# Patient Record
Sex: Female | Born: 1945 | Race: White | Hispanic: No | Marital: Married | Smoking: Never smoker
Health system: Southern US, Community
[De-identification: ages and names within clinical notes are randomized; demographics above are authoritative.]

## PROBLEM LIST (undated history)

## (undated) DIAGNOSIS — K829 Disease of gallbladder, unspecified: Secondary | ICD-10-CM

## (undated) DIAGNOSIS — T4145XA Adverse effect of unspecified anesthetic, initial encounter: Secondary | ICD-10-CM

## (undated) DIAGNOSIS — T8859XA Other complications of anesthesia, initial encounter: Secondary | ICD-10-CM

## (undated) DIAGNOSIS — R351 Nocturia: Secondary | ICD-10-CM

## (undated) DIAGNOSIS — K5790 Diverticulosis of intestine, part unspecified, without perforation or abscess without bleeding: Secondary | ICD-10-CM

## (undated) DIAGNOSIS — M2550 Pain in unspecified joint: Secondary | ICD-10-CM

## (undated) DIAGNOSIS — I480 Paroxysmal atrial fibrillation: Secondary | ICD-10-CM

## (undated) DIAGNOSIS — K5792 Diverticulitis of intestine, part unspecified, without perforation or abscess without bleeding: Secondary | ICD-10-CM

## (undated) DIAGNOSIS — O24419 Gestational diabetes mellitus in pregnancy, unspecified control: Secondary | ICD-10-CM

## (undated) DIAGNOSIS — IMO0002 Reserved for concepts with insufficient information to code with codable children: Secondary | ICD-10-CM

## (undated) DIAGNOSIS — M1990 Unspecified osteoarthritis, unspecified site: Secondary | ICD-10-CM

## (undated) DIAGNOSIS — H409 Unspecified glaucoma: Secondary | ICD-10-CM

## (undated) DIAGNOSIS — I82409 Acute embolism and thrombosis of unspecified deep veins of unspecified lower extremity: Secondary | ICD-10-CM

## (undated) DIAGNOSIS — E7800 Pure hypercholesterolemia, unspecified: Secondary | ICD-10-CM

## (undated) DIAGNOSIS — R7303 Prediabetes: Secondary | ICD-10-CM

## (undated) DIAGNOSIS — Z9889 Other specified postprocedural states: Secondary | ICD-10-CM

## (undated) DIAGNOSIS — K649 Unspecified hemorrhoids: Secondary | ICD-10-CM

## (undated) DIAGNOSIS — Z8632 Personal history of gestational diabetes: Secondary | ICD-10-CM

## (undated) DIAGNOSIS — R112 Nausea with vomiting, unspecified: Secondary | ICD-10-CM

## (undated) DIAGNOSIS — I2510 Atherosclerotic heart disease of native coronary artery without angina pectoris: Secondary | ICD-10-CM

## (undated) DIAGNOSIS — H9190 Unspecified hearing loss, unspecified ear: Secondary | ICD-10-CM

## (undated) DIAGNOSIS — E739 Lactose intolerance, unspecified: Secondary | ICD-10-CM

## (undated) HISTORY — DX: Disease of gallbladder, unspecified: K82.9

## (undated) HISTORY — PX: EYE SURGERY: SHX253

## (undated) HISTORY — PX: GLAUCOMA SURGERY: SHX656

## (undated) HISTORY — PX: OTHER SURGICAL HISTORY: SHX169

## (undated) HISTORY — DX: Lactose intolerance, unspecified: E73.9

## (undated) HISTORY — DX: Unspecified hearing loss, unspecified ear: H91.90

## (undated) HISTORY — DX: Paroxysmal atrial fibrillation: I48.0

## (undated) HISTORY — DX: Prediabetes: R73.03

## (undated) HISTORY — DX: Diverticulitis of intestine, part unspecified, without perforation or abscess without bleeding: K57.92

## (undated) HISTORY — PX: BLADDER SURGERY: SHX569

## (undated) HISTORY — DX: Atherosclerotic heart disease of native coronary artery without angina pectoris: I25.10

## (undated) HISTORY — DX: Pain in unspecified joint: M25.50

## (undated) HISTORY — DX: Gestational diabetes mellitus in pregnancy, unspecified control: O24.419

## (undated) HISTORY — DX: Pure hypercholesterolemia, unspecified: E78.00

## (undated) HISTORY — DX: Unspecified osteoarthritis, unspecified site: M19.90

---

## 1999-09-26 HISTORY — PX: BREAST BIOPSY: SHX20

## 2002-09-25 HISTORY — PX: OTHER SURGICAL HISTORY: SHX169

## 2003-03-16 ENCOUNTER — Encounter: Payer: Self-pay | Admitting: Family Medicine

## 2003-03-16 ENCOUNTER — Encounter: Admission: RE | Admit: 2003-03-16 | Discharge: 2003-03-16 | Payer: Self-pay | Admitting: Family Medicine

## 2003-09-16 ENCOUNTER — Encounter: Admission: RE | Admit: 2003-09-16 | Discharge: 2003-12-15 | Payer: Self-pay | Admitting: Family Medicine

## 2004-07-19 ENCOUNTER — Encounter: Admission: RE | Admit: 2004-07-19 | Discharge: 2004-07-19 | Payer: Self-pay | Admitting: Family Medicine

## 2004-10-18 ENCOUNTER — Encounter: Admission: RE | Admit: 2004-10-18 | Discharge: 2005-01-16 | Payer: Self-pay | Admitting: Family Medicine

## 2005-02-14 ENCOUNTER — Encounter: Admission: RE | Admit: 2005-02-14 | Discharge: 2005-05-15 | Payer: Self-pay | Admitting: Family Medicine

## 2005-06-23 ENCOUNTER — Encounter: Admission: RE | Admit: 2005-06-23 | Discharge: 2005-09-21 | Payer: Self-pay | Admitting: Family Medicine

## 2005-08-09 ENCOUNTER — Other Ambulatory Visit: Admission: RE | Admit: 2005-08-09 | Discharge: 2005-08-09 | Payer: Self-pay | Admitting: Family Medicine

## 2005-11-15 ENCOUNTER — Other Ambulatory Visit: Admission: RE | Admit: 2005-11-15 | Discharge: 2005-11-15 | Payer: Self-pay | Admitting: Diagnostic Radiology

## 2005-11-30 ENCOUNTER — Inpatient Hospital Stay (HOSPITAL_COMMUNITY): Admission: RE | Admit: 2005-11-30 | Discharge: 2005-12-01 | Payer: Self-pay | Admitting: Neurological Surgery

## 2007-08-12 ENCOUNTER — Other Ambulatory Visit: Admission: RE | Admit: 2007-08-12 | Discharge: 2007-08-12 | Payer: Self-pay | Admitting: Family Medicine

## 2008-05-20 ENCOUNTER — Encounter: Admission: RE | Admit: 2008-05-20 | Discharge: 2008-06-03 | Payer: Self-pay | Admitting: Family Medicine

## 2008-06-02 ENCOUNTER — Emergency Department (HOSPITAL_BASED_OUTPATIENT_CLINIC_OR_DEPARTMENT_OTHER): Admission: EM | Admit: 2008-06-02 | Discharge: 2008-06-02 | Payer: Self-pay | Admitting: Emergency Medicine

## 2010-02-28 ENCOUNTER — Encounter: Admission: RE | Admit: 2010-02-28 | Discharge: 2010-02-28 | Payer: Self-pay | Admitting: Family Medicine

## 2010-03-23 ENCOUNTER — Other Ambulatory Visit: Admission: RE | Admit: 2010-03-23 | Discharge: 2010-03-23 | Payer: Self-pay | Admitting: Diagnostic Radiology

## 2010-03-23 ENCOUNTER — Encounter: Admission: RE | Admit: 2010-03-23 | Discharge: 2010-03-23 | Payer: Self-pay | Admitting: Internal Medicine

## 2010-09-22 ENCOUNTER — Encounter
Admission: RE | Admit: 2010-09-22 | Discharge: 2010-09-22 | Payer: Self-pay | Source: Home / Self Care | Attending: Internal Medicine | Admitting: Internal Medicine

## 2010-10-16 ENCOUNTER — Encounter: Payer: Self-pay | Admitting: Family Medicine

## 2011-02-10 NOTE — Op Note (Signed)
NAMEBONNEE, Maureen Elliott             ACCOUNT NO.:  [id number]   MEDICAL RECORD NO.:  [id number]          PATIENT TYPE:  INP   LOCATION:  3016                         FACILITY:  [facility]   PHYSICIAN:  Maureen Elliott, M.D.  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   OPERATION:  Anterior cervical decompression C5-6 and C6-7, strut arthrodesis  with structural cage of PEEK with allograft, Alphatec plate fixation.   SURGEON:  Maureen Elliott, M.D.   FIRST ASSISTANT:  Coletta Memos, M.D.   ANESTHESIA:  General endotracheal.   INDICATIONS:  Maureen Elliott is a 65 year old individual who has had  significant neck, shoulder, and left arm pain.  She has failed efforts at  conservative management and over the passage of time has had persistent  worsening symptoms of the neck, shoulder, left arm. She has been advised  regarding surgical decompression and arthrodesis at the C5-C6 level where  she has substantial spondylitic changes  and laterally herniated disc at C6-  C7 level.   PROCEDURE:  The patient was brought to the operating room, placed on table  supine position. After the smooth induction of general endotracheal  anesthesia, she was placed in 5 pounds of halter traction. The neck was  placed in a slightly extended position with appropriate padding to all bony  prominences and the arms were at the side. The neck was prepped with  DuraPrep and draped in sterile fashion. A transverse incision was made in  the left sided neck. This was carried down through the platysma.  The plane  between the sternocleidomastoid and strap muscles was dissected bluntly  until the prevertebral space was reached.  The first  identifiable disc space  was noted to be that of C6-C7 with a localizing radiograph.  Dissection was  then carried slightly cephalad to expose C5-C6 and the longus coli muscle  was then dissected from either side of the midline structures so as to allow  placement of a self-retaining retractor of the Caspar type.  With the  retractor in place, then, attention was turned first to C5-C6 which was the  worst of the two levels and the disc space was opened with 15 blade.  Calcified anterior longitudinal ligament was encountered and this was  removed in a piecemeal fashion. The disc was markedly degenerated and  desiccated and this was removed using a combination of curets and rongeurs  until the region of posterior longitudinal ligament was reached. A  substantial osteophyte from the inferior margin body of C5 was encountered  and this was drilled down with a 2.3 mm dissecting tool and high speed  drill. The portions of the osteophyte were removed and the dissection was  then carried out to the lateral gutters, particularly on the left side where  there was noted be calcification in the posterior longitudinal ligament  itself. This was taken up carefully with first a 1 mm Kerrison punch and  then a 2 mm Kerrison punch which could be slipped under the posterior  longitudinal ligament to clear the prevertebral space. Dissection was  carried out all the way into the lateral recess following the path of the C6  nerve root. Care was taken to decompress this and epidural venous bleeding  was controlled with bipolar cautery and some very small pledgets of Gelfoam  soaked in thrombin, which were later irrigated away. Minimal decompression  was performed on the right side as this was completely asymptomatic. Once  this was accomplished, care was taken to make sure that both endplates were  decorticated adequately and no remnants of cartilaginous material was  adherent to then.  Then, attention was  turned to C6-C7 where a similar  procedure was carried out. Here, there was noted be a chronically herniated  disc in the left lateral recess with small bony cover, this was taken out on  the left side, the C7 nerve root was decompressed in a similar fashion.  Again, on the right side a minimal decompression was performed. With the  disc space being completely evacuated and the prevertebral space being  decompressed well, hemostasis was achieved from all the epidural spaces and  then the interspaces were sized for a PEEK spacer. A 6 mm PEEK spacer was  used at each of these sites. This was of medium sized.  It was packed with  demineralized bone matrix and this was placed into the interspace at C6-C7  first and then at C5-C6 next.  A singular localizing radiograph identified  the position of the grafts, they were repositioned more adequately to be  central and filling the center part of the interspace and then a 37 mm  standard size Alphatec plate was fitted to the ventral aspect of the  vertebral bodies with six locking 4 x 14 mm screws.  The screws in C6 were  fixed angle and variable angle screws were used in C5 and C7. In the end,  hemostasis in the wound was obtained meticulously in all the soft tissues.  Once this was verified, the platysma was closed with 3-0 Vicryl in  interrupted fashion, 3-0 Vicryl was used in the subcuticular tissues.  Dermabond was placed on the skin. The patient tolerated procedure well and  was returned to recovery in stable condition.      Maureen Elliott, M.D.  Electronically Signed     HJE/MEDQ  D:  11/30/2005  T:  11/30/2005  Job:  [id number]

## 2011-09-26 DIAGNOSIS — Z9889 Other specified postprocedural states: Secondary | ICD-10-CM

## 2011-09-26 DIAGNOSIS — R112 Nausea with vomiting, unspecified: Secondary | ICD-10-CM

## 2011-09-26 HISTORY — PX: ABDOMINAL HYSTERECTOMY: SHX81

## 2011-09-26 HISTORY — DX: Nausea with vomiting, unspecified: R11.2

## 2011-09-26 HISTORY — DX: Other specified postprocedural states: Z98.890

## 2011-09-29 ENCOUNTER — Other Ambulatory Visit: Payer: Self-pay | Admitting: Internal Medicine

## 2011-09-29 DIAGNOSIS — E042 Nontoxic multinodular goiter: Secondary | ICD-10-CM

## 2011-10-09 ENCOUNTER — Other Ambulatory Visit: Payer: Self-pay

## 2011-10-18 ENCOUNTER — Ambulatory Visit
Admission: RE | Admit: 2011-10-18 | Discharge: 2011-10-18 | Disposition: A | Discharge status: Home / Self Care | Payer: BC Managed Care – PPO | Source: Ambulatory Visit | Attending: Internal Medicine | Admitting: Internal Medicine

## 2011-10-18 DIAGNOSIS — E042 Nontoxic multinodular goiter: Secondary | ICD-10-CM

## 2013-10-26 HISTORY — PX: OTHER SURGICAL HISTORY: SHX169

## 2013-12-08 ENCOUNTER — Other Ambulatory Visit: Payer: Self-pay | Admitting: Urology

## 2013-12-25 ENCOUNTER — Encounter (HOSPITAL_COMMUNITY): Payer: Self-pay | Admitting: Pharmacy Technician

## 2014-01-02 NOTE — Patient Instructions (Addendum)
20 Marliss CootsLinda L Crall  01/02/2014   Your procedure is scheduled on: Tuesday April 21st, 2015  Report to [facility] at  530AM.  Call this number if you have problems the morning of surgery [phone]   Remember: short stay will draw your blood  type morning of surgery.  Do not eat food or drink liquids :After Midnight.     Take these medicines the morning of surgery with A SIP OF WATER:  No meds to take                               You may not have any metal on your body including hair pins and piercings  Do not wear jewelry, make-up, lotions, powders, or deodorant.   Men may shave face and neck.  Do not bring valuables to the hospital. [facility] IS NOT RESPONSIBLE FOR VALUABLES.  Contacts, dentures or bridgework may not be worn into surgery.  Leave suitcase in the car. After surgery it may be brought to your room.  For patients admitted to the hospital, checkout time is 11:00 AM the day of discharge.   Patients discharged the day of surgery will not be allowed to drive home.  Name and phone number of your driver:  Special Instructions: N/A  [facility] - Preparing for Surgery Before surgery, you can play an important role.  Because skin is not sterile, your skin needs to be as free of germs as possible.  You can reduce the number of germs on your skin by washing with CHG (chlorahexidine gluconate) soap before surgery.  CHG is an antiseptic cleaner which kills germs and bonds with the skin to continue killing germs even after washing. Please DO NOT use if you have an allergy to CHG or antibacterial soaps.  If your skin becomes reddened/irritated stop using the CHG and inform your nurse when you arrive at Short Stay. Do not shave (including legs and underarms) for at least 48 hours prior to the first CHG shower.  You may shave your face. Please follow these instructions carefully:  1.  Shower with CHG Soap the night before surgery and the  morning of Surgery.  2.   If you choose to wash your hair, wash your hair first as usual with your  normal  shampoo.  3.  After you shampoo, rinse your hair and body thoroughly to remove the  shampoo.                           4.  Use CHG as you would any other liquid soap.  You can apply chg directly  to the skin and wash                       Gently with a scrungie or clean washcloth.  5.  Apply the CHG Soap to your body ONLY FROM THE NECK DOWN.   Do not use on open                           Wound or open sores. Avoid contact with eyes, ears mouth and genitals (private parts).                        Genitals (private parts) with your normal soap.  6.  Wash thoroughly, paying special attention to the area where your surgery  will be performed.  7.  Thoroughly rinse your body with warm water from the neck down.  8.  DO NOT shower/wash with your normal soap after using and rinsing off  the CHG Soap.                9.  Pat yourself dry with a clean towel.            10.  Wear clean pajamas.            11.  Place clean sheets on your bed the night of your first shower and do not  sleep with pets. Day of Surgery : Do not apply any lotions/deodorants the morning of surgery.  Please wear clean clothes to the hospital/surgery center.  FAILURE TO FOLLOW THESE INSTRUCTIONS MAY RESULT IN THE CANCELLATION OF YOUR SURGERY PATIENT SIGNATURE_________________________________  NURSE SIGNATURE__________________________________  WHAT IS A BLOOD TRANSFUSION? Blood Transfusion Information  A transfusion is the replacement of blood or some of its parts. Blood is made up of multiple cells which provide different functions.  Red blood cells carry oxygen and are used for blood loss replacement.  White blood cells fight against infection.  Platelets control bleeding.  Plasma helps clot blood.  Other blood products are available for specialized needs, such as hemophilia or other clotting disorders. BEFORE THE TRANSFUSION   Who gives blood for transfusions?   Healthy volunteers who are fully evaluated to make sure their blood is safe. This is blood bank blood. Transfusion therapy is the safest it has ever been in the practice of medicine. Before blood is taken from a donor, a complete history is taken to make sure that person has no history of diseases nor engages in risky social behavior (examples are intravenous drug use or sexual activity with multiple partners). The donor's travel history is screened to minimize risk of transmitting infections, such as malaria. The donated blood is tested for signs of infectious diseases, such as HIV and hepatitis. The blood is then tested to be sure it is compatible with you in order to minimize the chance of a transfusion reaction. If you or a relative donates blood, this is often done in anticipation of surgery and is not appropriate for emergency situations. It takes many days to process the donated blood. RISKS AND COMPLICATIONS Although transfusion therapy is very safe and saves many lives, the main dangers of transfusion include:   Getting an infectious disease.  Developing a transfusion reaction. This is an allergic reaction to something in the blood you were given. Every precaution is taken to prevent this. The decision to have a blood transfusion has been considered carefully by your caregiver before blood is given. Blood is not given unless the benefits outweigh the risks. AFTER THE TRANSFUSION  Right after receiving a blood transfusion, you will usually feel much better and more energetic. This is especially true if your red blood cells have gotten low (anemic). The transfusion raises the level of the red blood cells which carry oxygen, and this usually causes an energy increase.  The nurse administering the transfusion will monitor you carefully for complications. HOME CARE INSTRUCTIONS  No special instructions are needed after a transfusion. You may find your energy  is better. Speak with your caregiver about any limitations on activity for underlying diseases you may have. SEEK MEDICAL CARE IF:   Your condition is not improving after your transfusion.  You develop redness or irritation at the intravenous (IV) site. SEEK IMMEDIATE MEDICAL CARE IF:  Any of the following symptoms occur over the next 12 hours:  Shaking chills.  You have a temperature by mouth above 102 F (38.9 C), not controlled by medicine.  Chest, back, or muscle pain.  People around you feel you are not acting correctly or are confused.  Shortness of breath or difficulty breathing.  Dizziness and fainting.  You get a rash or develop hives.  You have a decrease in urine output.  Your urine turns a dark color or changes to pink, red, or brown. Any of the following symptoms occur over the next 10 days:  You have a temperature by mouth above 102 F (38.9 C), not controlled by medicine.  Shortness of breath.  Weakness after normal activity.  The white part of the eye turns yellow (jaundice).  You have a decrease in the amount of urine or are urinating less often.  Your urine turns a dark color or changes to pink, red, or brown. Document Released: 09/08/2000 Document Revised: 12/04/2011 Document Reviewed: 04/27/2008 [facility] Patient Information 2014 [place], [place].

## 2014-01-05 ENCOUNTER — Encounter (HOSPITAL_COMMUNITY)
Admission: RE | Admit: 2014-01-05 | Discharge: 2014-01-05 | Disposition: A | Discharge status: Home / Self Care | Payer: Medicare Other | Source: Ambulatory Visit | Attending: Urology | Admitting: Urology

## 2014-01-05 ENCOUNTER — Encounter (INDEPENDENT_AMBULATORY_CARE_PROVIDER_SITE_OTHER): Payer: Self-pay

## 2014-01-05 ENCOUNTER — Encounter (HOSPITAL_COMMUNITY): Payer: Self-pay

## 2014-01-05 DIAGNOSIS — Z01812 Encounter for preprocedural laboratory examination: Secondary | ICD-10-CM | POA: Insufficient documentation

## 2014-01-05 DIAGNOSIS — N8111 Cystocele, midline: Secondary | ICD-10-CM | POA: Insufficient documentation

## 2014-01-05 HISTORY — DX: Nausea with vomiting, unspecified: R11.2

## 2014-01-05 HISTORY — DX: Other complications of anesthesia, initial encounter: T88.59XA

## 2014-01-05 HISTORY — DX: Other specified postprocedural states: Z98.890

## 2014-01-05 HISTORY — DX: Unspecified hemorrhoids: K64.9

## 2014-01-05 HISTORY — DX: Nocturia: R35.1

## 2014-01-05 HISTORY — DX: Adverse effect of unspecified anesthetic, initial encounter: T41.45XA

## 2014-01-05 HISTORY — DX: Reserved for concepts with insufficient information to code with codable children: IMO0002

## 2014-01-05 HISTORY — DX: Diverticulosis of intestine, part unspecified, without perforation or abscess without bleeding: K57.90

## 2014-01-05 HISTORY — DX: Personal history of gestational diabetes: Z86.32

## 2014-01-05 LAB — CBC
HCT: 39 % (ref 36.0–46.0)
Hemoglobin: 12.9 g/dL (ref 12.0–15.0)
MCH: 30 pg (ref 26.0–34.0)
MCHC: 33.1 g/dL (ref 30.0–36.0)
MCV: 90.7 fL (ref 78.0–100.0)
Platelets: 234 10*3/uL (ref 150–400)
RBC: 4.3 MIL/uL (ref 3.87–5.11)
RDW: 14.1 % (ref 11.5–15.5)
WBC: 4.3 10*3/uL (ref 4.0–10.5)

## 2014-01-05 LAB — APTT: aPTT: 26 seconds (ref 24–37)

## 2014-01-05 LAB — PROTIME-INR
INR: 0.94 (ref 0.00–1.49)
Prothrombin Time: 12.4 seconds (ref 11.6–15.2)

## 2014-01-12 MED ORDER — GENTAMICIN SULFATE 40 MG/ML IJ SOLN
360.0000 mg | INTRAVENOUS | Status: AC
  Administered 2014-01-13: 360 mg via INTRAVENOUS
  Filled 2014-01-12 (×2): qty 9

## 2014-01-12 NOTE — H&P (Signed)
History of Present Illness   [name] has a symptomatic cystocele. She has milder stress incontinence and urge incontinence, but also has fecal incontinence. She has moderate frequency and mild nocturia. She had a neck injury. She can feel vaginal bulging. She had a hysterectomy 3 years ago. She had a rectocele repair and a cystocele occurred following her hysterectomy. On pelvic examination she had a grade 3 cystocele with central defect with significant loss of apical support. The apex reached the introitus and _____ she lost approximately 4 cm posteriorly. She had no stress incontinence associated with grade 2 hypermobility of the bladder neck. With the vault and cystocele reduced, she had a mild rectocele that was reduced and mild deficiency of the posterior fourchette. She had some mild tenderness and atrophy. Her residual was 122 mL. She has not had a cystoscopy. I thought if she had surgery, she likely best benefit from a transvaginal vault suspension with cystocele repair and graft and I did not think she would require a rectocele repair. Local estrogen replacement therapy would be recommended prior to surgery. She noted that mesh was used for a rectocele repair. She is following up with Dr. Randa EvensEdwards for fecal incontinence.   She did not void and was catheterized for 100 mL. Maximum capacity was 362 mL. Bladder was unstable reaching a pressure of 10 cmH2O, but she did not leak. At 200 mL, she had very mild leakage at 89 cmH2O. It was 79 cmH2O and very mild at 300 mL. This is with the prolapse reduced. The leakage was not impressive. During voluntary voiding, she voided 270 mL with a maximum flow of 21 mL/sec. Maximum voiding pressure was 21 cmH2O. Residual was 90 mL. She said her flow is better with the pack in place. EMG activity increased during the voiding phase. Bladder neck descended 2-3 cm and she had a moderate cystocele as noted, but was reduced. Flow pattern was interrupted and bladder  contraction was reasonably well sustained but overall of lower pressure. Steward DroneBrenda thought the contraction was not that well sustained. The details of the urodynamics are signed and dictated on the urodynamic sheet.   Review of Systems: No other change in bowel or neurologic systems.    Past Medical History Problems  1. History of Rectal bleeding (569.3)  Surgical History Problems  1. History of Abdominoplasty 2. History of Cataract Surgery 3. History of Cholecystectomy 4. History of Hysterectomy 5. History of Knee Arthroscopy 6. History of Oral Surgery 7. History of Rectocele Repair 8. History of Spinal Arthrodesis 9. History of Tonsillectomy  Current Meds 1. Calcium + D TABS;  Therapy: (Recorded:13Jan2015) to Recorded 2. CholestOff TABS;  Therapy: (Recorded:13Jan2015) to Recorded 3. Multi-Vitamin TABS;  Therapy: (Recorded:13Jan2015) to Recorded 4. Vitamin D3 1000 UNIT Oral Capsule;  Therapy: (Recorded:13Jan2015) to Recorded  Allergies Medication  1. Amoxicillin TABS 2. Keflex TABS 3. Levaquin TABS  Family History Problems  1. Family history of alcoholism (V17.0) : Father 2. Family history of colon cancer (V16.0) : Uncle 3. Family history of lung cancer (V16.1) : Father 4. Family history of malignant neoplasm of breast (V16.3) : Aunt 5. Family history of malignant neoplasm of stomach (V16.0) : Maternal Grandmother 6. Family history of ovarian cancer (V16.41) : Mother 7. Family history of stroke (V17.1) : Daughter, Paternal Grandmother 8. Family history of S/P triple vessel bypass : Brother  Social History Problems  1. Alcohol use   occasional - one per week 2. Daily caffeine consumption, 4-5 servings a day 3.  Death in the family, father   age 67 due to lung cancer and alcoholism 4. Death in the family, mother   age 67 due to old age and dementia 5. Married 6. Non-smoker (V49.89) 7. One child   daughter 338. Retired   Solicitormedical assistant  instructor  Assessment Assessed  1. Incomplete bladder emptying (788.21) 2. Cystocele, midline (618.01)  Plan Cystocele, midline  1. Follow-up Schedule Surgery Office  Follow-up  Status: Complete  Done: 11Feb2015 Nocturia  2. Start: Premarin 0.625 MG/GM Vaginal Cream; one gram 3x/week x 4 weeks; then once  weekly;  Discussion/Summary   I drew [name] a picture. I reviewed watchful waiting versus pessary versus a transvaginal vault suspension with cystocele repair and graft.  I drew her a picture and we talked about prolapse surgery in detail. Pros, cons, general surgical and anesthetic risks, and other options including behavioral therapy, pessaries, and watchful waiting were discussed. She understands that prolapse repairs are successful in 80-85% of cases for prolapse symptoms and can recur anteriorly, posteriorly, and/or apically. She understands that in most cases I use a graft and general risks were discussed. Surgical risks were described but not limited to the discussion of injury to neighboring structures including the bowel (with possible life-threatening sepsis and colostomy), bladder, urethra, vagina (all resulting in further surgery), and ureter (resulting in re-implantation). We talked about injury to nerves/soft tissue leading to debilitating and intractable pelvic, abdominal, and lower extremity pain syndromes and neuropathies. The risks of buttock pain, intractable dyspareunia, and vaginal narrowing and shortening with sequelae were discussed. Bleeding risks, transfusion rates, and infection were discussed. The risk of persistent, de novo, or worsening bladder and/or bowel incontinence/dysfunction was discussed. The need for CIC was described as well the usual post-operative course. The patient understands that she might not reach her treatment goal and that she might be worse following surgery.  Mesh issues discussed.  I talked to her about watchful waiting versus  physical therapy versus sling for her mild mixed stress urge incontinence.   We talked about a sling in detail. Pros, cons, general surgical and anesthetic risks, and other options including behavioral therapy and watchful waiting were discussed. She understands that slings are generally successful in 90% of cases for stress incontinence, 50% for urge incontinence, and that in a small percentage of cases the incontinence can worsen. The risk of persistent, de novo, or worsening incontinence/dysfunction was discussed. Risks were described but not limited to the discussion of injury to neighboring structures including the bowel (with possible life-threatening sepsis and colostomy), bladder, urethra, vagina (all resulting in further surgery), and ureter (resulting in re-implantation). We also talked about the risk of retention requiring urethrolysis, extrusion requiring revision, and erosion resulting in further surgery. Bleeding risks and transfusion rates and the risk of infection were discussed. The risk of pelvic and abdominal pain syndromes, dyspareunia, and neuropathies were discussed. The need for CIC was described as well as the usual postoperative course. The patient understands that she might not reach her treatment goal and that she might be worse following surgery. Mesh TV issues were discussed.  Mesh issues discussed.  Treatment goals were ____ that I recommended not to have surgery if her primary complaint was incontinence, since I would try to help her with medical and behavioral therapy and/or physical therapy first.   [name] was cleared with a colonoscopy by Dr. Randa EvensEdwards. He thinks she has low sphincter tone and wants to see her post prolapse surgery.  Theoretical slight  increased risk of retention was also discussed.   She would like to proceed with prolapse surgery and a sling.   Estrace prescription given.   [name] has had breast cancer in the past but has been cleared  for Premarin cream before. She will use it twice a week until surgery.  After a thorough review of the management options for the patient's condition the patient  elected to proceed with surgical therapy as noted above. We have discussed the potential benefits and risks of the procedure, side effects of the proposed treatment, the likelihood of the patient achieving the goals of the procedure, and any potential problems that might occur during the procedure or recuperation. Informed consent has been obtained.

## 2014-01-13 ENCOUNTER — Encounter (HOSPITAL_COMMUNITY): Payer: Self-pay | Admitting: *Deleted

## 2014-01-13 ENCOUNTER — Encounter (HOSPITAL_COMMUNITY): Payer: Medicare Other | Admitting: Registered Nurse

## 2014-01-13 ENCOUNTER — Observation Stay (HOSPITAL_COMMUNITY)
Admission: RE | Admit: 2014-01-13 | Discharge: 2014-01-14 | Disposition: A | Discharge status: Home / Self Care | Payer: Medicare Other | Source: Ambulatory Visit | Attending: Urology | Admitting: Urology

## 2014-01-13 ENCOUNTER — Ambulatory Visit (HOSPITAL_COMMUNITY): Payer: Medicare Other | Admitting: Registered Nurse

## 2014-01-13 ENCOUNTER — Encounter (HOSPITAL_COMMUNITY)
Admission: RE | Disposition: A | Discharge status: Home / Self Care | Payer: Self-pay | Source: Ambulatory Visit | Attending: Urology

## 2014-01-13 DIAGNOSIS — R159 Full incontinence of feces: Secondary | ICD-10-CM | POA: Insufficient documentation

## 2014-01-13 DIAGNOSIS — N3946 Mixed incontinence: Secondary | ICD-10-CM | POA: Insufficient documentation

## 2014-01-13 DIAGNOSIS — IMO0002 Reserved for concepts with insufficient information to code with codable children: Secondary | ICD-10-CM | POA: Diagnosis present

## 2014-01-13 DIAGNOSIS — Z9089 Acquired absence of other organs: Secondary | ICD-10-CM | POA: Insufficient documentation

## 2014-01-13 DIAGNOSIS — N993 Prolapse of vaginal vault after hysterectomy: Principal | ICD-10-CM | POA: Insufficient documentation

## 2014-01-13 DIAGNOSIS — R339 Retention of urine, unspecified: Secondary | ICD-10-CM | POA: Insufficient documentation

## 2014-01-13 HISTORY — PX: CYSTOCELE REPAIR: SHX163

## 2014-01-13 LAB — TYPE AND SCREEN
ABO/RH(D): AB POS
Antibody Screen: NEGATIVE

## 2014-01-13 LAB — HEMOGLOBIN AND HEMATOCRIT, BLOOD
HCT: 34.8 % — ABNORMAL LOW (ref 36.0–46.0)
Hemoglobin: 11.7 g/dL — ABNORMAL LOW (ref 12.0–15.0)

## 2014-01-13 LAB — ABO/RH: ABO/RH(D): AB POS

## 2014-01-13 SURGERY — COLPORRHAPHY, ANTERIOR, FOR CYSTOCELE REPAIR
Anesthesia: General

## 2014-01-13 MED ORDER — PROMETHAZINE HCL 25 MG/ML IJ SOLN
INTRAMUSCULAR | Status: AC
  Filled 2014-01-13: qty 1

## 2014-01-13 MED ORDER — METRONIDAZOLE IN NACL 5-0.79 MG/ML-% IV SOLN
INTRAVENOUS | Status: AC
  Filled 2014-01-13: qty 100

## 2014-01-13 MED ORDER — HYDROCODONE-ACETAMINOPHEN 5-325 MG PO TABS: 1.0000 | ORAL_TABLET | ORAL | Status: DC | PRN

## 2014-01-13 MED ORDER — LACTATED RINGERS IV SOLN
INTRAVENOUS | Status: DC | PRN
  Administered 2014-01-13: 10:00:00 via INTRAVENOUS
  Administered 2014-01-13: 1000 mL
  Administered 2014-01-13 (×2): via INTRAVENOUS

## 2014-01-13 MED ORDER — METRONIDAZOLE IN NACL 5-0.79 MG/ML-% IV SOLN
500.0000 mg | Freq: Three times a day (TID) | INTRAVENOUS | Status: DC
  Administered 2014-01-13: .5 g via INTRAVENOUS

## 2014-01-13 MED ORDER — DEXAMETHASONE SODIUM PHOSPHATE 10 MG/ML IJ SOLN
INTRAMUSCULAR | Status: DC | PRN
  Administered 2014-01-13: 10 mg via INTRAVENOUS

## 2014-01-13 MED ORDER — ESTRADIOL 0.1 MG/GM VA CREA
TOPICAL_CREAM | VAGINAL | Status: DC | PRN
  Administered 2014-01-13: 1 via VAGINAL

## 2014-01-13 MED ORDER — FENTANYL CITRATE 0.05 MG/ML IJ SOLN: 25.0000 ug | INTRAMUSCULAR | Status: DC | PRN

## 2014-01-13 MED ORDER — LIDOCAINE-EPINEPHRINE (PF) 1 %-1:200000 IJ SOLN
INTRAMUSCULAR | Status: DC | PRN
  Administered 2014-01-13: 20 mL

## 2014-01-13 MED ORDER — ACETAMINOPHEN 10 MG/ML IV SOLN
INTRAVENOUS | Status: DC | PRN
  Administered 2014-01-13: 1000 mg via INTRAVENOUS

## 2014-01-13 MED ORDER — HYDROCODONE-ACETAMINOPHEN 5-325 MG PO TABS: 1.0000 | ORAL_TABLET | Freq: Four times a day (QID) | ORAL | Status: DC | PRN

## 2014-01-13 MED ORDER — DEXTROSE 10 % IV SOLN
Freq: Once | INTRAVENOUS | Status: DC
Start: 2014-01-13 — End: 2014-01-13
  Filled 2014-01-13: qty 1000

## 2014-01-13 MED ORDER — ROCURONIUM BROMIDE 100 MG/10ML IV SOLN
INTRAVENOUS | Status: DC | PRN
  Administered 2014-01-13: 5 mg via INTRAVENOUS

## 2014-01-13 MED ORDER — MIDAZOLAM HCL 5 MG/5ML IJ SOLN
INTRAMUSCULAR | Status: DC | PRN
  Administered 2014-01-13: 2 mg via INTRAVENOUS

## 2014-01-13 MED ORDER — PROPOFOL 10 MG/ML IV BOLUS
INTRAVENOUS | Status: DC | PRN
  Administered 2014-01-13: 40 mg via INTRAVENOUS
  Administered 2014-01-13: 160 mg via INTRAVENOUS

## 2014-01-13 MED ORDER — FENTANYL CITRATE 0.05 MG/ML IJ SOLN
INTRAMUSCULAR | Status: DC | PRN
Start: 2014-01-13 — End: 2014-01-13
  Administered 2014-01-13: 25 ug via INTRAVENOUS
  Administered 2014-01-13 (×4): 100 ug via INTRAVENOUS
  Administered 2014-01-13: 25 ug via INTRAVENOUS

## 2014-01-13 MED ORDER — FENTANYL CITRATE 0.05 MG/ML IJ SOLN
INTRAMUSCULAR | Status: AC
  Filled 2014-01-13: qty 5

## 2014-01-13 MED ORDER — MORPHINE SULFATE 2 MG/ML IJ SOLN: 2.0000 mg | INTRAMUSCULAR | Status: DC | PRN

## 2014-01-13 MED ORDER — DEXTROSE-NACL 5-0.45 % IV SOLN
INTRAVENOUS | Status: DC
  Administered 2014-01-13 – 2014-01-14 (×3): via INTRAVENOUS

## 2014-01-13 MED ORDER — ACETAMINOPHEN 325 MG PO TABS: 650.0000 mg | ORAL_TABLET | ORAL | Status: DC | PRN

## 2014-01-13 MED ORDER — HYDROMORPHONE HCL PF 1 MG/ML IJ SOLN
INTRAMUSCULAR | Status: AC
  Filled 2014-01-13: qty 1

## 2014-01-13 MED ORDER — PROMETHAZINE HCL 25 MG/ML IJ SOLN
INTRAMUSCULAR | Status: DC | PRN
  Administered 2014-01-13: 6.25 mg via INTRAVENOUS

## 2014-01-13 MED ORDER — ESTRADIOL 0.1 MG/GM VA CREA
TOPICAL_CREAM | VAGINAL | Status: AC
  Filled 2014-01-13: qty 85

## 2014-01-13 MED ORDER — EPHEDRINE SULFATE 50 MG/ML IJ SOLN
INTRAMUSCULAR | Status: DC | PRN
  Administered 2014-01-13: 5 mg via INTRAVENOUS

## 2014-01-13 MED ORDER — ONDANSETRON HCL 4 MG/2ML IJ SOLN
INTRAMUSCULAR | Status: DC | PRN
  Administered 2014-01-13: 4 mg via INTRAVENOUS

## 2014-01-13 MED ORDER — ONDANSETRON HCL 4 MG/2ML IJ SOLN: 4.0000 mg | INTRAMUSCULAR | Status: DC | PRN

## 2014-01-13 MED ORDER — LACTATED RINGERS IV SOLN
INTRAVENOUS | Status: DC
  Administered 2014-01-13: 11:00:00 via INTRAVENOUS

## 2014-01-13 MED ORDER — SODIUM CHLORIDE 0.9 % IR SOLN
Status: DC | PRN
  Administered 2014-01-13: 09:00:00

## 2014-01-13 MED ORDER — ONDANSETRON HCL 4 MG/2ML IJ SOLN
INTRAMUSCULAR | Status: AC
  Filled 2014-01-13: qty 2

## 2014-01-13 MED ORDER — PROPOFOL 10 MG/ML IV BOLUS
INTRAVENOUS | Status: AC
  Filled 2014-01-13: qty 20

## 2014-01-13 MED ORDER — METHYLENE BLUE 1 % INJ SOLN
INTRAMUSCULAR | Status: DC | PRN
Start: 2014-01-13 — End: 2014-01-13
  Administered 2014-01-13: 10 mL via INTRAVENOUS

## 2014-01-13 MED ORDER — DEXAMETHASONE SODIUM PHOSPHATE 10 MG/ML IJ SOLN
INTRAMUSCULAR | Status: AC
  Filled 2014-01-13: qty 1

## 2014-01-13 MED ORDER — MIDAZOLAM HCL 2 MG/2ML IJ SOLN
INTRAMUSCULAR | Status: AC
  Filled 2014-01-13: qty 2

## 2014-01-13 MED ORDER — METHYLENE BLUE 1 % INJ SOLN
INTRAMUSCULAR | Status: AC
  Filled 2014-01-13: qty 10

## 2014-01-13 MED ORDER — SUCCINYLCHOLINE CHLORIDE 20 MG/ML IJ SOLN
INTRAMUSCULAR | Status: DC | PRN
  Administered 2014-01-13: 100 mg via INTRAVENOUS

## 2014-01-13 MED ORDER — ROCURONIUM BROMIDE 100 MG/10ML IV SOLN
INTRAVENOUS | Status: AC
  Filled 2014-01-13: qty 1

## 2014-01-13 MED ORDER — ACETAMINOPHEN 10 MG/ML IV SOLN
1000.0000 mg | Freq: Once | INTRAVENOUS | Status: DC
  Filled 2014-01-13: qty 100

## 2014-01-13 MED ORDER — LIDOCAINE HCL (CARDIAC) 20 MG/ML IV SOLN
INTRAVENOUS | Status: DC | PRN
Start: 2014-01-13 — End: 2014-01-13
  Administered 2014-01-13: 75 mg via INTRAVENOUS
  Administered 2014-01-13: 25 mg via INTRATRACHEAL

## 2014-01-13 MED ORDER — MEPERIDINE HCL 50 MG/ML IJ SOLN: 6.2500 mg | INTRAMUSCULAR | Status: DC | PRN

## 2014-01-13 MED ORDER — PROMETHAZINE HCL 25 MG/ML IJ SOLN: 6.2500 mg | INTRAMUSCULAR | Status: DC | PRN

## 2014-01-13 MED ORDER — HYDROMORPHONE HCL PF 1 MG/ML IJ SOLN
0.2500 mg | INTRAMUSCULAR | Status: DC | PRN
  Administered 2014-01-13: 0.5 mg via INTRAVENOUS

## 2014-01-13 SURGICAL SUPPLY — 52 items
BAG URINE DRAINAGE (UROLOGICAL SUPPLIES) ×3 IMPLANT
BLADE HEX COATED 2.75 (ELECTRODE) ×3 IMPLANT
BLADE SURG 15 STRL LF DISP TIS (BLADE) ×2 IMPLANT
BLADE SURG 15 STRL SS (BLADE) ×4
CANISTER SUCTION 2500CC (MISCELLANEOUS) ×3 IMPLANT
CATH FOLEY 2WAY SLVR  5CC 14FR (CATHETERS) ×2
CATH FOLEY 2WAY SLVR  5CC 16FR (CATHETERS) ×2
CATH FOLEY 2WAY SLVR 5CC 14FR (CATHETERS) ×1 IMPLANT
CATH FOLEY 2WAY SLVR 5CC 16FR (CATHETERS) ×1 IMPLANT
COVER MAYO STAND STRL (DRAPES) IMPLANT
COVER SURGICAL LIGHT HANDLE (MISCELLANEOUS) ×3 IMPLANT
DERMABOND ADVANCED (GAUZE/BANDAGES/DRESSINGS) ×2
DERMABOND ADVANCED .7 DNX12 (GAUZE/BANDAGES/DRESSINGS) ×1 IMPLANT
DEVICE CAPIO SLIM SINGLE (INSTRUMENTS) ×6 IMPLANT
DRAIN PENROSE 18X1/4 LTX STRL (WOUND CARE) ×3 IMPLANT
GAUZE PACKING 2X5 YD STRL (GAUZE/BANDAGES/DRESSINGS) ×6 IMPLANT
GAUZE SPONGE 4X4 16PLY XRAY LF (GAUZE/BANDAGES/DRESSINGS) ×9 IMPLANT
GLOVE BIOGEL M STRL SZ7.5 (GLOVE) ×6 IMPLANT
GOWN STRL REUS W/TWL XL LVL3 (GOWN DISPOSABLE) ×3 IMPLANT
HOLDER FOLEY CATH W/STRAP (MISCELLANEOUS) ×3 IMPLANT
IV NS 1000ML (IV SOLUTION) ×2
IV NS 1000ML BAXH (IV SOLUTION) ×1 IMPLANT
KIT BASIN OR (CUSTOM PROCEDURE TRAY) ×3 IMPLANT
NEEDLE HYPO 22GX1.5 SAFETY (NEEDLE) ×3 IMPLANT
NEEDLE MAYO .5 CIRCLE (NEEDLE) ×3 IMPLANT
NS IRRIG 1000ML POUR BTL (IV SOLUTION) ×3 IMPLANT
PACK CYSTO (CUSTOM PROCEDURE TRAY) ×3 IMPLANT
PACKING VAGINAL (PACKING) ×3 IMPLANT
PENCIL BUTTON HOLSTER BLD 10FT (ELECTRODE) ×3 IMPLANT
PLUG CATH AND CAP STER (CATHETERS) ×3 IMPLANT
RETRACTOR STAY HOOK 5MM (MISCELLANEOUS) ×3 IMPLANT
SHEET LAVH (DRAPES) ×3 IMPLANT
SUT CAPIO ETHIBPND (SUTURE) ×9 IMPLANT
SUT MNCRL AB 4-0 PS2 18 (SUTURE) ×6 IMPLANT
SUT SILK 2 0 30  PSL (SUTURE)
SUT SILK 2 0 30 PSL (SUTURE) IMPLANT
SUT VIC AB 0 CT1 27 (SUTURE) ×4
SUT VIC AB 0 CT1 27XBRD ANTBC (SUTURE) ×2 IMPLANT
SUT VIC AB 2-0 CT1 27 (SUTURE) ×4
SUT VIC AB 2-0 CT1 27XBRD (SUTURE) ×2 IMPLANT
SUT VIC AB 2-0 SH 27 (SUTURE) ×8
SUT VIC AB 2-0 SH 27X BRD (SUTURE) ×4 IMPLANT
SUT VIC AB 3-0 PS2 18 (SUTURE) ×2
SUT VIC AB 3-0 PS2 18XBRD (SUTURE) ×1 IMPLANT
SUT VIC AB 3-0 SH 27 (SUTURE) ×4
SUT VIC AB 3-0 SH 27XBRD (SUTURE) ×2 IMPLANT
SUT VICRYL 0 UR6 27IN ABS (SUTURE) ×6 IMPLANT
SYRINGE 10CC LL (SYRINGE) ×3 IMPLANT
TISSUE REPAIR XENFORM 6X10CM (Tissue) ×3 IMPLANT
TUBING CONNECTING 10 (TUBING) ×2 IMPLANT
TUBING CONNECTING 10' (TUBING) ×1
YANKAUER SUCT BULB TIP 10FT TU (MISCELLANEOUS) ×3 IMPLANT

## 2014-01-13 NOTE — Interval H&P Note (Signed)
History and Physical Interval Note:  01/13/2014 7:19 AM  Maureen Elliott  has presented today for surgery, with the diagnosis of CYSTOCELE VAULT PROLAPSE/STRESS INCONTINENCE  The various methods of treatment have been discussed with the patient and family. After consideration of risks, benefits and other options for treatment, the patient has consented to  Procedure(s): CYSTOSCOPY, REPAIR CYSTOCELE VAULT PROLAPSE/GRAFT (N/A) as a surgical intervention .  The patient's history has been reviewed, patient examined, no change in status, stable for surgery.  I have reviewed the patient's chart and labs.  Questions were answered to the patient's satisfaction.     [name] A Kaymen Adrian

## 2014-01-13 NOTE — Transfer of Care (Signed)
Immediate Anesthesia Transfer of Care Note  Patient: Marliss CootsLinda L Gullo  Procedure(s) Performed: Procedure(s): CYSTOSCOPY, REPAIR CYSTOCELE VAULT PROLAPSE/GRAFT (N/A)  Patient Location: PACU  Anesthesia Type:General  Level of Consciousness: awake, alert , oriented and patient cooperative  Airway & Oxygen Therapy: Patient Spontanous Breathing and Patient connected to face mask oxygen  Post-op Assessment: Report given to PACU RN, Post -op Vital signs reviewed and stable and Patient moving all extremities X 4  Post vital signs: stable  Complications: No apparent anesthesia complications

## 2014-01-13 NOTE — Anesthesia Postprocedure Evaluation (Signed)
  Anesthesia Post-op Note  Patient: Maureen CootsLinda L Elliott  Procedure(s) Performed: Procedure(s) (LRB): CYSTOSCOPY, REPAIR CYSTOCELE VAULT PROLAPSE/GRAFT (N/A)  Patient Location: PACU  Anesthesia Type: General  Level of Consciousness: awake and alert   Airway and Oxygen Therapy: Patient Spontanous Breathing  Post-op Pain: mild  Post-op Assessment: Post-op Vital signs reviewed, Patient's Cardiovascular Status Stable, Respiratory Function Stable, Patent Airway and No signs of Nausea or vomiting  Last Vitals:  Filed Vitals:   01/13/14 1155  BP: 116/60  Pulse: 61  Temp: 36.3 C  Resp: 10    Post-op Vital Signs: stable   Complications: No apparent anesthesia complications

## 2014-01-13 NOTE — Anesthesia Preprocedure Evaluation (Addendum)
Anesthesia Evaluation  Patient identified by MRN, date of birth, ID band Patient awake    Reviewed: Allergy & Precautions, H&P , NPO status , Patient's Chart, lab work & pertinent test results  History of Anesthesia Complications (+) PONV  Airway Mallampati: II TM Distance: >3 FB Neck ROM: Full    Dental no notable dental hx.    Pulmonary neg pulmonary ROS,  breath sounds clear to auscultation  Pulmonary exam normal       Cardiovascular negative cardio ROS  Rhythm:Regular Rate:Normal     Neuro/Psych negative neurological ROS  negative psych ROS   GI/Hepatic negative GI ROS, Neg liver ROS,   Endo/Other  negative endocrine ROS  Renal/GU negative Renal ROS  negative genitourinary   Musculoskeletal negative musculoskeletal ROS (+)   Abdominal   Peds negative pediatric ROS (+)  Hematology negative hematology ROS (+)   Anesthesia Other Findings H/o cervical surgery  Reproductive/Obstetrics negative OB ROS                          Anesthesia Physical Anesthesia Plan  ASA: II  Anesthesia Plan: General   Post-op Pain Management:    Induction: Intravenous  Airway Management Planned: Oral ETT and Video Laryngoscope Planned  Additional Equipment:   Intra-op Plan:   Post-operative Plan: Extubation in OR  Informed Consent: I have reviewed the patients History and Physical, chart, labs and discussed the procedure including the risks, benefits and alternatives for the proposed anesthesia with the patient or authorized representative who has indicated his/her understanding and acceptance.   Dental advisory given  Plan Discussed with: CRNA  Anesthesia Plan Comments:         Anesthesia Quick Evaluation

## 2014-01-13 NOTE — Op Note (Signed)
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 Lorin PicketScott Mykia Holton Assistant: Pecola LeisureAmanda Yarbrough  The patient has the above diagnoses and consented to the above procedure. Extra care was taken with leg positioning to minimize the risk of compartment syndrome and neuropathy and deep vein thrombosis. Preoperative laboratory tests were normal. Preoperative antibiotics were given.  She had a moderately long anterior vaginal wall. I placed a 3-0 Vicryl at the vaginal cuff utilizing her dimples. I could feel a ridge in the upper third of the vagina posteriorly from her previous posterior repair with mesh.  I instilled 20 cc of a lidocaine epinephrine mixture. Between 3 Allis clamps I made a T-shaped anterior vaginal wall incision and mobilized the underlying pubocervical fascia from the vaginal wall mucosa to the white line bilaterally. Appropriate mobilization was performed at the apex. She had apical weakness almost like she had an enterocele but she did not have an enterocele. She had thinning of the rectovaginal mucosa in that area. The thinning was not from the mucosal dissection  Without distorting the anatomy I performed a 2 layer anterior repair with a 2-0 Vicryl on an SH needle not imbricating the bladder neck and not distorting the anatomy. I did not shorten the bladder wall.  I cystoscoped the patient. The patient did not receive peridium preoperatively. A full amp of indigocarmine was given at the beginning of the case. I used a 30 and the 70 lens and I was confident there was eflux of light blue urine from both ureteral orifices. I viewed the ureters with and without flow. The ureteral orifices would open up nicely with peristalsis and again I was confident I saw urine. There was no distortion of the ureters and cystoscopically she a good anterior  repair. There was no injury to the bladder  I finger dissected to the ischiial spine bilaterally. The spine was easy to feel but she really had no significant soft tissues or ligament medially. I attempted to place a 0 Ethibond 1 full finger breath medial to the ischial spine bilaterally in a straight line between the spines but after multiple tries there was really no tissue to sit the suture. The Capio device was used. The initial mobilization and sweeping of the soft tissue medially went very well. I wondered if the sacrospinous ligament and soft tissue had been distorted or pulled caudally because of the previous posterior mesh procedure.  At this stage I decided to place a 0 Ethibond more caudally but still medial to the spine as above. I seated the suture bilaterally a little bit more cephalid on the left than the right. I did not think I could improve upon the position on the right. Functionally it was more of an iliococcygeus repair and a sacrospinous repair. I did a digital rectal examination and she had no injury to the rectum or suture in the rectum.  Appropriate blunt and sharp dissection expose the pelvic sidewall at the level of the urethrovesical angle. I placed a 0 Vicryl in the pelvic sidewall bilaterally. I cut a 10 x 6 dermal graft in the shape of a trapezoid that was prepared appropriately. The graft was sewn in place between the 4 sutures tension-free. It laid in very nicely  I trimmed an appropriate amount of redundant anterior vaginal wall mucosa. I irrigated appropriately. I closed the anterior vaginal wall with running 2-0 Vicryl on a CT1 needle. I  sewed the middle of the graft cephalad to the vaginal wall apex soft tissue.  At the end of the case the patient had excellent vaginal length and very good support anteriorly. I was very pleased with the repair. Blood loss was approximately 500 mL was general oozing. Urine output again throughout the case before and after dissection was  excellent. One and one half vaginal packs were used with Estrace cream. Patient taken to recovery room and hopefully the procedure will reach the patient's treatment goal  I believe is Maureen Elliott had a recurrence apically a sacrospinous fixation may be appropriate based upon the pelvic findings today.

## 2014-01-13 NOTE — Progress Notes (Signed)
Looks good Mobile No pain or nausea Labs pending Urine clear and good Intra op findings at apex discussed  See in am

## 2014-01-14 ENCOUNTER — Other Ambulatory Visit: Payer: Self-pay | Admitting: Urology

## 2014-01-14 ENCOUNTER — Encounter (HOSPITAL_COMMUNITY): Payer: Self-pay | Admitting: Urology

## 2014-01-14 LAB — BASIC METABOLIC PANEL
BUN: 11 mg/dL (ref 6–23)
CHLORIDE: 105 meq/L (ref 96–112)
CO2: 28 meq/L (ref 19–32)
Calcium: 8.7 mg/dL (ref 8.4–10.5)
Creatinine, Ser: 0.72 mg/dL (ref 0.50–1.10)
GFR calc Af Amer: >90 mL/min (ref >90)
GFR calc non Af Amer: 87 mL/min — ABNORMAL LOW (ref >90)
Glucose, Bld: 148 mg/dL — ABNORMAL HIGH (ref 70–99)
Potassium: 4.1 mEq/L (ref 3.7–5.3)
Sodium: 140 mEq/L (ref 137–147)

## 2014-01-14 LAB — HEMOGLOBIN AND HEMATOCRIT, BLOOD
HCT: 33.8 % — ABNORMAL LOW (ref 36.0–46.0)
Hemoglobin: 11 g/dL — ABNORMAL LOW (ref 12.0–15.0)

## 2014-01-14 NOTE — Progress Notes (Signed)
Foley cath and vaginal packing d/c per MD orders. Pt tol both well. Vag packing w/ minimal old bloody drainage noted upon removal. Pt up ambulating in halls immediately after removal.

## 2014-01-14 NOTE — Progress Notes (Signed)
Pt PVR checked by bladder scan x2 post foley removal.  First void 125ml w/ 160ml PVR.  Second void 250ml w/ 153ml PVR.  Pt comfortable, denies feeling any retention or urgency.  Pt ambulating indep around unit, had BM this AM, tolerating diet.  Dr Sherron MondayMacdiarmid aware of all of above, states pt okay for d/c home.

## 2014-01-14 NOTE — Progress Notes (Signed)
Looks great No pain Mobile Vitals and labs normal Urine clear Post op care discussed

## 2014-01-14 NOTE — Discharge Instructions (Signed)
As discussed with Dr. Sherron MondayMacdiarmid.  Obtain colace and miralax to use as described by Dr. Sherron MondayMacdiarmid.  I have reviewed discharge instructions in detail with the patient. They will follow-up with me or their physician as scheduled. My nurse will also be calling the patients as per protocol.

## 2014-01-14 NOTE — Discharge Summary (Signed)
Date of admission: 01/13/2014  Date of discharge: 01/14/2014  Admission diagnosis:Cystocle  Discharge diagnosis: Cystocele  Secondary diagnoses: Vault prolapse  History and Physical: For full details, please see admission history and physical. Briefly, Maureen Elliott is a 68 y.o. year old patient with the above diagnosis.   Hospital Course: Surgery went very well with excellent post op course.   Laboratory values:  Recent Labs  01/13/14 1602 01/14/14 0350  HGB 11.7* 11.0*  HCT 34.8* 33.8*    Recent Labs  01/14/14 0350  CREATININE 0.72    Disposition: Home  Discharge instruction: The patient was instructed to be ambulatory but told to refrain from heavy lifting, strenuous activity, or driving. All discussed  Discharge medications:    Medication List    STOP taking these medications       calcium-vitamin D 500-200 MG-UNIT per tablet  Commonly known as:  OSCAL WITH D     cholecalciferol 1000 UNITS tablet  Commonly known as:  VITAMIN D     conjugated estrogens vaginal cream  Commonly known as:  PREMARIN     multivitamin with minerals Tabs tablet     OVER THE COUNTER MEDICATION      TAKE these medications       HYDROcodone-acetaminophen 5-325 MG per tablet  Commonly known as:  NORCO  Take 1-2 tablets by mouth every 6 (six) hours as needed.        Followup:      Follow-up Information   Follow up with Maxine Fredman A, MD. (office will call with appt date and time)    Specialty:  Urology   Contact information:   [address] [facility]  [place] [address] [phone]       Follow up with Florence Yeung A, MD. (as scheduled)    Specialty:  Urology   Contact information:   [address] [facility]  [place] [address] [phone]

## 2014-01-14 NOTE — Progress Notes (Signed)
D/C instructions reviewed w/ pt and husband. Both verbalize understanding and all questions answered. Pt d/c in stable condition in w/c by NT to husband's car. Pt in possession of d/c instructions, script, and all personal belongings.

## 2014-09-15 ENCOUNTER — Emergency Department (HOSPITAL_BASED_OUTPATIENT_CLINIC_OR_DEPARTMENT_OTHER): Payer: Medicare Other

## 2014-09-15 ENCOUNTER — Encounter (HOSPITAL_BASED_OUTPATIENT_CLINIC_OR_DEPARTMENT_OTHER): Payer: Self-pay

## 2014-09-15 ENCOUNTER — Emergency Department (HOSPITAL_BASED_OUTPATIENT_CLINIC_OR_DEPARTMENT_OTHER)
Admission: EM | Admit: 2014-09-15 | Discharge: 2014-09-15 | Disposition: A | Discharge status: Home / Self Care | Payer: Medicare Other | Attending: Emergency Medicine | Admitting: Emergency Medicine

## 2014-09-15 DIAGNOSIS — Z8632 Personal history of gestational diabetes: Secondary | ICD-10-CM | POA: Diagnosis not present

## 2014-09-15 DIAGNOSIS — Z79899 Other long term (current) drug therapy: Secondary | ICD-10-CM | POA: Insufficient documentation

## 2014-09-15 DIAGNOSIS — R112 Nausea with vomiting, unspecified: Secondary | ICD-10-CM | POA: Insufficient documentation

## 2014-09-15 DIAGNOSIS — Z880 Allergy status to penicillin: Secondary | ICD-10-CM | POA: Diagnosis not present

## 2014-09-15 DIAGNOSIS — Z8719 Personal history of other diseases of the digestive system: Secondary | ICD-10-CM | POA: Diagnosis not present

## 2014-09-15 DIAGNOSIS — R1110 Vomiting, unspecified: Secondary | ICD-10-CM

## 2014-09-15 LAB — CBC WITH DIFFERENTIAL/PLATELET
Basophils Absolute: 0 10*3/uL (ref 0.0–0.1)
Basophils Relative: 0 % (ref 0–1)
Eosinophils Absolute: 0 10*3/uL (ref 0.0–0.7)
Eosinophils Relative: 0 % (ref 0–5)
HCT: 37.8 % (ref 36.0–46.0)
Hemoglobin: 12.7 g/dL (ref 12.0–15.0)
LYMPHS ABS: 0.8 10*3/uL (ref 0.7–4.0)
Lymphocytes Relative: 11 % — ABNORMAL LOW (ref 12–46)
MCH: 30 pg (ref 26.0–34.0)
MCHC: 33.6 g/dL (ref 30.0–36.0)
MCV: 89.4 fL (ref 78.0–100.0)
Monocytes Absolute: 0.4 10*3/uL (ref 0.1–1.0)
Monocytes Relative: 6 % (ref 3–12)
NEUTROS PCT: 83 % — AB (ref 43–77)
Neutro Abs: 5.9 10*3/uL (ref 1.7–7.7)
PLATELETS: 239 10*3/uL (ref 150–400)
RBC: 4.23 MIL/uL (ref 3.87–5.11)
RDW: 13.6 % (ref 11.5–15.5)
WBC: 7.1 10*3/uL (ref 4.0–10.5)

## 2014-09-15 LAB — COMPREHENSIVE METABOLIC PANEL
ALK PHOS: 52 U/L (ref 39–117)
ALT: 16 U/L (ref 0–35)
AST: 25 U/L (ref 0–37)
Albumin: 4.2 g/dL (ref 3.5–5.2)
Anion gap: 10 (ref 5–15)
BUN: 14 mg/dL (ref 6–23)
CO2: 24 mmol/L (ref 19–32)
Calcium: 9.5 mg/dL (ref 8.4–10.5)
Chloride: 105 mEq/L (ref 96–112)
Creatinine, Ser: 0.77 mg/dL (ref 0.50–1.10)
GFR calc non Af Amer: 85 mL/min — ABNORMAL LOW (ref >90)
GLUCOSE: 159 mg/dL — AB (ref 70–99)
POTASSIUM: 3.3 mmol/L — AB (ref 3.5–5.1)
SODIUM: 139 mmol/L (ref 135–145)
TOTAL PROTEIN: 6.9 g/dL (ref 6.0–8.3)
Total Bilirubin: 0.5 mg/dL (ref 0.3–1.2)

## 2014-09-15 LAB — URINALYSIS, ROUTINE W REFLEX MICROSCOPIC
Bilirubin Urine: NEGATIVE
Glucose, UA: NEGATIVE mg/dL
Hgb urine dipstick: NEGATIVE
Ketones, ur: NEGATIVE mg/dL
LEUKOCYTES UA: NEGATIVE
NITRITE: NEGATIVE
PH: 8.5 — AB (ref 5.0–8.0)
Protein, ur: NEGATIVE mg/dL
Specific Gravity, Urine: 1.015 (ref 1.005–1.030)
Urobilinogen, UA: 0.2 mg/dL (ref 0.0–1.0)

## 2014-09-15 LAB — LIPASE, BLOOD: Lipase: 23 U/L (ref 11–59)

## 2014-09-15 LAB — TROPONIN I: Troponin I: <0.03 ng/mL (ref <0.031)

## 2014-09-15 MED ORDER — SODIUM CHLORIDE 0.9 % IV BOLUS (SEPSIS)
1000.0000 mL | Freq: Once | INTRAVENOUS | Status: AC
  Administered 2014-09-15: 1000 mL via INTRAVENOUS

## 2014-09-15 MED ORDER — ONDANSETRON HCL 4 MG/2ML IJ SOLN
4.0000 mg | Freq: Once | INTRAMUSCULAR | Status: AC
  Administered 2014-09-15: 4 mg via INTRAVENOUS
  Filled 2014-09-15: qty 2

## 2014-09-15 MED ORDER — ONDANSETRON HCL 4 MG PO TABS: 4.0000 mg | ORAL_TABLET | Freq: Four times a day (QID) | ORAL | Status: DC

## 2014-09-15 NOTE — ED Notes (Signed)
Vomiting that started after eating eggs this am.

## 2014-09-15 NOTE — ED Notes (Signed)
Pt tolerating PO fluids well at this time 

## 2014-09-15 NOTE — ED Provider Notes (Signed)
CSN: [id number]     Arrival date & time 09/15/14  [id number] History  This chart was scribed for Glynn OctaveStephen Darsh Vandevoort, MD by Leone PayorSonum Patel, ED Scribe. This patient was seen in room MH08/MH08 and the patient's care was started 9:54 AM.     Chief Complaint  Patient presents with  . Emesis   The history is provided by the patient and the spouse. No language interpreter was used.     HPI Comments: Maureen Elliott is a 68 y.o. female who presents to the Emergency Department complaining of several episodes of vomiting that began after eating eggs for breakfast this morning. She reports having 8 episodes in the past 2 hours which began 20 min after eating. She states the eggs were not expired but is concerned this may be food related. She has associated dizziness currently. She denies diarrhea, fever, chest pain, SOB, abdominal pain, hematemesis . She has a history of cholecystectomy, abdominal hysterectomy.   Past Medical History  Diagnosis Date  . Nocturia   . Cystocele   . Hemorrhoids internal and external  . Diverticulosis   . Complication of anesthesia     be careful with intubation due to previous cervical neck surgery  . PONV (postoperative nausea and vomiting) 2013    n/v after phenergan, injectable phenergan helps with nausea  . History of gestational diabetes yrs ago   Past Surgical History  Procedure Laterality Date  . Colonscopy  feb 2015    blood in stool, just was hemorrhoids  . Eye surgery Bilateral 8 yrs ago    lens replacement for cataract  . Abdominal hysterectomy  2013    ovaries removed   . Cervical fusion c 5, c 6 and c 7  2004  . Tummy tuck with mesh  6 yrs ago    large abdominal is present  . Cystocele repair N/A 01/13/2014    Procedure: CYSTOSCOPY, REPAIR CYSTOCELE VAULT PROLAPSE/GRAFT;  Surgeon: Martina SinnerScott A MacDiarmid, MD;  Location: [facility];  Service: Urology;  Laterality: N/A;   No family history on file. History  Substance Use Topics  . Smoking status: Never Smoker   .  Smokeless tobacco: Never Used  . Alcohol Use: Yes     Comment: occasional   OB History    No data available     Review of Systems  A complete 10 system review of systems was obtained and all systems are negative except as noted in the HPI and PMH.    Allergies  Amoxicillin; Keflex; and Levaquin  Home Medications   Prior to Admission medications   Medication Sig Start Date End Date Taking? Authorizing Provider  Calcium Carbonate-Vitamin D (CALCIUM-D PO) Take by mouth.   Yes Historical Provider, MD  Fish Oil OIL by Does not apply route.   Yes Historical Provider, MD  Multiple Vitamin (MULTIVITAMIN) tablet Take 1 tablet by mouth daily.   Yes Historical Provider, MD  Plant Sterols and Stanols (CHOLEST OFF PO) Take by mouth.   Yes Historical Provider, MD  VITAMIN D, CHOLECALCIFEROL, PO Take by mouth.   Yes Historical Provider, MD  HYDROcodone-acetaminophen (NORCO) 5-325 MG per tablet Take 1-2 tablets by mouth every 6 (six) hours as needed. 01/13/14   Harrie ForemanAmanda Dancy, PA-C  ondansetron (ZOFRAN) 4 MG tablet Take 1 tablet (4 mg total) by mouth every 6 (six) hours. 09/15/14   Glynn OctaveStephen Siyah Mault, MD   BP 121/54 mmHg  Pulse 65  Temp(Src) 98.7 F (37.1 C) (Oral)  Resp 16  Ht 5'  3" (1.6 m)  Wt 153 lb (69.4 kg)  BMI 27.11 kg/m2  SpO2 98% Physical Exam  Constitutional: She is oriented to person, place, and time. She appears well-developed and well-nourished. No distress.  HENT:  Head: Normocephalic and atraumatic.  Mouth/Throat: Oropharynx is clear and moist. Mucous membranes are dry. No oropharyngeal exudate.  Eyes: Conjunctivae and EOM are normal. Pupils are equal, round, and reactive to light.  Neck: Normal range of motion. Neck supple.  No meningismus.  Cardiovascular: Normal rate, regular rhythm, normal heart sounds and intact distal pulses.   No murmur heard. Pulmonary/Chest: Effort normal and breath sounds normal. No respiratory distress.  Abdominal: Soft. There is no tenderness.  There is no rebound and no guarding.  Multiple previous surgery scars.   Musculoskeletal: Normal range of motion. She exhibits no edema or tenderness.  Neurological: She is alert and oriented to person, place, and time. No cranial nerve deficit. She exhibits normal muscle tone. Coordination normal.  No ataxia on finger to nose bilaterally. No pronator drift. 5/5 strength throughout. CN 2-12 intact. Negative Romberg. Equal grip strength. Sensation intact. Gait is normal.   Skin: Skin is warm.  Psychiatric: She has a normal mood and affect. Her behavior is normal.  Nursing note and vitals reviewed.   ED Course  Procedures (including critical care time)  DIAGNOSTIC STUDIES: Oxygen Saturation is 100% on RA, normal by my interpretation.    COORDINATION OF CARE: 10:01 AM Discussed treatment plan with pt at bedside and pt agreed to plan.   Labs Review Labs Reviewed  CBC WITH DIFFERENTIAL - Abnormal; Notable for the following:    Neutrophils Relative % 83 (*)    Lymphocytes Relative 11 (*)    All other components within normal limits  COMPREHENSIVE METABOLIC PANEL - Abnormal; Notable for the following:    Potassium 3.3 (*)    Glucose, Bld 159 (*)    GFR calc non Af Amer 85 (*)    All other components within normal limits  URINALYSIS, ROUTINE W REFLEX MICROSCOPIC - Abnormal; Notable for the following:    pH 8.5 (*)    All other components within normal limits  LIPASE, BLOOD  TROPONIN I    Imaging Review Dg Abd Acute W/chest  09/15/2014   CLINICAL DATA:  Onset of vomiting this morning following ingestion of thanks  EXAM: ACUTE ABDOMEN SERIES (ABDOMEN 2 VIEW & CHEST 1 VIEW)  COMPARISON:  None.  FINDINGS: The lungs are adequately inflated and clear. The heart and mediastinal structures are normal. There is no pleural effusion. The patient has undergone previous lower anterior cervical fusion.  Within the abdomen the bowel gas pattern is nonspecific. There is no evidence of ileus,  obstruction, or gastroenteritis. There numerous vascular clips in the right and left mid abdomen, gallbladder fossa, and bony pelvis. There are degenerative changes of the lower lumbar spine and of the hips.  IMPRESSION: 1. There is no acute intra-abdominal abnormality demonstrated. There is no acute cardiopulmonary abnormality. 2. 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   Electronically Signed   By: David  [place]azil[place]   On: 09/15/2014 11:10     EKG Interpretation   Date/Time:  Tuesday September 15 2014 10:20:44 EST Ventricular Rate:  64 PR Interval:  178 QRS Duration: 100 QT Interval:  442 QTC Calculation: 455 R Axis:   47 Text Interpretation:  Normal sinus rhythm Normal ECG No significant change  was found Confirmed by Manus GunningANCOUR  MD, Anuhea Gassner [phone]([id number]) on  09/15/2014 10:30:19  AM      MDM   Final diagnoses:  Vomiting   Nausea, vomiting after eating breakfast this morning. She thinks she had a bad egg.  Her husband is feeling somewhat ill as well. Denies chest pain or abdominal pain.  EKG normal sinus rhythm. Labs unremarkable. AAS negative. Abdomen soft and nontender Patient tolerating by mouth. No vomiting in the ED.  Tolerating fluids well in the ED. Patient feels better. Will be given antiemetics for home use. Denies chest pain, abdominal pain or any other concerns. Suspect food related vomiting. Return precautions discussed.   I personally performed the services described in this documentation, which was scribed in my presence. The recorded information has been reviewed and is accurate.   Glynn Octave, MD 09/15/14 [phone]

## 2014-09-15 NOTE — Discharge Instructions (Signed)

## 2016-11-30 ENCOUNTER — Other Ambulatory Visit: Payer: Self-pay | Admitting: Family Medicine

## 2016-11-30 DIAGNOSIS — Z1231 Encounter for screening mammogram for malignant neoplasm of breast: Secondary | ICD-10-CM

## 2016-12-09 ENCOUNTER — Emergency Department (HOSPITAL_BASED_OUTPATIENT_CLINIC_OR_DEPARTMENT_OTHER)
Admission: EM | Admit: 2016-12-09 | Discharge: 2016-12-09 | Disposition: A | Discharge status: Home / Self Care | Payer: Medicare Other | Attending: Emergency Medicine | Admitting: Emergency Medicine

## 2016-12-09 ENCOUNTER — Emergency Department (HOSPITAL_BASED_OUTPATIENT_CLINIC_OR_DEPARTMENT_OTHER): Payer: Medicare Other

## 2016-12-09 ENCOUNTER — Encounter (HOSPITAL_BASED_OUTPATIENT_CLINIC_OR_DEPARTMENT_OTHER): Payer: Self-pay | Admitting: Emergency Medicine

## 2016-12-09 DIAGNOSIS — Z79899 Other long term (current) drug therapy: Secondary | ICD-10-CM | POA: Insufficient documentation

## 2016-12-09 DIAGNOSIS — R42 Dizziness and giddiness: Secondary | ICD-10-CM | POA: Diagnosis present

## 2016-12-09 DIAGNOSIS — G4489 Other headache syndrome: Secondary | ICD-10-CM | POA: Diagnosis not present

## 2016-12-09 HISTORY — DX: Unspecified glaucoma: H40.9

## 2016-12-09 LAB — COMPREHENSIVE METABOLIC PANEL
ALBUMIN: 3.7 g/dL (ref 3.5–5.0)
ALK PHOS: 63 U/L (ref 38–126)
ALT: 19 U/L (ref 14–54)
ANION GAP: 7 (ref 5–15)
AST: 23 U/L (ref 15–41)
BILIRUBIN TOTAL: 0.6 mg/dL (ref 0.3–1.2)
BUN: 18 mg/dL (ref 6–20)
CO2: 27 mmol/L (ref 22–32)
Calcium: 9.1 mg/dL (ref 8.9–10.3)
Chloride: 104 mmol/L (ref 101–111)
Creatinine, Ser: 0.9 mg/dL (ref 0.44–1.00)
GFR calc Af Amer: >60 mL/min (ref >60)
GFR calc non Af Amer: >60 mL/min (ref >60)
GLUCOSE: 111 mg/dL — AB (ref 65–99)
Potassium: 4 mmol/L (ref 3.5–5.1)
SODIUM: 138 mmol/L (ref 135–145)
Total Protein: 7.1 g/dL (ref 6.5–8.1)

## 2016-12-09 LAB — CBC WITH DIFFERENTIAL/PLATELET
Basophils Absolute: 0 10*3/uL (ref 0.0–0.1)
Basophils Relative: 0 %
EOS PCT: 3 %
Eosinophils Absolute: 0.2 10*3/uL (ref 0.0–0.7)
HCT: 39.6 % (ref 36.0–46.0)
HEMOGLOBIN: 13.2 g/dL (ref 12.0–15.0)
LYMPHS ABS: 1 10*3/uL (ref 0.7–4.0)
Lymphocytes Relative: 15 %
MCH: 30.1 pg (ref 26.0–34.0)
MCHC: 33.3 g/dL (ref 30.0–36.0)
MCV: 90.4 fL (ref 78.0–100.0)
MONOS PCT: 11 %
Monocytes Absolute: 0.7 10*3/uL (ref 0.1–1.0)
NEUTROS PCT: 71 %
Neutro Abs: 4.7 10*3/uL (ref 1.7–7.7)
Platelets: 260 10*3/uL (ref 150–400)
RBC: 4.38 MIL/uL (ref 3.87–5.11)
RDW: 15 % (ref 11.5–15.5)
WBC: 6.6 10*3/uL (ref 4.0–10.5)

## 2016-12-09 MED ORDER — SODIUM CHLORIDE 0.9 % IV BOLUS (SEPSIS)
500.0000 mL | Freq: Once | INTRAVENOUS | Status: AC
  Administered 2016-12-09: 500 mL via INTRAVENOUS

## 2016-12-09 MED ORDER — SODIUM CHLORIDE 0.9 % IV SOLN
INTRAVENOUS | Status: DC
  Administered 2016-12-09: 10:00:00 via INTRAVENOUS

## 2016-12-09 MED ORDER — ONDANSETRON HCL 4 MG/2ML IJ SOLN
4.0000 mg | Freq: Once | INTRAMUSCULAR | Status: AC
  Administered 2016-12-09: 4 mg via INTRAVENOUS
  Filled 2016-12-09: qty 2

## 2016-12-09 MED ORDER — MECLIZINE HCL 25 MG PO TABS: 25.0000 mg | ORAL_TABLET | Freq: Three times a day (TID) | ORAL | 0 refills | Status: DC | PRN

## 2016-12-09 NOTE — ED Notes (Signed)
Pt transported to MRI 

## 2016-12-09 NOTE — ED Notes (Signed)
Pt on cardiac monitor and auto VS 

## 2016-12-09 NOTE — Discharge Instructions (Signed)
Today's workup without any acute findings. MRI and head CT negative. Labs without significant abnormalities. If you're having just nausea with take the Zofran you have at home. If you're having room spinning with take the meclizine as needed. If symptoms persist follow-up with your regular doctor. Return for new or worse symptoms.

## 2016-12-09 NOTE — ED Provider Notes (Signed)
[facility] Provider Note   CSN: [id number] Arrival date & time: 12/09/16  0845     History   Chief Complaint Chief Complaint  Patient presents with  . Weakness    HPI Maureen Elliott is a 71 y.o. female.  Patient with onset of some room spinning last evening much worse this morning upon awakening. Associated with nausea. Also with some headache upper [place] left-sided temporal area. No fall or injuries. Patient without symptoms like this in the past. No motor weakness. No speech difficulties no visual changes. Patient recently out of the country 3 weeks ago to [place]. Patient also a concern for diabetes may be being worse she's been told that she was borderline. Patient denies any chest pain or shortness of breath.      Past Medical History:  Diagnosis Date  . Complication of anesthesia    be careful with intubation due to previous cervical neck surgery  . Cystocele   . Diverticulosis   . Glaucoma   . Hemorrhoids internal and external  . History of gestational diabetes yrs ago  . Nocturia   . PONV (postoperative nausea and vomiting) 2013   n/v after phenergan, injectable phenergan helps with nausea    Patient Active Problem List   Diagnosis Date Noted  . Cystocele 01/13/2014    Past Surgical History:  Procedure Laterality Date  . ABDOMINAL HYSTERECTOMY  2013   ovaries removed   . BLADDER SURGERY    . cervical fusion C 5, C 6 and C 7  2004  . colonscopy  feb 2015   blood in stool, just was hemorrhoids  . CYSTOCELE REPAIR N/A 01/13/2014   Procedure: CYSTOSCOPY, REPAIR CYSTOCELE VAULT PROLAPSE/GRAFT;  Surgeon: Martina Sinner, MD;  Location: [facility];  Service: Urology;  Laterality: N/A;  . EYE SURGERY Bilateral 8 yrs ago   lens replacement for cataract  . tummy tuck with mesh  6 yrs ago   large abdominal is present    OB History    No data available       Home Medications    Prior to Admission medications   Medication Sig  Start Date End Date Taking? Authorizing Provider  bimatoprost (LUMIGAN) 0.01 % SOLN 1 drop at bedtime.   Yes Historical Provider, MD  brimonidine (ALPHAGAN) 0.2 % ophthalmic solution 1 drop 3 (three) times daily.   Yes Historical Provider, MD  glucosamine-chondroitin 500-400 MG tablet Take 1 tablet by mouth 3 (three) times daily.   Yes Historical Provider, MD  Calcium Carbonate-Vitamin D (CALCIUM-D PO) Take by mouth.    Historical Provider, MD  Fish Oil OIL by Does not apply route.    Historical Provider, MD  HYDROcodone-acetaminophen (NORCO) 5-325 MG per tablet Take 1-2 tablets by mouth every 6 (six) hours as needed. 01/13/14   Harrie Foreman, PA-C  meclizine (ANTIVERT) 25 MG tablet Take 1 tablet (25 mg total) by mouth 3 (three) times daily as needed for dizziness. 12/09/16   Vanetta Mulders, MD  Multiple Vitamin (MULTIVITAMIN) tablet Take 1 tablet by mouth daily.    Historical Provider, MD  ondansetron (ZOFRAN) 4 MG tablet Take 1 tablet (4 mg total) by mouth every 6 (six) hours. 09/15/14   Glynn Octave, MD  Plant Sterols and Stanols (CHOLEST OFF PO) Take by mouth.    Historical Provider, MD  VITAMIN D, CHOLECALCIFEROL, PO Take by mouth.    Historical Provider, MD    Family History No family history on file.  Social History  Social History  Substance Use Topics  . Smoking status: Never Smoker  . Smokeless tobacco: Never Used  . Alcohol use Yes     Comment: occasional     Allergies   Amoxicillin; Keflex [cephalexin]; and Levaquin [levofloxacin]   Review of Systems Review of Systems  Constitutional: Positive for fatigue. Negative for fever.  HENT: Negative for congestion and hearing loss.   Eyes: Negative for visual disturbance.  Respiratory: Negative for shortness of breath.   Cardiovascular: Negative for chest pain.  Gastrointestinal: Positive for nausea. Negative for abdominal pain and vomiting.  Genitourinary: Negative for dysuria.  Musculoskeletal: Negative for back pain.    Skin: Negative for rash.  Neurological: Positive for dizziness and headaches. Negative for syncope, speech difficulty and weakness.  Hematological: Does not bruise/bleed easily.  Psychiatric/Behavioral: Negative for confusion.     Physical Exam Updated Vital Signs BP 117/86 (BP Location: Left Arm)   Pulse 66   Temp 97.7 F (36.5 C) (Oral)   Resp 16   Ht 5\' 3"  (1.6 m)   Wt 78 kg   SpO2 100%   BMI 30.47 kg/m   Physical Exam  Constitutional: She is oriented to person, place, and time. She appears well-developed and well-nourished. No distress.  HENT:  Head: Normocephalic and atraumatic.  Mouth/Throat: Oropharynx is clear and moist.  Eyes: Conjunctivae and EOM are normal. Pupils are equal, round, and reactive to light.  Neck: Normal range of motion. Neck supple.  Cardiovascular: Normal rate and regular rhythm.   Pulmonary/Chest: Effort normal and breath sounds normal.  Abdominal: Soft. Bowel sounds are normal. There is no tenderness.  Musculoskeletal: Normal range of motion. She exhibits no edema.  Neurological: She is alert and oriented to person, place, and time. No cranial nerve deficit or sensory deficit. She exhibits normal muscle tone. Coordination normal.  Skin: Skin is warm.  Nursing note and vitals reviewed.    ED Treatments / Results  Labs (all labs ordered are listed, but only abnormal results are displayed) Labs Reviewed  COMPREHENSIVE METABOLIC PANEL - Abnormal; Notable for the following:       Result Value   Glucose, Bld 111 (*)    All other components within normal limits  CBC WITH DIFFERENTIAL/PLATELET    EKG  EKG Interpretation  Date/Time:  Saturday December 09 2016 09:00:04 EDT Ventricular Rate:  61 PR Interval:    QRS Duration: 96 QT Interval:  417 QTC Calculation: 420 R Axis:   4 Text Interpretation:  Sinus rhythm Abnormal R-wave progression, early transition Baseline wander in lead(s) V2 No significant change since last tracing Confirmed by  Evette Diclemente  MD, Keng Jewel ([id number]) on 12/09/2016 9:23:03 AM       Radiology Ct Head Wo Contrast  Result Date: 12/09/2016 CLINICAL DATA:  Generalized weakness, dizziness, and nausea since this morning. EXAM: CT HEAD WITHOUT CONTRAST TECHNIQUE: Contiguous axial images were obtained from the base of the skull through the vertex without intravenous contrast. COMPARISON:  None. FINDINGS: Brain: No subdural, epidural, or subarachnoid hemorrhage. Ventricles and sulci are normal for age. Cerebellum, brainstem, and basal cisterns are within normal limits. Scattered mild white matter changes. No acute cortical ischemia or infarct. Vascular: No hyperdense vessel or unexpected calcification. Skull: There is a lytic mildly expansile lesion in the left-sided calvarium on series 3, image 11 with thinning of the overlying cortex. The lesion measures 1.6 x 1.6 x 0.8 cm. No other abnormalities seen in the calvarium. Sinuses/Orbits: The paranasal sinuses, middle ears, and left mastoid air  cells are normal. Opacification of posterior right mastoid air cells without bony erosion is unlikely to be an acute process. No overlying soft tissue swelling identified. Other: None. IMPRESSION: 1. No acute intracranial process. 2. Lytic mildly expansile lesion in the left calvarium as above favored to be chronic and benign but is nonspecific on this single study. Recommend comparison to previous outside studies if available to assess stability. Electronically Signed   By: Gerome Sam III M.D   On: 12/09/2016 10:36   Mr Brain Wo Contrast (neuro Protocol)  Result Date: 12/09/2016 CLINICAL DATA:  Vertigo over the last 4 hours. EXAM: MRI HEAD WITHOUT CONTRAST TECHNIQUE: Multiplanar, multiecho pulse sequences of the brain and surrounding structures were obtained without intravenous contrast. COMPARISON:  Head CT same day. FINDINGS: Brain: Diffusion imaging is negative for acute or subacute infarction. The brainstem and cerebellum are normal.  Cerebral hemispheres are normal except for a few scattered punctate foci of T2 and FLAIR signal within the deep and subcortical white matter, frontal predominant. These are commonly seen at this age in healthy individuals in a usually not clinically significant. No evidence of mass lesion, hemorrhage, hydrocephalus or extra-axial collection. Vascular: Major vessels at the base of the brain show flow. Skull and upper cervical spine: No significant finding. Benign cyst or epidermoid in the left frontal calvarium of no importance. Sinuses/Orbits: Sinuses are clear. Orbits appear normal. Small mastoid effusion on the right, probably not significant. Other: None significant IMPRESSION: No acute or significant finding. Scattered punctate foci of T2 and FLAIR signal in the white matter, frontal predominant. These are often seen in healthy individuals of this age and are unlikely to relate to the clinical presentation. No specific cause of vertigo identified. Left frontal calvarial all cyst or epidermoid, not significant Electronically Signed   By: Paulina Fusi M.D.   On: 12/09/2016 12:34    Procedures Procedures (including critical care time)  Medications Ordered in ED Medications  0.9 %  sodium chloride infusion ( Intravenous New Bag/Given 12/09/16 1016)  sodium chloride 0.9 % bolus 500 mL (0 mLs Intravenous Stopped 12/09/16 1050)  ondansetron (ZOFRAN) injection 4 mg (4 mg Intravenous Given 12/09/16 1003)     Initial Impression / Assessment and Plan / ED Course  I have reviewed the triage vital signs and the nursing notes.  Pertinent labs & imaging results that were available during my care of the patient were reviewed by me and considered in my medical decision making (see chart for details).     Patient with workup for vertigo which was negative negative head CT negative MRI. Patient's vertigo symptoms somewhat resolved here. Patient had dizziness room spinning and nausea that has been present since  waking this morning. Had a little bit last evening. Patient has little bit of left-sided headache pressure sensation. Patient with recent travel to [place]. Workup here MRI head CT negative will treat symptomatically with meclizine for any recurrent vertigo and Zofran which she has at home for any recurrent nausea. Patient overall feels much better now. Patient will follow-up with primary care Dr. if symptoms persist.  Final Clinical Impressions(s) / ED Diagnoses   Final diagnoses:  Vertigo  Other headache syndrome    New Prescriptions New Prescriptions   MECLIZINE (ANTIVERT) 25 MG TABLET    Take 1 tablet (25 mg total) by mouth 3 (three) times daily as needed for dizziness.     Vanetta Mulders, MD 12/09/16 1336

## 2016-12-09 NOTE — ED Triage Notes (Addendum)
Pt reports generalized weakness, dizziness and nausea since waking this morning.  Pt states mild headache "pressure" this morning, bilateral, slightly worse on left side.  [place], returned home to [place] on March 5.  Pt reports 6 day course of prednisone upon return to states for arm pain that she had since receiving yellow fever vaccine in January, along with generalized muscle cramping.  Cramping symptoms and arm pain resolved with prednisone use.

## 2016-12-18 ENCOUNTER — Ambulatory Visit
Admission: RE | Admit: 2016-12-18 | Discharge: 2016-12-18 | Disposition: A | Discharge status: Home / Self Care | Payer: Medicare Other | Source: Ambulatory Visit | Attending: Family Medicine | Admitting: Family Medicine

## 2016-12-18 DIAGNOSIS — Z1231 Encounter for screening mammogram for malignant neoplasm of breast: Secondary | ICD-10-CM

## 2017-02-07 ENCOUNTER — Other Ambulatory Visit: Payer: Self-pay | Admitting: Family Medicine

## 2017-02-07 DIAGNOSIS — M7989 Other specified soft tissue disorders: Secondary | ICD-10-CM

## 2017-02-08 ENCOUNTER — Ambulatory Visit
Admission: RE | Admit: 2017-02-08 | Discharge: 2017-02-08 | Disposition: A | Discharge status: Home / Self Care | Payer: Medicare Other | Source: Ambulatory Visit | Attending: Family Medicine | Admitting: Family Medicine

## 2017-02-08 DIAGNOSIS — M7989 Other specified soft tissue disorders: Secondary | ICD-10-CM

## 2017-07-30 ENCOUNTER — Encounter (INDEPENDENT_AMBULATORY_CARE_PROVIDER_SITE_OTHER): Payer: Self-pay | Admitting: Orthopaedic Surgery

## 2017-07-30 ENCOUNTER — Ambulatory Visit (INDEPENDENT_AMBULATORY_CARE_PROVIDER_SITE_OTHER): Payer: Medicare Other | Admitting: Orthopaedic Surgery

## 2017-07-30 VITALS — BP 120/86 | HR 104 | Resp 14 | Ht 63.0 in | Wt 175.0 lb

## 2017-07-30 DIAGNOSIS — M25562 Pain in left knee: Secondary | ICD-10-CM | POA: Diagnosis not present

## 2017-07-30 NOTE — Progress Notes (Signed)
Office Visit Note   Patient: Maureen CootsLinda L Elliott           Date of Birth: 01-12-1946           MRN: [id number] Visit Date: 07/30/2017              Requested by: Sigmund HazelMiller, Lisa, MD [address] [place], [place] [address] PCP: Sigmund HazelMiller, Lisa, MD   Assessment & Plan: Visit Diagnoses:  1. Acute pain of left knee     Plan: direct injury to her left knee several weeks ago office 1 month if no improvement and consider further films and possible cortisone injectionwith evidence of chondromalacia patella. Instructed on activity modification.NSAIDs. Follow-Up Instructions: Return in about 1 month (around 08/29/2017).   Orders:  No orders of the defined types were placed in this encounter.  No orders of the defined types were placed in this encounter.     Procedures: No procedures performed   Clinical Data: No additional findings.   Subjective: Chief Complaint  Patient presents with  . Left Knee - Pain, Edema  . Left Leg - Pain, Edema    Maureen Elliott is a 71 y o that presents with Left knee pain. Left lower leg/shin is swollen from a fall 2 [place]. She fell down 6 stairs and then [facility] , xrays obtained, was told all was negative.   Still having some pain along the anterior aspect of her left knee particularly with maneuvering stairs or deep knee bends or squats.experiencing some pain along the anterior proximal tibia. No swelling. No numbness or tingling. Films at the after hours orthopedic clinic were negative for fracture. HPI  Review of Systems  Constitutional: Negative for chills, fatigue and fever.  Eyes: Negative for itching.  Respiratory: Negative for chest tightness and shortness of breath.   Cardiovascular: Negative for chest pain, palpitations and leg swelling.  Gastrointestinal: Negative for blood in stool, constipation and diarrhea.  Endocrine: Negative for polyuria.  Genitourinary: Negative for dysuria.    Musculoskeletal: Positive for joint swelling. Negative for back pain, neck pain and neck stiffness.  Allergic/Immunologic: Negative for immunocompromised state.  Neurological: Negative for dizziness and numbness.  Hematological: Does not bruise/bleed easily.  Psychiatric/Behavioral: The patient is not nervous/anxious.      Objective: Vital Signs: BP 120/86   Pulse (!) 104   Resp 14   Ht 5\' 3"  (1.6 m)   Wt 175 lb (79.4 kg)   BMI 31.00 kg/m   Physical Exam  Ortho Exam awake alert and oriented 3. Comfortable sitting. Positive patellar crepitation. No effusion. Resolving bruising anterior aspect of patella. No instability. No popliteal pain. No calf pain. Neurovascular exam intact.Mild anteromedial joint line pain. No ecchymosis or erythema. Specialty Comments:  No specialty comments available.  Imaging: No results found.   PMFS History: Patient Active Problem List   Diagnosis Date Noted  . Cystocele 01/13/2014   Past Medical History:  Diagnosis Date  . Complication of anesthesia    be careful with intubation due to previous cervical neck surgery  . Cystocele   . Diverticulosis   . Glaucoma   . Hemorrhoids internal and external  . History of gestational diabetes yrs ago  . Nocturia   . PONV (postoperative nausea and vomiting) 2013   n/v after phenergan, injectable phenergan helps with nausea    Family History  Problem Relation Age of Onset  . Breast cancer Maternal Aunt   .  Breast cancer Maternal Aunt   . Breast cancer Maternal Aunt     Past Surgical History:  Procedure Laterality Date  . ABDOMINAL HYSTERECTOMY  2013   ovaries removed   . BLADDER SURGERY    . BREAST BIOPSY Bilateral 2001  . cervical fusion C 5, C 6 and C 7  2004  . colonscopy  feb 2015   blood in stool, just was hemorrhoids  . EYE SURGERY Bilateral 8 yrs ago   lens replacement for cataract  . tummy tuck with mesh  6 yrs ago   large abdominal is present   Social History   Occupational  History  . Not on file  Tobacco Use  . Smoking status: Never Smoker  . Smokeless tobacco: Never Used  Substance and Sexual Activity  . Alcohol use: Yes    Comment: occasional  . Drug use: No  . Sexual activity: Not on file

## 2017-08-31 ENCOUNTER — Encounter (INDEPENDENT_AMBULATORY_CARE_PROVIDER_SITE_OTHER): Payer: Self-pay | Admitting: Orthopaedic Surgery

## 2017-08-31 ENCOUNTER — Ambulatory Visit (INDEPENDENT_AMBULATORY_CARE_PROVIDER_SITE_OTHER): Payer: Medicare Other | Admitting: Orthopaedic Surgery

## 2017-08-31 VITALS — Resp 14 | Ht 67.0 in | Wt 175.0 lb

## 2017-08-31 DIAGNOSIS — M25562 Pain in left knee: Secondary | ICD-10-CM | POA: Diagnosis not present

## 2017-08-31 DIAGNOSIS — M79672 Pain in left foot: Secondary | ICD-10-CM | POA: Diagnosis not present

## 2017-08-31 MED ORDER — LIDOCAINE HCL 1 % IJ SOLN
2.0000 mL | INTRAMUSCULAR | Status: AC | PRN
  Administered 2017-08-31: 2 mL

## 2017-08-31 MED ORDER — BUPIVACAINE HCL 0.5 % IJ SOLN
2.0000 mL | INTRAMUSCULAR | Status: AC | PRN
  Administered 2017-08-31: 2 mL via INTRA_ARTICULAR

## 2017-08-31 MED ORDER — METHYLPREDNISOLONE ACETATE 40 MG/ML IJ SUSP
80.0000 mg | INTRAMUSCULAR | Status: AC | PRN
  Administered 2017-08-31: 80 mg

## 2017-08-31 NOTE — Progress Notes (Signed)
Office Visit Note   Patient: Maureen CootsLinda L Elliott           Date of Birth: 07-18-1946           MRN: [id number] Visit Date: 08/31/2017              Requested by: Sigmund HazelMiller, Lisa, MD [address] [place], [place] [address] PCP: Sigmund HazelMiller, Lisa, MD   Assessment & Plan: Visit Diagnoses:  1. Acute pain of left knee   2. Left foot pain     Plan: Acute onset of left knee pain after a recent fall. 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 We'll try a cortisone injection and monitor her response. Also some chronic pain of her left ankle which appears to be lateral ankle sprain. I'll apply an ankle support  Follow-Up Instructions: Return if symptoms worsen or fail to improve.   Orders:  No orders of the defined types were placed in this encounter.  No orders of the defined types were placed in this encounter.     Procedures: Large Joint Inj: L knee on 08/31/2017 12:38 PM Indications: pain and diagnostic evaluation Details: 25 G 1.5 in needle, anteromedial approach  Arthrogram: No  Medications: 2 mL lidocaine 1 %; 2 mL bupivacaine 0.5 %; 80 mg methylPREDNISolone acetate 40 MG/ML Procedure, treatment alternatives, risks and benefits explained, specific risks discussed. Consent was given by the patient. Patient was prepped and draped in the usual sterile fashion.       Clinical Data: No additional findings.   Subjective: Chief Complaint  Patient presents with  . Left Knee - Pain, Edema    Maureen Elliott is a 71 y o here for chronic left knee pain mainly Still having some pain along the anterior aspect of her left knee. Ankle pain recently and swelling.  Fell directly on the anterior aspect of the left knee while visiting [place]. Her last office note asked her to return if no improvement over period of several weeks. Pain seems to be localized along the anterior aspect of her left knee and beneath the patella. Some crepitation. Some difficulty going up and down  stairs deep knee bends and squats. Also has some chronic problems with her left ankle which seems to be exacerbated from this recent fall.  HPI  Review of Systems  Constitutional: Negative for chills, fatigue and fever.  Eyes: Negative for itching.  Respiratory: Negative for chest tightness and shortness of breath.   Cardiovascular: Positive for leg swelling. Negative for chest pain and palpitations.  Gastrointestinal: Negative for blood in stool, constipation and diarrhea.  Endocrine: Negative for polyuria.  Genitourinary: Negative for dysuria.  Musculoskeletal: Negative for back pain, joint swelling, neck pain and neck stiffness.  Allergic/Immunologic: Negative for immunocompromised state.  Neurological: Negative for dizziness and numbness.  Hematological: Does not bruise/bleed easily.  Psychiatric/Behavioral: The patient is not nervous/anxious.      Objective: Vital Signs: Resp 14   Ht 5\' 7"  (1.702 m)   Wt 175 lb (79.4 kg)   BMI 27.41 kg/m   Physical Exam  Ortho Exam awake alert and oriented 3. Comfortable sitting. No effusion left knee. Some pain directly over the patella but without an obvious bursa. Small hematoma probably a centimeter. no redness. Some patellar crepitation. No medial lateral joint pain. No instability.  Left ankle with tenderness over the anterior talofibular ligament. No increased talar tilt. No redness or ecchymosis. Neurovascular exam intact  Specialty Comments:  No specialty comments available.  Imaging: No  results found.   PMFS History: Patient Active Problem List   Diagnosis Date Noted  . Cystocele 01/13/2014   Past Medical History:  Diagnosis Date  . Complication of anesthesia    be careful with intubation due to previous cervical neck surgery  . Cystocele   . Diverticulosis   . Glaucoma   . Hemorrhoids internal and external  . History of gestational diabetes yrs ago  . Nocturia   . PONV (postoperative nausea and vomiting) 2013    n/v after phenergan, injectable phenergan helps with nausea    Family History  Problem Relation Age of Onset  . Breast cancer Maternal Aunt   . Breast cancer Maternal Aunt   . Breast cancer Maternal Aunt     Past Surgical History:  Procedure Laterality Date  . ABDOMINAL HYSTERECTOMY  2013   ovaries removed   . BLADDER SURGERY    . BREAST BIOPSY Bilateral 2001  . cervical fusion C 5, C 6 and C 7  2004  . colonscopy  feb 2015   blood in stool, just was hemorrhoids  . CYSTOCELE REPAIR N/A 01/13/2014   Procedure: CYSTOSCOPY, REPAIR CYSTOCELE VAULT PROLAPSE/GRAFT;  Surgeon: Martina SinnerScott A MacDiarmid, MD;  Location: [facility];  Service: Urology;  Laterality: N/A;  . EYE SURGERY Bilateral 8 yrs ago   lens replacement for cataract  . tummy tuck with mesh  6 yrs ago   large abdominal is present   Social History   Occupational History  . Not on file  Tobacco Use  . Smoking status: Never Smoker  . Smokeless tobacco: Never Used  Substance and Sexual Activity  . Alcohol use: Yes    Comment: occasional  . Drug use: No  . Sexual activity: Not on file

## 2017-11-05 ENCOUNTER — Other Ambulatory Visit: Payer: Self-pay | Admitting: Family Medicine

## 2017-11-05 DIAGNOSIS — Z1231 Encounter for screening mammogram for malignant neoplasm of breast: Secondary | ICD-10-CM

## 2018-01-21 ENCOUNTER — Ambulatory Visit
Admission: RE | Admit: 2018-01-21 | Discharge: 2018-01-21 | Disposition: A | Discharge status: Home / Self Care | Payer: Medicare Other | Source: Ambulatory Visit | Attending: Family Medicine | Admitting: Family Medicine

## 2018-01-21 DIAGNOSIS — Z1231 Encounter for screening mammogram for malignant neoplasm of breast: Secondary | ICD-10-CM

## 2018-02-11 ENCOUNTER — Encounter (HOSPITAL_BASED_OUTPATIENT_CLINIC_OR_DEPARTMENT_OTHER): Payer: Self-pay | Admitting: Emergency Medicine

## 2018-02-11 ENCOUNTER — Emergency Department (HOSPITAL_BASED_OUTPATIENT_CLINIC_OR_DEPARTMENT_OTHER): Payer: Medicare Other

## 2018-02-11 ENCOUNTER — Emergency Department (HOSPITAL_BASED_OUTPATIENT_CLINIC_OR_DEPARTMENT_OTHER)
Admission: EM | Admit: 2018-02-11 | Discharge: 2018-02-11 | Disposition: A | Discharge status: Home / Self Care | Payer: Medicare Other | Attending: Physician Assistant | Admitting: Physician Assistant

## 2018-02-11 ENCOUNTER — Other Ambulatory Visit: Payer: Self-pay

## 2018-02-11 DIAGNOSIS — Z79899 Other long term (current) drug therapy: Secondary | ICD-10-CM | POA: Diagnosis not present

## 2018-02-11 DIAGNOSIS — R55 Syncope and collapse: Secondary | ICD-10-CM | POA: Diagnosis not present

## 2018-02-11 DIAGNOSIS — R42 Dizziness and giddiness: Secondary | ICD-10-CM | POA: Diagnosis present

## 2018-02-11 LAB — COMPREHENSIVE METABOLIC PANEL
ALK PHOS: 57 U/L (ref 38–126)
ALT: 18 U/L (ref 14–54)
AST: 24 U/L (ref 15–41)
Albumin: 4 g/dL (ref 3.5–5.0)
Anion gap: 14 (ref 5–15)
BUN: 15 mg/dL (ref 6–20)
CALCIUM: 9.7 mg/dL (ref 8.9–10.3)
CHLORIDE: 102 mmol/L (ref 101–111)
CO2: 20 mmol/L — AB (ref 22–32)
Creatinine, Ser: 0.88 mg/dL (ref 0.44–1.00)
GFR calc non Af Amer: >60 mL/min (ref >60)
Glucose, Bld: 142 mg/dL — ABNORMAL HIGH (ref 65–99)
Potassium: 3.3 mmol/L — ABNORMAL LOW (ref 3.5–5.1)
SODIUM: 136 mmol/L (ref 135–145)
Total Bilirubin: 0.8 mg/dL (ref 0.3–1.2)
Total Protein: 7.3 g/dL (ref 6.5–8.1)

## 2018-02-11 LAB — CBC WITH DIFFERENTIAL/PLATELET
BASOS PCT: 0 %
Basophils Absolute: 0 10*3/uL (ref 0.0–0.1)
EOS ABS: 0.1 10*3/uL (ref 0.0–0.7)
EOS PCT: 2 %
HCT: 38.6 % (ref 36.0–46.0)
Hemoglobin: 13.5 g/dL (ref 12.0–15.0)
LYMPHS ABS: 1.6 10*3/uL (ref 0.7–4.0)
Lymphocytes Relative: 29 %
MCH: 31 pg (ref 26.0–34.0)
MCHC: 35 g/dL (ref 30.0–36.0)
MCV: 88.5 fL (ref 78.0–100.0)
MONO ABS: 0.4 10*3/uL (ref 0.1–1.0)
MONOS PCT: 7 %
Neutro Abs: 3.5 10*3/uL (ref 1.7–7.7)
Neutrophils Relative %: 62 %
Platelets: 296 10*3/uL (ref 150–400)
RBC: 4.36 MIL/uL (ref 3.87–5.11)
RDW: 13 % (ref 11.5–15.5)
WBC: 5.6 10*3/uL (ref 4.0–10.5)

## 2018-02-11 LAB — URINALYSIS, ROUTINE W REFLEX MICROSCOPIC
BILIRUBIN URINE: NEGATIVE
Glucose, UA: NEGATIVE mg/dL
HGB URINE DIPSTICK: NEGATIVE
Ketones, ur: 40 mg/dL — AB
Nitrite: NEGATIVE
PROTEIN: NEGATIVE mg/dL
SPECIFIC GRAVITY, URINE: 1.01 (ref 1.005–1.030)
pH: 7 (ref 5.0–8.0)

## 2018-02-11 LAB — URINALYSIS, MICROSCOPIC (REFLEX)

## 2018-02-11 LAB — TROPONIN I

## 2018-02-11 MED ORDER — SODIUM CHLORIDE 0.9 % IV BOLUS
1000.0000 mL | Freq: Once | INTRAVENOUS | Status: AC
  Administered 2018-02-11: 1000 mL via INTRAVENOUS

## 2018-02-11 NOTE — ED Notes (Signed)
ED Provider at bedside. 

## 2018-02-11 NOTE — Discharge Instructions (Signed)
We can not find a reason for your symptoms.  Your labs and imaging were reassuring.  Please follow up with your PCP.

## 2018-02-11 NOTE — ED Notes (Signed)
Patient transported to X-ray 

## 2018-02-11 NOTE — ED Provider Notes (Signed)
[facility] EMERGENCY DEPARTMENT Provider Note   CSN: [id number] Arrival date & time: 02/11/18  1204     History   Chief Complaint Chief Complaint  Patient presents with  . Dizziness    HPI JOYCELINE Elliott is a 72 y.o. female.  HPI   Patient is a 72 year old female past medical history significant for bladder surgery and cervical fusion.  She is presenting today with feelings of presyncope.  Patient did her normal arc water aerobics this morning.  She reports getting out of the pool taking a hot shower and then feeling very heated.  She felt mildly swimmy headed.  Sat down vomited several times.  Called EMS.  Patient normal vital signs on arrival.  No chest pain no shortness of breath no leg swelling.  Patient still feels will be headache, no headache.  Patient does note that she spent a lot of time in hospital visiting a friend last week and was worried she might of caught something.  Past Medical History:  Diagnosis Date  . Complication of anesthesia    be careful with intubation due to previous cervical neck surgery  . Cystocele   . Diverticulosis   . Glaucoma   . Hemorrhoids internal and external  . History of gestational diabetes yrs ago  . Nocturia   . PONV (postoperative nausea and vomiting) 2013   n/v after phenergan, injectable phenergan helps with nausea    Patient Active Problem List   Diagnosis Date Noted  . Cystocele 01/13/2014    Past Surgical History:  Procedure Laterality Date  . ABDOMINAL HYSTERECTOMY  2013   ovaries removed   . BLADDER SURGERY    . BREAST BIOPSY Bilateral 2001  . cervical fusion C 5, C 6 and C 7  2004  . colonscopy  feb 2015   blood in stool, just was hemorrhoids  . CYSTOCELE REPAIR N/A 01/13/2014   Procedure: CYSTOSCOPY, REPAIR CYSTOCELE VAULT PROLAPSE/GRAFT;  Surgeon: Martina Sinner, MD;  Location: [facility];  Service: Urology;  Laterality: N/A;  . EYE SURGERY Bilateral 8 yrs ago   lens replacement for cataract    . tummy tuck with mesh  6 yrs ago   large abdominal is present     OB History   None      Home Medications    Prior to Admission medications   Medication Sig Start Date End Date Taking? Authorizing Provider  bimatoprost (LUMIGAN) 0.01 % SOLN 1 drop at bedtime.    [provider]  brimonidine (ALPHAGAN) 0.2 % ophthalmic solution 1 drop 3 (three) times daily.    [provider]  Calcium Carbonate-Vitamin D (CALCIUM-D PO) Take by mouth.    [provider]  Fish Oil OIL by Does not apply route.    [provider]  glucosamine-chondroitin 500-400 MG tablet Take 1 tablet by mouth 3 (three) times daily.    [provider]  HYDROcodone-acetaminophen (NORCO) 5-325 MG per tablet Take 1-2 tablets by mouth every 6 (six) hours as needed. Patient not taking: Reported on 07/30/2017 01/13/14   Harrie Foreman, PA-C  meclizine (ANTIVERT) 25 MG tablet Take 1 tablet (25 mg total) by mouth 3 (three) times daily as needed for dizziness. Patient not taking: Reported on 07/30/2017 12/09/16   Vanetta Mulders, MD  Multiple Vitamin (MULTIVITAMIN) tablet Take 1 tablet by mouth daily.    [provider]  ondansetron (ZOFRAN) 4 MG tablet Take 1 tablet (4 mg total) by mouth every 6 (six) hours.  Patient not taking: Reported on 07/30/2017 09/15/14   Rancour, Jeannett Senior, MD  Plant Sterols and Stanols (CHOLEST OFF PO) Take by mouth.    [provider]  timolol (TIMOPTIC) 0.25 % ophthalmic solution 1 drop 2 (two) times daily.    [provider]  VITAMIN D, CHOLECALCIFEROL, PO Take by mouth.    [provider]    Family History Family History  Problem Relation Age of Onset  . Breast cancer Maternal Aunt   . Breast cancer Maternal Aunt   . Breast cancer Maternal Aunt     Social History Social History   Tobacco Use  . Smoking status: Never Smoker  . Smokeless tobacco: Never Used  Substance Use Topics  . Alcohol use: Yes    Comment:  occasional  . Drug use: No     Allergies   Amoxicillin; Keflex [cephalexin]; and Levaquin [levofloxacin]   Review of Systems Review of Systems  Constitutional: Negative for activity change, fatigue and fever.  Respiratory: Negative for shortness of breath.   Cardiovascular: Negative for chest pain.  Gastrointestinal: Negative for abdominal pain.  Neurological: Positive for light-headedness.  All other systems reviewed and are negative.    Physical Exam Updated Vital Signs BP (!) 148/76   Pulse (!) 59   Temp 97.9 F (36.6 C) (Oral)   Resp 16   Ht  (1.6 m)   Wt 78.9 kg (174 lb)   SpO2 100%   BMI 30.82 kg/m   Physical Exam  Constitutional: She is oriented to person, place, and time. She appears well-developed and well-nourished.  HENT:  Head: Normocephalic and atraumatic.  Eyes: Right eye exhibits no discharge. Left eye exhibits no discharge.  Cardiovascular: Normal rate and regular rhythm.  No murmur heard. Pulmonary/Chest: Effort normal and breath sounds normal. No respiratory distress.  Abdominal: Soft. She exhibits no distension. There is no tenderness.  Neurological: She is oriented to person, place, and time.  Skin: Skin is warm and dry. She is not diaphoretic.  Psychiatric: She has a normal mood and affect.  Nursing note and vitals reviewed.    ED Treatments / Results  Labs (all labs ordered are listed, but only abnormal results are displayed) Labs Reviewed  COMPREHENSIVE METABOLIC PANEL - Abnormal; Notable for the following components:      Result Value   Potassium 3.3 (*)    CO2 20 (*)    Glucose, Bld 142 (*)    All other components within normal limits  URINALYSIS, ROUTINE W REFLEX MICROSCOPIC - Abnormal; Notable for the following components:   APPearance HAZY (*)    Ketones, ur 40 (*)    Leukocytes, UA SMALL (*)    All other components within normal limits  URINALYSIS, MICROSCOPIC (REFLEX) - Abnormal; Notable for the following components:     Bacteria, UA FEW (*)    All other components within normal limits  CBC WITH DIFFERENTIAL/PLATELET  TROPONIN I    EKG None  Radiology Dg Chest 2 View  Result Date: 02/11/2018 CLINICAL DATA:  Dizziness with nausea and vomiting EXAM: CHEST - 2 VIEW COMPARISON:  September 15, 2014 FINDINGS: There is slight left base atelectasis. Lungs elsewhere are clear. The heart size and pulmonary vascularity are normal. No adenopathy. There is aortic atherosclerosis. There is postoperative change in the lower cervical region. There is mild degenerative change in the thoracic spine. IMPRESSION: Slight left base atelectasis. No edema or consolidation. Heart size normal. There is aortic atherosclerosis. Aortic Atherosclerosis (ICD10-I70.0). Electronically Signed  By: Bretta Bang III M.D.   On: 02/11/2018 13:14   Ct Head Wo Contrast  Result Date: 02/11/2018 CLINICAL DATA:  Dizziness with nausea and vomiting EXAM: CT HEAD WITHOUT CONTRAST TECHNIQUE: Contiguous axial images were obtained from the base of the skull through the vertex without intravenous contrast. COMPARISON:  Head CT and brain MRI December 09, 2016 FINDINGS: Brain: Age related volume loss is stable. Prominence of the cisterna magna is an anatomic variant. There is no intracranial mass, hemorrhage, extra-axial fluid collection, or midline shift. Slight periventricular small vessel disease in the centra semiovale bilaterally is stable. No evident acute infarct. Vascular: No hyperdense vessel. There is calcification in the left carotid siphon region. Skull: There is a stable lucent area in the left frontal bone measuring 1.6 x 0.9 cm, a probable cyst or epidermoid. No new bone lesions are evident. Sinuses/Orbits: There is mucosal thickening in several ethmoid air cells. Other visualized paranasal sinuses are clear. Visualized orbits appear symmetric bilaterally. Other: Mastoid air cells are clear. There is debris in the right external auditory canal.  IMPRESSION: Stable age related volume loss with slight periventricular small vessel disease, also stable. No acute infarct. No mass or hemorrhage. Stable benign appearing lucent area in the left frontal bone, a likely cyst or epidermoid. No new bone lesions. Mild calcification in the left carotid siphon region. Mucosal thickening in several ethmoid air cells. Probable cerumen in the right external auditory canal. Electronically Signed   By: Bretta Bang III M.D.   On: 02/11/2018 13:20    Procedures Procedures (including critical care time)  Medications Ordered in ED Medications  sodium chloride 0.9 % bolus 1,000 mL (0 mLs Intravenous Stopped 02/11/18 1400)     Initial Impression / Assessment and Plan / ED Course  I have reviewed the triage vital signs and the nursing notes.  Pertinent labs & imaging results that were available during my care of the patient were reviewed by me and considered in my medical decision making (see chart for details).    Patient is a 72 year old female past medical history significant for bladder surgery and cervical fusion.  She is presenting today with feelings of presyncope.  Patient did her normal arc water aerobics this morning.  She reports getting out of the pool taking a hot shower and then feeling very heated.  She felt mildly swimmy headed.  Sat down vomited several times.  Called EMS.  Patient normal vital signs on arrival.  No chest pain no shortness of breath no leg swelling.  Patient still feels will be headache, no headache.  Patient does note that she spent a lot of time in hospital visiting a friend last week and was worried she might of caught something.  12:45 PM  Patient appears very well.  Suspect that patient got mild dehydration after the workout and hot shower, may have almost vagaled.  Patient looks well now. we will do labs, to be had evaluate for any infection, and hydrate.   BL TM, mild bulging, no erythema.   Patient is at  baseline, normal vitals, appears well.  Reassuring work up.   Susepct near syncope from overheating. Will discharge home.   Final Clinical Impressions(s) / ED Diagnoses   Final diagnoses:  Lightheadedness  Near syncope    ED Discharge Orders    None       Adonay Scheier, Cindee Salt, MD 02/11/18 1506

## 2018-02-11 NOTE — ED Triage Notes (Addendum)
Patient brought in via EMS  - states that she was doing water aerobics, and then got out of the Pool  - patient went in to get changed and she started to feel dizzy and had N/V  - the patient states that she is not dizzy when sitting up  - patient was given zofran enroute from ems

## 2018-09-03 ENCOUNTER — Other Ambulatory Visit: Payer: Self-pay | Admitting: Family Medicine

## 2018-09-03 DIAGNOSIS — E2839 Other primary ovarian failure: Secondary | ICD-10-CM

## 2018-09-11 ENCOUNTER — Other Ambulatory Visit (INDEPENDENT_AMBULATORY_CARE_PROVIDER_SITE_OTHER): Payer: Self-pay | Admitting: Otolaryngology

## 2018-09-11 DIAGNOSIS — H918X9 Other specified hearing loss, unspecified ear: Secondary | ICD-10-CM

## 2018-09-22 ENCOUNTER — Ambulatory Visit
Admission: RE | Admit: 2018-09-22 | Discharge: 2018-09-22 | Disposition: A | Discharge status: Home / Self Care | Payer: Medicare Other | Source: Ambulatory Visit | Attending: Otolaryngology | Admitting: Otolaryngology

## 2018-09-22 DIAGNOSIS — H918X9 Other specified hearing loss, unspecified ear: Secondary | ICD-10-CM

## 2018-09-22 MED ORDER — GADOBENATE DIMEGLUMINE 529 MG/ML IV SOLN
17.0000 mL | Freq: Once | INTRAVENOUS | Status: AC | PRN
  Administered 2018-09-22: 17 mL via INTRAVENOUS

## 2018-10-09 DIAGNOSIS — H401113 Primary open-angle glaucoma, right eye, severe stage: Secondary | ICD-10-CM | POA: Insufficient documentation

## 2018-10-24 ENCOUNTER — Other Ambulatory Visit: Payer: Self-pay

## 2018-10-24 ENCOUNTER — Emergency Department (HOSPITAL_BASED_OUTPATIENT_CLINIC_OR_DEPARTMENT_OTHER)
Admission: EM | Admit: 2018-10-24 | Discharge: 2018-10-24 | Disposition: A | Discharge status: Home / Self Care | Payer: Medicare Other | Attending: Emergency Medicine | Admitting: Emergency Medicine

## 2018-10-24 ENCOUNTER — Emergency Department (HOSPITAL_BASED_OUTPATIENT_CLINIC_OR_DEPARTMENT_OTHER): Payer: Medicare Other

## 2018-10-24 ENCOUNTER — Encounter (HOSPITAL_BASED_OUTPATIENT_CLINIC_OR_DEPARTMENT_OTHER): Payer: Self-pay

## 2018-10-24 DIAGNOSIS — Z79899 Other long term (current) drug therapy: Secondary | ICD-10-CM | POA: Insufficient documentation

## 2018-10-24 DIAGNOSIS — I82412 Acute embolism and thrombosis of left femoral vein: Secondary | ICD-10-CM

## 2018-10-24 DIAGNOSIS — R2242 Localized swelling, mass and lump, left lower limb: Secondary | ICD-10-CM | POA: Diagnosis present

## 2018-10-24 LAB — CBC WITH DIFFERENTIAL/PLATELET
Abs Immature Granulocytes: 0.01 10*3/uL (ref 0.00–0.07)
Basophils Absolute: 0 10*3/uL (ref 0.0–0.1)
Basophils Relative: 0 %
Eosinophils Absolute: 0.2 10*3/uL (ref 0.0–0.5)
Eosinophils Relative: 4 %
HCT: 38.6 % (ref 36.0–46.0)
Hemoglobin: 12.1 g/dL (ref 12.0–15.0)
Immature Granulocytes: 0 %
Lymphocytes Relative: 26 %
Lymphs Abs: 1.5 10*3/uL (ref 0.7–4.0)
MCH: 29 pg (ref 26.0–34.0)
MCHC: 31.3 g/dL (ref 30.0–36.0)
MCV: 92.6 fL (ref 80.0–100.0)
Monocytes Absolute: 0.7 10*3/uL (ref 0.1–1.0)
Monocytes Relative: 12 %
Neutro Abs: 3.2 10*3/uL (ref 1.7–7.7)
Neutrophils Relative %: 58 %
Platelets: 245 10*3/uL (ref 150–400)
RBC: 4.17 MIL/uL (ref 3.87–5.11)
RDW: 13.5 % (ref 11.5–15.5)
WBC: 5.6 10*3/uL (ref 4.0–10.5)
nRBC: 0 % (ref 0.0–0.2)

## 2018-10-24 LAB — BASIC METABOLIC PANEL
Anion gap: 7 (ref 5–15)
BUN: 13 mg/dL (ref 8–23)
CO2: 28 mmol/L (ref 22–32)
Calcium: 9.3 mg/dL (ref 8.9–10.3)
Chloride: 101 mmol/L (ref 98–111)
Creatinine, Ser: 0.76 mg/dL (ref 0.44–1.00)
GFR calc Af Amer: >60 mL/min (ref >60)
GFR calc non Af Amer: >60 mL/min (ref >60)
Glucose, Bld: 115 mg/dL — ABNORMAL HIGH (ref 70–99)
Potassium: 3.5 mmol/L (ref 3.5–5.1)
SODIUM: 136 mmol/L (ref 135–145)

## 2018-10-24 LAB — TROPONIN I: Troponin I: <0.03 ng/mL (ref <0.03)

## 2018-10-24 MED ORDER — RIVAROXABAN (XARELTO) EDUCATION KIT FOR DVT/PE PATIENTS
PACK | Freq: Once | Status: AC
  Administered 2018-10-24: 21:00:00

## 2018-10-24 MED ORDER — RIVAROXABAN 15 MG PO TABS
15.0000 mg | ORAL_TABLET | Freq: Once | ORAL | Status: AC
  Administered 2018-10-24: 15 mg via ORAL
  Filled 2018-10-24: qty 1

## 2018-10-24 MED ORDER — RIVAROXABAN (XARELTO) VTE STARTER PACK (15 & 20 MG): ORAL_TABLET | ORAL | 0 refills | Status: DC

## 2018-10-24 NOTE — ED Provider Notes (Signed)
[place] EMERGENCY DEPARTMENT Provider Note   CSN: [id number] Arrival date & time: 10/24/18  1833     History   Chief Complaint Chief Complaint  Patient presents with  . Leg Swelling    HPI Maureen Elliott is a 73 y.o. female here presenting with left leg pain and swelling.  Patient states that she had glaucoma surgery (shunt placement) about a week ago by Dr. Francee Nodal at [facility].  She was told to get some rest so she has been laying in the recliner for the last week or so.  She does not walk around that much until today.  She has progressive left calf pain and swelling for the last week.  Denies any chest pain or shortness of breath.  She was concerned that she may have a blood clot in her leg.   The history is provided by the patient.    Past Medical History:  Diagnosis Date  . Complication of anesthesia    be careful with intubation due to previous cervical neck surgery  . Cystocele   . Diverticulosis   . Glaucoma   . Hemorrhoids internal and external  . History of gestational diabetes yrs ago  . Nocturia   . PONV (postoperative nausea and vomiting) 2013   n/v after phenergan, injectable phenergan helps with nausea    Patient Active Problem List   Diagnosis Date Noted  . Cystocele 01/13/2014    Past Surgical History:  Procedure Laterality Date  . ABDOMINAL HYSTERECTOMY  2013   ovaries removed   . BLADDER SURGERY    . BREAST BIOPSY Bilateral 2001  . cervical fusion C 5, C 6 and C 7  2004  . colonscopy  feb 2015   blood in stool, just was hemorrhoids  . CYSTOCELE REPAIR N/A 01/13/2014   Procedure: CYSTOSCOPY, REPAIR CYSTOCELE VAULT PROLAPSE/GRAFT;  Surgeon: Reece Packer, MD;  Location: [facility];  Service: Urology;  Laterality: N/A;  . EYE SURGERY Bilateral 8 yrs ago   lens replacement for cataract  . GLAUCOMA SURGERY    . tummy tuck with mesh  6 yrs ago   large abdominal is present     OB History   No obstetric history on file.       Home Medications    Prior to Admission medications   Medication Sig Start Date End Date Taking? Authorizing Provider  bimatoprost (LUMIGAN) 0.01 % SOLN 1 drop at bedtime.    [provider]  brimonidine (ALPHAGAN) 0.2 % ophthalmic solution 1 drop 3 (three) times daily.    [provider]  Calcium Carbonate-Vitamin D (CALCIUM-D PO) Take by mouth.    [provider]  Fish Oil OIL by Does not apply route.    [provider]  glucosamine-chondroitin 500-400 MG tablet Take 1 tablet by mouth 3 (three) times daily.    [provider]  HYDROcodone-acetaminophen (NORCO) 5-325 MG per tablet Take 1-2 tablets by mouth every 6 (six) hours as needed. Patient not taking: Reported on 07/30/2017 01/13/14   Debbrah Alar, PA-C  meclizine (ANTIVERT) 25 MG tablet Take 1 tablet (25 mg total) by mouth 3 (three) times daily as needed for dizziness. Patient not taking: Reported on 07/30/2017 12/09/16   Fredia Sorrow, MD  Multiple Vitamin (MULTIVITAMIN) tablet Take 1 tablet by mouth daily.    [provider]  ondansetron (ZOFRAN) 4 MG tablet Take 1 tablet (4 mg total) by mouth every 6 (six) hours. Patient not taking: Reported on 07/30/2017 09/15/14  Rancour, Annie Main, MD  Plant Sterols and Stanols (CHOLEST OFF PO) Take by mouth.    [provider]  timolol (TIMOPTIC) 0.25 % ophthalmic solution 1 drop 2 (two) times daily.    [provider]  VITAMIN D, CHOLECALCIFEROL, PO Take by mouth.    [provider]    Family History Family History  Problem Relation Age of Onset  . Breast cancer Maternal Aunt   . Breast cancer Maternal Aunt   . Breast cancer Maternal Aunt     Social History Social History   Tobacco Use  . Smoking status: Never Smoker  . Smokeless tobacco: Never Used  Substance Use Topics  . Alcohol use: Yes    Comment: occasional  . Drug use: No     Allergies   Amoxicillin; Keflex [cephalexin]; and Levaquin  [levofloxacin]   Review of Systems Review of Systems  Musculoskeletal:       L leg pain and swelling   All other systems reviewed and are negative.    Physical Exam Updated Vital Signs BP (!) 159/81 (BP Location: Right Arm)   Pulse (!) 108   Temp 98.9 F (37.2 C) (Oral)   Resp 20   Ht '5\' 3"'$  (1.6 m)   Wt 82.1 kg   SpO2 99%   BMI 32.06 kg/m   Physical Exam Vitals signs and nursing note reviewed.  HENT:     Head: Normocephalic.     Nose: Nose normal.     Mouth/Throat:     Mouth: Mucous membranes are moist.  Eyes:     Extraocular Movements: Extraocular movements intact.     Pupils: Pupils are equal, round, and reactive to light.  Neck:     Musculoskeletal: Normal range of motion.  Cardiovascular:     Rate and Rhythm: Normal rate.  Pulmonary:     Effort: Pulmonary effort is normal.     Breath sounds: Normal breath sounds.  Abdominal:     General: Abdomen is flat.     Palpations: Abdomen is soft.  Musculoskeletal: Normal range of motion.     Comments: + left leg swelling and calf and thigh tenderness. Good DP and PT and popliteal pulses   Skin:    General: Skin is warm.     Capillary Refill: Capillary refill takes less than 2 seconds.  Neurological:     General: No focal deficit present.     Mental Status: She is alert.  Psychiatric:        Mood and Affect: Mood normal.      ED Treatments / Results  Labs (all labs ordered are listed, but only abnormal results are displayed) Labs Reviewed  BASIC METABOLIC PANEL - Abnormal; Notable for the following components:      Result Value   Glucose, Bld 115 (*)    All other components within normal limits  CBC WITH DIFFERENTIAL/PLATELET  TROPONIN I    EKG None  Radiology US Venous Img Lower Unilateral Left  Result Date: 10/24/2018 CLINICAL DATA:  Left lower extremity pain and swelling for 2 days. Recent surgery for glaucoma. EXAM: Left LOWER EXTREMITY VENOUS DOPPLER ULTRASOUND TECHNIQUE: Gray-scale sonography  with graded compression, as well as color Doppler and duplex ultrasound were performed to evaluate the lower extremity deep venous systems from the level of the common femoral vein and including the common femoral, femoral, profunda femoral, popliteal and calf veins including the posterior tibial, peroneal and gastrocnemius veins when visible. The superficial great saphenous vein was also interrogated.  Spectral Doppler was utilized to evaluate flow at rest and with distal augmentation maneuvers in the common femoral, femoral and popliteal veins. COMPARISON:  None. FINDINGS: Contralateral Common Femoral Vein: Respiratory phasicity is normal and symmetric with the symptomatic side. No evidence of thrombus. Normal compressibility. Common Femoral Vein: Occlusive expansile thrombus demonstrating echogenic filling defect with lack of color flow and compressibility. Saphenofemoral Junction: Occlusive expansile thrombus demonstrating echogenic filling defect with lack of color flow and compressibility. Profunda Femoral Vein: No evidence of thrombus. Normal compressibility and flow on color Doppler imaging. Femoral Vein: Occlusive expansile thrombus demonstrating echogenic filling defect with lack of color flow and compressibility. Popliteal Vein: Occlusive expansile thrombus demonstrating echogenic filling defect with lack of color flow and compressibility. Calf Veins: Occlusive expansile thrombus demonstrating echogenic filling defect with lack of color flow and compressibility. Superficial Great Saphenous Vein: Occlusive expansile thrombus demonstrating echogenic filling defect with lack of color flow and compressibility. Venous Reflux:  None. Other Findings:  None. IMPRESSION: Positive for acute deep venous thrombosis extending from the left common femoral vein through the left femoral vein, left popliteal vein, and into the left calf veins. These results were called by telephone at the time of interpretation on  10/24/2018 at 8:23 pm to Dr. Shirlyn Goltz , who verbally acknowledged these results. Electronically Signed   By: Lucienne Capers M.D.   On: 10/24/2018 20:25    Procedures Procedures (including critical care time)  Medications Ordered in ED Medications  Rivaroxaban (XARELTO) tablet 15 mg (15 mg Oral Given 10/24/18 2038)  rivaroxaban [employer] Reasons) Education Kit for DVT/PE patients ( Does not apply Given 10/24/18 2038)     Initial Impression / Assessment and Plan / ED Course  I have reviewed the triage vital signs and the nursing notes.  Pertinent labs & imaging results that were available during my care of the patient were reviewed by me and considered in my medical decision making (see chart for details).    Maureen Elliott is a 73 y.o. female here with L leg pain and swelling. Denies any chest pain or shortness of breath. She just had recent glaucoma surgery and has not been mobile so consider DVT. She is slightly tachycardic but has no SOB so I doubt PE.   9:01 PM Labs unremarkable. US showed proximal DVT. I talked to Dr. Posey Pronto from [facility] regarding starting anticoagulation and he states that there is no contra indication to start anticoagulation. Will start xarelto starter pack. Patient will need to follow up with PCP next week    Final Clinical Impressions(s) / ED Diagnoses   Final diagnoses:  None    ED Discharge Orders    None       Drenda Freeze, MD 10/24/18 2102

## 2018-10-24 NOTE — ED Notes (Signed)
ED Provider at bedside. 

## 2018-10-24 NOTE — Discharge Instructions (Addendum)
Take xarelto starter pack as prescribed.   See your doctor next week   Return to ER if you have trouble breathing, shortness of breath, chest pain, bleeding and bruising

## 2018-10-24 NOTE — ED Triage Notes (Signed)
C/o pain, swelling to left LE yesterday-states she had glaucoma surgery 9 days-NAD-steady gait

## 2018-10-24 NOTE — ED Notes (Signed)
PT states understanding of care given, follow up care, and medication prescribed. PT ambulated from ED to car with a steady gait. 

## 2018-11-07 ENCOUNTER — Ambulatory Visit
Admission: RE | Admit: 2018-11-07 | Discharge: 2018-11-07 | Disposition: A | Discharge status: Home / Self Care | Payer: Medicare Other | Source: Ambulatory Visit | Attending: Family Medicine | Admitting: Family Medicine

## 2018-11-07 DIAGNOSIS — E2839 Other primary ovarian failure: Secondary | ICD-10-CM

## 2018-12-25 ENCOUNTER — Other Ambulatory Visit: Payer: Self-pay | Admitting: Family Medicine

## 2018-12-25 DIAGNOSIS — I82492 Acute embolism and thrombosis of other specified deep vein of left lower extremity: Secondary | ICD-10-CM

## 2019-02-11 ENCOUNTER — Ambulatory Visit
Admission: RE | Admit: 2019-02-11 | Discharge: 2019-02-11 | Disposition: A | Discharge status: Home / Self Care | Payer: Medicare Other | Source: Ambulatory Visit | Attending: Family Medicine | Admitting: Family Medicine

## 2019-02-11 ENCOUNTER — Other Ambulatory Visit: Payer: Medicare Other

## 2019-02-11 DIAGNOSIS — I82492 Acute embolism and thrombosis of other specified deep vein of left lower extremity: Secondary | ICD-10-CM

## 2019-02-21 ENCOUNTER — Other Ambulatory Visit: Payer: Self-pay | Admitting: Family Medicine

## 2019-02-21 DIAGNOSIS — I82492 Acute embolism and thrombosis of other specified deep vein of left lower extremity: Secondary | ICD-10-CM

## 2019-02-24 ENCOUNTER — Other Ambulatory Visit: Payer: Self-pay | Admitting: Family Medicine

## 2019-02-24 DIAGNOSIS — Z1231 Encounter for screening mammogram for malignant neoplasm of breast: Secondary | ICD-10-CM

## 2019-04-09 ENCOUNTER — Other Ambulatory Visit: Payer: Self-pay

## 2019-04-09 ENCOUNTER — Emergency Department (HOSPITAL_BASED_OUTPATIENT_CLINIC_OR_DEPARTMENT_OTHER): Payer: Medicare Other

## 2019-04-09 ENCOUNTER — Encounter (HOSPITAL_BASED_OUTPATIENT_CLINIC_OR_DEPARTMENT_OTHER): Payer: Self-pay | Admitting: *Deleted

## 2019-04-09 ENCOUNTER — Emergency Department (HOSPITAL_BASED_OUTPATIENT_CLINIC_OR_DEPARTMENT_OTHER)
Admission: EM | Admit: 2019-04-09 | Discharge: 2019-04-09 | Disposition: A | Discharge status: Home / Self Care | Payer: Medicare Other | Attending: Emergency Medicine | Admitting: Emergency Medicine

## 2019-04-09 DIAGNOSIS — A059 Bacterial foodborne intoxication, unspecified: Secondary | ICD-10-CM | POA: Diagnosis not present

## 2019-04-09 DIAGNOSIS — Z79899 Other long term (current) drug therapy: Secondary | ICD-10-CM | POA: Insufficient documentation

## 2019-04-09 DIAGNOSIS — R109 Unspecified abdominal pain: Secondary | ICD-10-CM | POA: Diagnosis present

## 2019-04-09 LAB — LIPASE, BLOOD: Lipase: 21 U/L (ref 11–51)

## 2019-04-09 LAB — CBC
HCT: 42.4 % (ref 36.0–46.0)
Hemoglobin: 13.9 g/dL (ref 12.0–15.0)
MCH: 30.2 pg (ref 26.0–34.0)
MCHC: 32.8 g/dL (ref 30.0–36.0)
MCV: 92.2 fL (ref 80.0–100.0)
Platelets: 260 10*3/uL (ref 150–400)
RBC: 4.6 MIL/uL (ref 3.87–5.11)
RDW: 13.2 % (ref 11.5–15.5)
WBC: 12.7 10*3/uL — ABNORMAL HIGH (ref 4.0–10.5)
nRBC: 0 % (ref 0.0–0.2)

## 2019-04-09 LAB — COMPREHENSIVE METABOLIC PANEL
ALT: 22 U/L (ref 0–44)
AST: 22 U/L (ref 15–41)
Albumin: 4.1 g/dL (ref 3.5–5.0)
Alkaline Phosphatase: 55 U/L (ref 38–126)
Anion gap: 10 (ref 5–15)
BUN: 11 mg/dL (ref 8–23)
CO2: 24 mmol/L (ref 22–32)
Calcium: 9.4 mg/dL (ref 8.9–10.3)
Chloride: 97 mmol/L — ABNORMAL LOW (ref 98–111)
Creatinine, Ser: 0.78 mg/dL (ref 0.44–1.00)
GFR calc Af Amer: >60 mL/min (ref >60)
GFR calc non Af Amer: >60 mL/min (ref >60)
Glucose, Bld: 142 mg/dL — ABNORMAL HIGH (ref 70–99)
Potassium: 3.7 mmol/L (ref 3.5–5.1)
Sodium: 131 mmol/L — ABNORMAL LOW (ref 135–145)
Total Bilirubin: 0.6 mg/dL (ref 0.3–1.2)
Total Protein: 7.2 g/dL (ref 6.5–8.1)

## 2019-04-09 LAB — URINALYSIS, MICROSCOPIC (REFLEX): WBC, UA: NONE SEEN WBC/hpf (ref 0–5)

## 2019-04-09 LAB — URINALYSIS, ROUTINE W REFLEX MICROSCOPIC
Bilirubin Urine: NEGATIVE
Glucose, UA: NEGATIVE mg/dL
Ketones, ur: 40 mg/dL — AB
Leukocytes,Ua: NEGATIVE
Nitrite: NEGATIVE
Protein, ur: NEGATIVE mg/dL
Specific Gravity, Urine: 1.02 (ref 1.005–1.030)
pH: 6 (ref 5.0–8.0)

## 2019-04-09 MED ORDER — ONDANSETRON HCL 4 MG/2ML IJ SOLN
4.0000 mg | Freq: Once | INTRAMUSCULAR | Status: AC
  Administered 2019-04-09: 4 mg via INTRAVENOUS
  Filled 2019-04-09: qty 2

## 2019-04-09 MED ORDER — SODIUM CHLORIDE 0.9 % IV BOLUS (SEPSIS)
1000.0000 mL | Freq: Once | INTRAVENOUS | Status: AC
  Administered 2019-04-09: 1000 mL via INTRAVENOUS

## 2019-04-09 MED ORDER — SODIUM CHLORIDE 0.9 % IV SOLN
1000.0000 mL | INTRAVENOUS | Status: DC
  Administered 2019-04-09: 1000 mL via INTRAVENOUS

## 2019-04-09 MED ORDER — ONDANSETRON 8 MG PO TBDP: 8.0000 mg | ORAL_TABLET | Freq: Three times a day (TID) | ORAL | 0 refills | Status: DC | PRN

## 2019-04-09 NOTE — ED Triage Notes (Signed)
Pt c/o food poisoning x 4 hrs  C/o abd cramping and vomited

## 2019-04-09 NOTE — ED Provider Notes (Signed)
[facility] EMERGENCY DEPARTMENT Provider Note   CSN: [id number] Arrival date & time: 04/09/19  1809    History   Chief Complaint Chief Complaint  Patient presents with  . Abdominal Cramping    HPI Maureen Elliott is a 73 y.o. female.     HPI Pt states she ate some sushi from the grocery store today.  She bought some sushi and ate that.   A couple of hours after that she started having severe abdominal cramping as well as vomiting.  Her husband did not feel as bad but he did feel sick too.   The pain is in the center of her abdomen.  NO diarrhea.  SHe vomited multiple times since 5pm.  SHe is feeling somewhat better now after vomiting.  Past Medical History:  Diagnosis Date  . Complication of anesthesia    be careful with intubation due to previous cervical neck surgery  . Cystocele   . Diverticulosis   . Glaucoma   . Hemorrhoids internal and external  . History of gestational diabetes yrs ago  . Nocturia   . PONV (postoperative nausea and vomiting) 2013   n/v after phenergan, injectable phenergan helps with nausea    Patient Active Problem List   Diagnosis Date Noted  . Cystocele 01/13/2014    Past Surgical History:  Procedure Laterality Date  . ABDOMINAL HYSTERECTOMY  2013   ovaries removed   . BLADDER SURGERY    . BREAST BIOPSY Bilateral 2001  . cervical fusion C 5, C 6 and C 7  2004  . colonscopy  feb 2015   blood in stool, just was hemorrhoids  . CYSTOCELE REPAIR N/A 01/13/2014   Procedure: CYSTOSCOPY, REPAIR CYSTOCELE VAULT PROLAPSE/GRAFT;  Surgeon: Reece Packer, MD;  Location: [facility];  Service: Urology;  Laterality: N/A;  . EYE SURGERY Bilateral 8 yrs ago   lens replacement for cataract  . GLAUCOMA SURGERY    . tummy tuck with mesh  6 yrs ago   large abdominal is present     OB History   No obstetric history on file.      Home Medications    Prior to Admission medications   Medication Sig Start Date End Date Taking?  Authorizing Provider  bimatoprost (LUMIGAN) 0.01 % SOLN 1 drop at bedtime.    [provider]  brimonidine (ALPHAGAN) 0.2 % ophthalmic solution 1 drop 3 (three) times daily.    [provider]  Calcium Carbonate-Vitamin D (CALCIUM-D PO) Take by mouth.    [provider]  Fish Oil OIL by Does not apply route.    [provider]  glucosamine-chondroitin 500-400 MG tablet Take 1 tablet by mouth 3 (three) times daily.    [provider]  HYDROcodone-acetaminophen (NORCO) 5-325 MG per tablet Take 1-2 tablets by mouth every 6 (six) hours as needed. Patient not taking: Reported on 07/30/2017 01/13/14   Debbrah Alar, PA-C  meclizine (ANTIVERT) 25 MG tablet Take 1 tablet (25 mg total) by mouth 3 (three) times daily as needed for dizziness. Patient not taking: Reported on 07/30/2017 12/09/16   Fredia Sorrow, MD  Multiple Vitamin (MULTIVITAMIN) tablet Take 1 tablet by mouth daily.    [provider]  ondansetron (ZOFRAN ODT) 8 MG disintegrating tablet Take 1 tablet (8 mg total) by mouth every 8 (eight) hours as needed for nausea or vomiting. 04/09/19   Dorie Rank, MD  ondansetron (ZOFRAN) 4 MG tablet Take 1 tablet (4 mg total) by mouth  every 6 (six) hours. Patient not taking: Reported on 07/30/2017 09/15/14   Rancour, Jeannett SeniorStephen, MD  Plant Sterols and Stanols (CHOLEST OFF PO) Take by mouth.    [provider]  Rivaroxaban 15 & 20 MG TBPK Take as directed on package: Start with one 15mg  tablet by mouth twice a day with food. On Day 22, switch to one 20mg  tablet once a day with food. 10/24/18   Charlynne PanderYao, David Hsienta, MD  timolol (TIMOPTIC) 0.25 % ophthalmic solution 1 drop 2 (two) times daily.    [provider]  VITAMIN D, CHOLECALCIFEROL, PO Take by mouth.    [provider]    Family History Family History  Problem Relation Age of Onset  . Breast cancer Maternal Aunt   . Breast cancer Maternal Aunt   . Breast cancer Maternal Aunt      Social History Social History   Tobacco Use  . Smoking status: Never Smoker  . Smokeless tobacco: Never Used  Substance Use Topics  . Alcohol use: Yes    Comment: occasional  . Drug use: No     Allergies   Amoxicillin, Keflex [cephalexin], and Levaquin [levofloxacin]   Review of Systems Review of Systems  All other systems reviewed and are negative.    Physical Exam Updated Vital Signs BP (!) 142/59 (BP Location: Left Arm)   Pulse 70   Temp 98.1 F (36.7 C)   Resp 18   Ht 1.613 m (5' 3.5")   Wt 76.7 kg   SpO2 99%   BMI 29.47 kg/m   Physical Exam Vitals signs and nursing note reviewed.  Constitutional:      General: She is not in acute distress.    Appearance: She is well-developed.  HENT:     Head: Normocephalic and atraumatic.     Right Ear: External ear normal.     Left Ear: External ear normal.  Eyes:     General: No scleral icterus.       Right eye: No discharge.        Left eye: No discharge.     Conjunctiva/sclera: Conjunctivae normal.  Neck:     Musculoskeletal: Neck supple.     Trachea: No tracheal deviation.  Cardiovascular:     Rate and Rhythm: Normal rate and regular rhythm.  Pulmonary:     Effort: Pulmonary effort is normal. No respiratory distress.     Breath sounds: Normal breath sounds. No stridor. No wheezing or rales.  Abdominal:     General: Bowel sounds are normal. There is no distension.     Palpations: Abdomen is soft.     Tenderness: There is no abdominal tenderness. There is no guarding or rebound.  Musculoskeletal:        General: No tenderness.  Skin:    General: Skin is warm and dry.     Findings: No rash.  Neurological:     Mental Status: She is alert.     Cranial Nerves: No cranial nerve deficit (no facial droop, extraocular movements intact, no slurred speech).     Sensory: No sensory deficit.     Motor: No abnormal muscle tone or seizure activity.     Coordination: Coordination normal.      ED Treatments  / Results  Labs (all labs ordered are listed, but only abnormal results are displayed) Labs Reviewed  URINALYSIS, ROUTINE W REFLEX MICROSCOPIC - Abnormal; Notable for the following components:      Result Value   Hgb urine dipstick  SMALL (*)    Ketones, ur 40 (*)    All other components within normal limits  URINALYSIS, MICROSCOPIC (REFLEX) - Abnormal; Notable for the following components:   Bacteria, UA RARE (*)    All other components within normal limits  CBC - Abnormal; Notable for the following components:   WBC 12.7 (*)    All other components within normal limits  COMPREHENSIVE METABOLIC PANEL - Abnormal; Notable for the following components:   Sodium 131 (*)    Chloride 97 (*)    Glucose, Bld 142 (*)    All other components within normal limits  LIPASE, BLOOD    EKG None  Radiology Dg Abdomen Acute W/chest  Result Date: 04/09/2019 CLINICAL DATA:  Abdominal pain, vomiting, suspected food poisoning EXAM: DG ABDOMEN ACUTE W/ 1V CHEST COMPARISON:  Acute abdominal series 09/15/2014 FINDINGS: There is no evidence of dilated bowel loops or free intraperitoneal air. Few punctate radiodensities project over the left renal shadow., possible calcifications versus bowel material. Additional phleboliths are noted in the pelvis. Multiple surgical clips are present in the right and left abdomen. Heart size and mediastinal contours are within normal limits. Both lungs are clear. Levocurvature of the thoracolumbar spine. Degenerative changes in the shoulders, spine and hips. Anterior cervical spinal fusion hardware is noted. IMPRESSION: 1. Nonobstructive bowel gas pattern. 2. Possible left nephrolithiasis. 3. No acute cardiopulmonary disease. Electronically Signed   By: Kreg ShropshirePrice  DeHay M.D.   On: 04/09/2019 21:22    Procedures Procedures (including critical care time)  Medications Ordered in ED Medications  sodium chloride 0.9 % bolus 1,000 mL (0 mLs Intravenous Stopped 04/09/19 2117)     Followed by  0.9 %  sodium chloride infusion (1,000 mLs Intravenous New Bag/Given 04/09/19 2018)  ondansetron (ZOFRAN) injection 4 mg (4 mg Intravenous Given 04/09/19 2011)     Initial Impression / Assessment and Plan / ED Course  I have reviewed the triage vital signs and the nursing notes.  Pertinent labs & imaging results that were available during my care of the patient were reviewed by me and considered in my medical decision making (see chart for details).  Clinical Course as of Apr 09 2207  Wed Apr 09, 2019  2207 Labs reviewed.  Leukocytosis noted.  Urinalysis not suggestive of urinary tract infection.  Mild electrolyte abnormalities noted.  Ketones in the urine consistent with dehydration.   [JK]  2207 Patient improved with IV fluids and antiemetics.  On repeat abdominal exam she has no tenderness.  Patient was able to tolerate fluids.   [JK]    Clinical Course User Index [JK] Linwood DibblesKnapp, Johanna Stafford, MD     Abdominal exam is benign.  I doubt to colitis, colitis,, appendicitis or bowel obstruction.  Symptoms are most likely related to foodborne illness.  Patient's husband had the same symptoms after eating the same food.  She improved with IV fluids and antiemetics.  Patient is able to tolerate oral fluids.  She is stable for discharge at this time.  Discussed the importance of returning to the ED if she starts having trouble with fevers or worsening symptoms.  Final Clinical Impressions(s) / ED Diagnoses   Final diagnoses:  Food poisoning    ED Discharge Orders         Ordered    ondansetron (ZOFRAN ODT) 8 MG disintegrating tablet  Every 8 hours PRN     04/09/19 2205           Linwood DibblesKnapp, Yamel Bale, MD 04/09/19 2209

## 2019-04-09 NOTE — ED Notes (Signed)
ED Provider at bedside. 

## 2019-04-09 NOTE — Discharge Instructions (Signed)
Drink plenty of fluids, take the medications for nausea as needed

## 2019-04-11 ENCOUNTER — Ambulatory Visit
Admission: RE | Admit: 2019-04-11 | Discharge: 2019-04-11 | Disposition: A | Discharge status: Home / Self Care | Payer: Medicare Other | Source: Ambulatory Visit | Attending: Family Medicine | Admitting: Family Medicine

## 2019-04-11 ENCOUNTER — Other Ambulatory Visit: Payer: Self-pay

## 2019-04-11 DIAGNOSIS — Z1231 Encounter for screening mammogram for malignant neoplasm of breast: Secondary | ICD-10-CM

## 2019-05-05 ENCOUNTER — Ambulatory Visit
Admission: RE | Admit: 2019-05-05 | Discharge: 2019-05-05 | Disposition: A | Discharge status: Home / Self Care | Payer: Medicare Other | Source: Ambulatory Visit | Attending: Family Medicine | Admitting: Family Medicine

## 2019-05-05 DIAGNOSIS — I82492 Acute embolism and thrombosis of other specified deep vein of left lower extremity: Secondary | ICD-10-CM

## 2019-10-21 ENCOUNTER — Ambulatory Visit: Payer: Medicare PPO | Admitting: Neurology

## 2019-10-21 ENCOUNTER — Other Ambulatory Visit: Payer: Self-pay

## 2019-10-21 ENCOUNTER — Encounter: Payer: Self-pay | Admitting: Neurology

## 2019-10-21 VITALS — BP 188/94 | HR 90 | Temp 97.7°F | Ht 64.0 in | Wt 172.0 lb

## 2019-10-21 DIAGNOSIS — H9193 Unspecified hearing loss, bilateral: Secondary | ICD-10-CM | POA: Diagnosis not present

## 2019-10-21 DIAGNOSIS — H209 Unspecified iridocyclitis: Secondary | ICD-10-CM | POA: Diagnosis not present

## 2019-10-21 DIAGNOSIS — R200 Anesthesia of skin: Secondary | ICD-10-CM | POA: Diagnosis not present

## 2019-10-21 NOTE — Progress Notes (Signed)
[facility]  PATIENT: Maureen Elliott DOB: Jun 03, 1946   REFERRING DOCTOR OR PCP:  Kathyrn Lass (PCP); Dr. Manuella Ghazi (Ophthalmology) SOURCE: Patient, notes from ophthalmology, laboratory reports, imaging reports, MRI images personally reviewed.  _________________________________   HISTORICAL  CHIEF COMPLAINT:  Chief Complaint  Patient presents with  . New Patient (Initial Visit)    RM 12, alone. Paper referral from Dr. Manuella Ghazi for peripheral neuropathy.     HISTORY OF PRESENT ILLNESS:  I had the pleasure seeing your patient, Maureen Elliott, at [facility] for neurologic consultation regarding her recent uveitis and history of peripheral neuropathy.  She is a 74 year old woman recently diagnosed with uveitis who had numbness, tingling gait difficulties and hearing loss several months ago.   She has been seen Drs. Davanzo, Treadwell,Tsamis, Bond and Shah for primary open-angle glaucoma over the past several years.    In September and early October, she had inflammation of the eyes and was placed on erythromycin ointment for 2 weeks then doxycycline but had no improvement. Then she was placed on Valtrex but stopped due to an allergic reaction with facial swelling, eyelid swelling and headache.  Additionally intraocular pressures increased.   She was diagnosed with inflammatory chorioretinitis.    She had multiple blood test 07/15/2019.  The soluble interferon 2 receptor (CD25) was elevated.  Rheumatoid factor, ANA, ANCA, Lyme, anti-DNase B antibody, ACE, myeloproteinase 3 order antibody, SPEP, NMO IgG, myeloperoxidase antibody, toxoplasma antibodies were all negative or normal.  She was placed on methotrexate for [redacted] weeks along with acetazolamide but she  developed hand and foot numbness and hearing loss.   The methotrexate was stopped.   Acetazolamide was stopped a little later.      The tingling is doing better in her hands but she still has hearing loss  without much improvement.   Both ears have similar hearing loss.     She has been seeing Dr. Benjamine Mola (ENT) and she just got hearing aids.    She reports being told that the methotrexate probably because the hearing loss.  She felt her balance was worse while on the combination of methotrexate and Diamox but that has since improved.    She felt more tired on these medications as well.   She feels the swelling in the face has continued to worsen over the past few weeks.  Additionally, she feels that she is not focusing as well over the past few months.  An intraocular dexamethasone implant has been discussed with her (Ozurdex) but she is reluctant to proceed at this time.  She had an MRI of the brain performed 09/22/2018 due to right sensorineural hearing loss.  I personally reviewed the MRI images.  The internal auditory canals appear normal.  There is a mild right mastoid effusion.  The brain shows age-related mild atrophy and chronic microvascular ischemic changes.Marland Kitchen   REVIEW OF SYSTEMS: Constitutional: No fevers, chills, sweats, or change in appetite Eyes: see above Ear, nose and throat: Reports hearing loss.  No ear pain, nasal congestion, sore throat Cardiovascular: No chest pain, palpitations Respiratory: No shortness of breath at rest or with exertion.   No wheezes GastrointestinaI: No nausea, vomiting, diarrhea, abdominal pain, fecal incontinence Genitourinary: No dysuria, urinary retention or frequency.  No nocturia. Musculoskeletal: No neck pain, back pain Integumentary: No rash, pruritus, skin lesions Neurological: as above Psychiatric: No depression at this time.  No anxiety Endocrine: No palpitations, diaphoresis, change in appetite, change in weigh or increased thirst Hematologic/Lymphatic: No  anemia, purpura, petechiae. Allergic/Immunologic: No itchy/runny eyes, nasal congestion, recent allergic reactions, rashes  ALLERGIES: Allergies  Allergen Reactions  . Amoxicillin  Diarrhea    Normal adverse effect  . Dorzolamide     Severe eye irritation  . Keflex [Cephalexin] Diarrhea    Normal adverse effect  . Levaquin [Levofloxacin]     Joint pain  . Maxitrol [Neomycin-Polymyxin-Dexameth]     Burns eyes  . Methotrexate Derivatives     Hearing damage  . Other     Cannot take prednisone or caffeine d/t hx glaucoma  . Restasis [Cyclosporine]     Burns eyes  . Valtrex [Valacyclovir]     Face swelling, headache on left side, increased eye pressure    HOME MEDICATIONS:  Current Outpatient Medications:  .  bimatoprost (LUMIGAN) 0.01 % SOLN, 1 drop at bedtime., Disp: , Rfl:  .  brimonidine (ALPHAGAN) 0.2 % ophthalmic solution, 1 drop 3 (three) times daily., Disp: , Rfl:  .  Carboxymethylcell-Hypromellose (GENTEAL OP), Apply 1 Dose to eye as needed., Disp: , Rfl:  .  conjugated estrogens (PREMARIN) vaginal cream, Place 1 Applicatorful vaginally 2 (two) times a week., Disp: , Rfl:  .  timolol (TIMOPTIC) 0.25 % ophthalmic solution, 1 drop 2 (two) times daily., Disp: , Rfl:   PAST MEDICAL HISTORY: Past Medical History:  Diagnosis Date  . Complication of anesthesia    be careful with intubation due to previous cervical neck surgery  . Cystocele   . Diverticulosis   . Glaucoma   . Hemorrhoids internal and external  . History of gestational diabetes yrs ago  . Nocturia   . PONV (postoperative nausea and vomiting) 2013   n/v after phenergan, injectable phenergan helps with nausea    PAST SURGICAL HISTORY: Past Surgical History:  Procedure Laterality Date  . ABDOMINAL HYSTERECTOMY  2013   ovaries removed   . BLADDER SURGERY    . BREAST BIOPSY Bilateral 2001  . cervical fusion C 5, C 6 and C 7  2004  . colonscopy  feb 2015   blood in stool, just was hemorrhoids  . CYSTOCELE REPAIR N/A 01/13/2014   Procedure: CYSTOSCOPY, REPAIR CYSTOCELE VAULT PROLAPSE/GRAFT;  Surgeon: Martina Sinner, MD;  Location: [facility];  Service: Urology;  Laterality: N/A;  .  EYE SURGERY Bilateral 8 yrs ago   lens replacement for cataract  . GLAUCOMA SURGERY    . tummy tuck with mesh  6 yrs ago   large abdominal is present    FAMILY HISTORY: Family History  Problem Relation Age of Onset  . Breast cancer Maternal Aunt   . Breast cancer Maternal Aunt   . Breast cancer Maternal Aunt     SOCIAL HISTORY:  Social History   Socioeconomic History  . Marital status: Married    Spouse name: Jake Shark "will"  . Number of children: 1  . Years of education: Not on file  . Highest education level: Not on file  Occupational History  . Not on file  Tobacco Use  . Smoking status: Never Smoker  . Smokeless tobacco: Never Used  Substance and Sexual Activity  . Alcohol use: Yes    Comment: occasional wine with dinner  . Drug use: No  . Sexual activity: Not on file  Other Topics Concern  . Not on file  Social History Narrative   Right handed    Lives with husband   Social Determinants of Health   Financial Resource Strain:   . Difficulty of Paying Living  Expenses: Not on file  Food Insecurity:   . Worried About Programme researcher, broadcasting/film/video in the Last Year: Not on file  . Ran Out of Food in the Last Year: Not on file  Transportation Needs:   . Lack of Transportation (Medical): Not on file  . Lack of Transportation (Non-Medical): Not on file  Physical Activity:   . Days of Exercise per Week: Not on file  . Minutes of Exercise per Session: Not on file  Stress:   . Feeling of Stress : Not on file  Social Connections:   . Frequency of Communication with Friends and Family: Not on file  . Frequency of Social Gatherings with Friends and Family: Not on file  . Attends Religious Services: Not on file  . Active Member of Clubs or Organizations: Not on file  . Attends Banker Meetings: Not on file  . Marital Status: Not on file  Intimate Partner Violence:   . Fear of Current or Ex-Partner: Not on file  . Emotionally Abused: Not on file  . Physically  Abused: Not on file  . Sexually Abused: Not on file     PHYSICAL EXAM  Vitals:   10/21/19 1415  BP: (!) 188/94  Pulse: 90  Temp: 97.7 F (36.5 C)  Weight: 172 lb (78 kg)  Height: 5\' 4"  (1.626 m)    Body mass index is 29.52 kg/m.  REPEAT BP 165/85.    General: The patient is well-developed and well-nourished and in no acute distress  HEENT:  Head is [place]/AT.  Sclera are erythematous, left > right.  Funduscopic exam looks normal OD and I can't focus well on disc OS.   Neck: No carotid bruits are noted.  The neck is nontender.  Cardiovascular: The heart has a regular rate and rhythm with a normal S1 and S2. There were no murmurs, gallops or rubs.    Skin: Extremities are without rash or  edema.  Musculoskeletal:  Back is nontender  Neurologic Exam  Mental status: The patient is alert and oriented x 3 at the time of the examination. The patient has apparent normal recent and remote memory, with an apparently normal attention span and concentration ability.   Speech is normal.  Cranial nerves: Extraocular movements are full. Pupils are equal, round, and reactive to light and accomodation.  Visual fields are full.  Facial symmetry is present. There is good facial sensation to soft touch bilaterally.Facial strength is normal.  Trapezius and sternocleidomastoid strength is normal. No dysarthria is noted.  The tongue is midline, and the patient has symmetric elevation of the soft palate. She has hearing aids.  Motor:  Muscle bulk is normal.   Tone is normal. Strength is  5 / 5 in all 4 extremities.   Sensory: Sensory testing is intact to pinprick, soft touch and vibration sensation in arms and toes only about 10% less than knees (nomal)  Coordination: Cerebellar testing reveals good finger-nose-finger and heel-to-shin bilaterally.  Gait and station: Station is normal.   Gait is arthritic. Tandem gait is wide. Romberg is negative.   Reflexes: Deep tendon reflexes are symmetric and  normal bilaterally.   Plantar responses are flexor.     DIAGNOSTIC DATA (LABS, IMAGING, TESTING) - I reviewed patient records, labs, notes, testing and imaging myself where available.  Lab Results  Component Value Date   WBC 12.7 (H) 04/09/2019   HGB 13.9 04/09/2019   HCT 42.4 04/09/2019   MCV 92.2 04/09/2019   PLT  260 04/09/2019      Component Value Date/Time   NA 131 (L) 04/09/2019 2018   K 3.7 04/09/2019 2018   CL 97 (L) 04/09/2019 2018   CO2 24 04/09/2019 2018   GLUCOSE 142 (H) 04/09/2019 2018   BUN 11 04/09/2019 2018   CREATININE 0.78 04/09/2019 2018   CALCIUM 9.4 04/09/2019 2018   PROT 7.2 04/09/2019 2018   ALBUMIN 4.1 04/09/2019 2018   AST 22 04/09/2019 2018   ALT 22 04/09/2019 2018   ALKPHOS 55 04/09/2019 2018   BILITOT 0.6 04/09/2019 2018   GFRNONAA >60 04/09/2019 2018   GFRAA >60 04/09/2019 2018       ASSESSMENT AND PLAN  Uveitis - Plan: CBC with Differential/Platelet, Angiotensin converting enzyme, MR BRAIN W WO CONTRAST, MR ORBITS W WO CONTRAST  Numbness - Plan: MR BRAIN W WO CONTRAST  Bilateral hearing loss, unspecified hearing loss type   In summary, Ms. Swinford is a 74 year old woman who had the onset of numbness/tingling and hearing loss about 3 months ago, shortly after starting methotrexate and acetazolamide for uveitis.  The numbness in the hands and feet has since resolved and currently she does not have evidence of any significant sensory abnormality on neurologic examination.  She continues to report hearing loss that has not changed over the past couple months.  Most likely, the acetazolamide was associated with the numbness and tingling as it resolved a few days after the medications were discontinued.  She reports being told that the methotrexate likely caused the hearing loss.  I did not see that listed as a side effect in the prescribing information or in a pubmed search.  She had elevated soluble IL-2 receptor (CD25) which can be seen in  sarcoid which could be related to her uveitis.  I will check an MRI of the brain and orbits to determine if there is any evidence of neurosarcoid, 8th nerve disorder or other abnormality that could be causing the hearing loss or contributing to other symptoms.  We will also evaluate for orbital abnormalities.  Since the numbness has resolved, there is no need for a nerve conduction study or further evaluation in that regard.  We will let her know the results of the MRI.  She will return as needed if new symptoms or based on the findings of the MRI.  I discussed with her that if sarcoid is causing the uveitis steroids might be of benefit.  Thank you for asking me to see Ms. Kazee .  Please let me know if I can be of further assistance with her or other patients in the future.       Richard A. Epimenio Foot, MD, [facility] 10/21/2019, 9:46 PM Certified in Neurology, Clinical Neurophysiology, Sleep Medicine and Neuroimaging  [facility] [address], [address] [place], [place] [address] [phone]

## 2019-10-22 ENCOUNTER — Telehealth: Payer: Self-pay | Admitting: *Deleted

## 2019-10-22 LAB — CBC WITH DIFFERENTIAL/PLATELET
Basophils Absolute: 0 10*3/uL (ref 0.0–0.2)
Basos: 1 %
EOS (ABSOLUTE): 0.2 10*3/uL (ref 0.0–0.4)
Eos: 3 %
Hematocrit: 41.3 % (ref 34.0–46.6)
Hemoglobin: 13.2 g/dL (ref 11.1–15.9)
Immature Grans (Abs): 0 10*3/uL (ref 0.0–0.1)
Immature Granulocytes: 0 %
Lymphocytes Absolute: 1.4 10*3/uL (ref 0.7–3.1)
Lymphs: 23 %
MCH: 30.3 pg (ref 26.6–33.0)
MCHC: 32 g/dL (ref 31.5–35.7)
MCV: 95 fL (ref 79–97)
Monocytes Absolute: 0.5 10*3/uL (ref 0.1–0.9)
Monocytes: 8 %
Neutrophils Absolute: 4.1 10*3/uL (ref 1.4–7.0)
Neutrophils: 65 %
Platelets: 314 10*3/uL (ref 150–450)
RBC: 4.36 x10E6/uL (ref 3.77–5.28)
RDW: 12.6 % (ref 11.7–15.4)
WBC: 6.1 10*3/uL (ref 3.4–10.8)

## 2019-10-22 LAB — ANGIOTENSIN CONVERTING ENZYME: Angio Convert Enzyme: 40 U/L (ref 14–82)

## 2019-10-22 NOTE — Telephone Encounter (Signed)
Called, LVM for pt relaying results per Dr. Epimenio Foot note. Advised she did not need to call back unless had further questions.

## 2019-10-22 NOTE — Telephone Encounter (Signed)
-----   Message from Asa Lente, MD sent at 10/22/2019 12:21 PM EST ----- Please let the patient know that the lab work is fine.

## 2019-11-03 ENCOUNTER — Telehealth: Payer: Self-pay | Admitting: Neurology

## 2019-11-03 NOTE — Telephone Encounter (Signed)
[employer] pending faxed notes.  

## 2019-11-06 NOTE — Telephone Encounter (Signed)
Called [employer] to check the status and stated it is pending with nurse review.

## 2019-11-10 NOTE — Telephone Encounter (Signed)
Thank you :)

## 2019-11-10 NOTE — Telephone Encounter (Signed)
Hopefully the insurance will approve before the 18th.  If they do not, we will need to just proceed with the brain

## 2019-11-10 NOTE — Telephone Encounter (Signed)
Patient called for the second time today. She is upset that it is not approved. I explained to her that it is pending her insurance. She is upset and wanted to know if Dr. Epimenio Foot documented everything in her notes correctly. I assured her that he did. She called her insurance and they told her that the MRI Brain was approved but it was pending the orbits. She wants to know if she can go have the Brain w/wo done because she has an apt next week that she will need these results for. She is going to call her insurance and see what the hold up is. Please advise if she should just have MRI Brain w/wo for now.   We have faxed to her insurance on the 8th. 9th and 11th of this month and have also been in contact with them over the phone. We are currently waiting on them.

## 2019-11-10 NOTE — Telephone Encounter (Signed)
Maureen Elliott at [facility] is scheduling the patient for both scans on Thursday the 18th at 1:30. I will wait for decision from Dr. Epimenio Foot on whether to cancel the orbits or proceed.

## 2019-11-10 NOTE — Telephone Encounter (Signed)
Patient would like an update from RN states she has already contacted her insurance and are pending until they hear back from RN/MD notes.

## 2019-11-10 NOTE — Telephone Encounter (Signed)
I called and made the patient aware of apt date and time and also of the status of MRI Orbits. I told her we would be in contact once we have a decision from Dr. Epimenio Foot or her insurance.

## 2019-11-10 NOTE — Telephone Encounter (Signed)
I called health help to check the status. The MRI Brain has been approved but the MRI orbits has not been it is pending MD review. The nurse was not able to approve it on her level because they consider it the same body part. Do you want to with draw the MRI Orbits and her proceed on the MRI Brain? Or wait and see how the MD review goes?

## 2019-11-10 NOTE — Telephone Encounter (Signed)
Is there something we need to do with the MRI orbits? Or is that pending with her insurance?

## 2019-11-10 NOTE — Telephone Encounter (Signed)
Patient called back with [employer] on the line, they stated that it was approved but it is still pending to be finalized. The patient asked if we could go ahead and schedule her. I informed her that a lot of facilities will not schedule until there is an [place] on file but I told her I would ask [facility] if they would put her on the schedule before the auth was finalized by the insurance. I reached out to [facility] with [facility], she has an apt on Thursday the 18th at 1:30.

## 2019-11-10 NOTE — Telephone Encounter (Signed)
Orders are awaiting Dr. Bonnita Hollow signature. DWD

## 2019-11-10 NOTE — Telephone Encounter (Signed)
Maureen Burton, do you have an update on status of MRI?

## 2019-11-10 NOTE — Telephone Encounter (Signed)
No it is pending with the insurance.

## 2019-11-11 NOTE — Telephone Encounter (Signed)
Noted, thank you. DWD  

## 2019-11-11 NOTE — Telephone Encounter (Signed)
[employer] auth for both MRI's : [id number] (exp. 11/03/19 to 12/03/19) patient is scheduled at [facility] for 11/13/19.

## 2019-11-18 DIAGNOSIS — H30033 Focal chorioretinal inflammation, peripheral, bilateral: Secondary | ICD-10-CM | POA: Insufficient documentation

## 2019-11-18 DIAGNOSIS — H3581 Retinal edema: Secondary | ICD-10-CM | POA: Insufficient documentation

## 2019-11-20 ENCOUNTER — Other Ambulatory Visit: Payer: Self-pay | Admitting: Neurology

## 2019-11-20 ENCOUNTER — Telehealth: Payer: Self-pay | Admitting: Neurology

## 2019-11-20 DIAGNOSIS — R220 Localized swelling, mass and lump, head: Secondary | ICD-10-CM

## 2019-11-20 DIAGNOSIS — R519 Headache, unspecified: Secondary | ICD-10-CM

## 2019-11-20 DIAGNOSIS — R937 Abnormal findings on diagnostic imaging of other parts of musculoskeletal system: Secondary | ICD-10-CM

## 2019-11-20 NOTE — Telephone Encounter (Signed)
Pt called wanting to speak to RN about the MRI results that she received. Please advise.

## 2019-11-20 NOTE — Progress Notes (Signed)
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  This cannot be appreciated on the 2019 MRI so appears to be new.  We will try to get the actual images so I can compare side-by-side and also check a CT scan of the head to characterize the bone.  She reports more pain and swelling in the left face since I saw her a few weeks ago.    From report 1. T1-hypointense and T2-hyperintense lesion with relatively well demarcated borders in the superolateral left greater sphenoid wing. It measures approximately 20 mm AP x 9 mm transverse x 17 mm craniocaudal. It is hypointense on the axial fat-suppressed T2-FLAIR sequence (series 13), suggesting that it may be at least partially fluid-filled. Following IV contrast administration, it shows partial enhancement, predominantly peripheral. This lesion has nonaggressive features, but is of indeterminate etiology and clinical significance. It could represent an area of fibrous dysplasia or a nonspecific intraosseous cyst, but is strictly indeterminate. Head CT without IV contrast could be obtained for further evaluation, and for follow-up to assess stability.

## 2019-11-20 NOTE — Telephone Encounter (Signed)
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  This cannot be appreciated on the 2019 MRI so appears to be new.  We will try to get the actual images so I can compare side-by-side and also check a CT scan of the head to characterize the bone.  She reports more pain and swelling in the left face since I saw her a few weeks ago.    From report 1. T1-hypointense and T2-hyperintense lesion with relatively well demarcated borders in the superolateral left greater sphenoid wing. It measures approximately 20 mm AP x 9 mm transverse x 17 mm craniocaudal. It is hypointense on the axial fat-suppressed T2-FLAIR sequence (series 13), suggesting that it may be at least partially fluid-filled. Following IV contrast administration, it shows partial enhancement, predominantly peripheral. This lesion has nonaggressive features, but is of indeterminate etiology and clinical significance. It could represent an area of fibrous dysplasia or a nonspecific intraosseous cyst, but is strictly indeterminate. Head CT without IV contrast could be obtained for further evaluation, and for follow-up to assess stability. 

## 2019-11-20 NOTE — Telephone Encounter (Signed)
Dr. Epimenio Foot- do you have MRI results on her?

## 2019-11-26 DIAGNOSIS — M899 Disorder of bone, unspecified: Secondary | ICD-10-CM | POA: Diagnosis not present

## 2019-11-26 DIAGNOSIS — R519 Headache, unspecified: Secondary | ICD-10-CM | POA: Diagnosis not present

## 2019-11-26 NOTE — Telephone Encounter (Signed)
Patient called to check on the status of CT informed PA is still being completed but once received she would be sent to [facility] where she got her MRI done . Patient then began stating she has eye pressure and left face swelling and Informed message would be sent to RN

## 2019-11-26 NOTE — Telephone Encounter (Signed)
Patient is scheduled at [facility]. For today 11/26/19 at 4 pm.

## 2019-11-26 NOTE — Telephone Encounter (Signed)
Maureen Elliott: [id number] (exp. 11/26/19 to 12/26/19) order faxed to [facility].they will reach out to the patient to schedule.

## 2019-11-27 ENCOUNTER — Telehealth: Payer: Self-pay | Admitting: Neurology

## 2019-11-27 DIAGNOSIS — M8560 Other cyst of bone, unspecified site: Secondary | ICD-10-CM

## 2019-11-27 DIAGNOSIS — M899 Disorder of bone, unspecified: Secondary | ICD-10-CM

## 2019-11-27 NOTE — Telephone Encounter (Signed)
I got the results of the CT scan performed at [facility].  It shows a well-defined lytic lesion in the inferior anterior left frontal bone above the lateral wall of the left orbit.  Radiology had compared it to the MRI images.  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  It was measured at 2.0 x 1.7 x 0.8 cm  I measured the bone cyst on the 02/11/2018 CT scan.  By my measurement it is 2.0  x1.6 x 0.8 cm  Thus, it appears to be stable.  She has reported left facial pain and swelling.  It is uncertain if this is related to her symptoms but no further action would be needed at this time.  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

## 2019-12-01 NOTE — Telephone Encounter (Signed)
Pt called in regards to her results states she would like a cb to discuss future plans in regards to results

## 2019-12-01 NOTE — Telephone Encounter (Signed)
I discussed the CT scan.  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  Although radiologic measurements had shown slight increase between 2019 and 2021, I measured the lesion as being unchanged.  She continues to experience a swelling sensation in the left face.  She would like to get a second opinion and we will send her to the [facility]

## 2019-12-02 ENCOUNTER — Telehealth: Payer: Self-pay | Admitting: Neurology

## 2019-12-02 NOTE — Telephone Encounter (Signed)
Pt called stating she has all the MRI disks. She gave no other information.

## 2019-12-02 NOTE — Telephone Encounter (Signed)
Maureen Elliott, can you call her back to see what she is needing re MRI discs?

## 2019-12-03 NOTE — Telephone Encounter (Signed)
I called pt, she just wanted Dr Epimenio Foot to know that she have all of her cds for her up coming appts with [facility] . Just FYI.

## 2019-12-19 ENCOUNTER — Other Ambulatory Visit: Payer: Self-pay | Admitting: Internal Medicine

## 2019-12-19 DIAGNOSIS — M8560 Other cyst of bone, unspecified site: Secondary | ICD-10-CM | POA: Diagnosis not present

## 2019-12-19 DIAGNOSIS — M899 Disorder of bone, unspecified: Secondary | ICD-10-CM

## 2019-12-23 ENCOUNTER — Ambulatory Visit
Admission: RE | Admit: 2019-12-23 | Discharge: 2019-12-23 | Disposition: A | Discharge status: Home / Self Care | Payer: Medicare PPO | Source: Ambulatory Visit | Attending: Internal Medicine | Admitting: Internal Medicine

## 2019-12-23 ENCOUNTER — Other Ambulatory Visit: Payer: Self-pay | Admitting: Internal Medicine

## 2019-12-23 DIAGNOSIS — M899 Disorder of bone, unspecified: Secondary | ICD-10-CM

## 2019-12-23 DIAGNOSIS — R220 Localized swelling, mass and lump, head: Secondary | ICD-10-CM | POA: Diagnosis not present

## 2019-12-23 DIAGNOSIS — H5462 Unqualified visual loss, left eye, normal vision right eye: Secondary | ICD-10-CM | POA: Insufficient documentation

## 2019-12-23 DIAGNOSIS — R413 Other amnesia: Secondary | ICD-10-CM | POA: Insufficient documentation

## 2019-12-23 DIAGNOSIS — H40052 Ocular hypertension, left eye: Secondary | ICD-10-CM | POA: Insufficient documentation

## 2019-12-23 DIAGNOSIS — R112 Nausea with vomiting, unspecified: Secondary | ICD-10-CM | POA: Insufficient documentation

## 2019-12-23 DIAGNOSIS — R29898 Other symptoms and signs involving the musculoskeletal system: Secondary | ICD-10-CM | POA: Insufficient documentation

## 2019-12-24 ENCOUNTER — Other Ambulatory Visit: Payer: Self-pay | Admitting: Student

## 2020-01-10 DIAGNOSIS — M899 Disorder of bone, unspecified: Secondary | ICD-10-CM | POA: Diagnosis not present

## 2020-01-10 DIAGNOSIS — M898X8 Other specified disorders of bone, other site: Secondary | ICD-10-CM | POA: Diagnosis not present

## 2020-02-03 DIAGNOSIS — J310 Chronic rhinitis: Secondary | ICD-10-CM | POA: Diagnosis not present

## 2020-02-03 DIAGNOSIS — J342 Deviated nasal septum: Secondary | ICD-10-CM | POA: Diagnosis not present

## 2020-02-03 DIAGNOSIS — R519 Headache, unspecified: Secondary | ICD-10-CM | POA: Diagnosis not present

## 2020-02-05 ENCOUNTER — Other Ambulatory Visit: Payer: Self-pay | Admitting: Otolaryngology

## 2020-02-18 DIAGNOSIS — H401113 Primary open-angle glaucoma, right eye, severe stage: Secondary | ICD-10-CM | POA: Diagnosis not present

## 2020-02-18 DIAGNOSIS — H401121 Primary open-angle glaucoma, left eye, mild stage: Secondary | ICD-10-CM | POA: Diagnosis not present

## 2020-02-24 ENCOUNTER — Encounter (HOSPITAL_BASED_OUTPATIENT_CLINIC_OR_DEPARTMENT_OTHER): Payer: Self-pay | Admitting: Otolaryngology

## 2020-02-24 ENCOUNTER — Other Ambulatory Visit: Payer: Self-pay

## 2020-02-25 ENCOUNTER — Other Ambulatory Visit: Payer: Self-pay | Admitting: Family Medicine

## 2020-02-25 DIAGNOSIS — Z1231 Encounter for screening mammogram for malignant neoplasm of breast: Secondary | ICD-10-CM

## 2020-02-26 ENCOUNTER — Other Ambulatory Visit (HOSPITAL_COMMUNITY)
Admission: RE | Admit: 2020-02-26 | Discharge: 2020-02-26 | Disposition: A | Discharge status: Home / Self Care | Payer: Medicare PPO | Source: Ambulatory Visit | Attending: Otolaryngology | Admitting: Otolaryngology

## 2020-02-26 DIAGNOSIS — Z20822 Contact with and (suspected) exposure to covid-19: Secondary | ICD-10-CM | POA: Diagnosis not present

## 2020-02-26 DIAGNOSIS — Z01812 Encounter for preprocedural laboratory examination: Secondary | ICD-10-CM | POA: Diagnosis not present

## 2020-02-26 LAB — SARS CORONAVIRUS 2 (TAT 6-24 HRS): SARS Coronavirus 2: NEGATIVE

## 2020-03-01 ENCOUNTER — Ambulatory Visit (HOSPITAL_BASED_OUTPATIENT_CLINIC_OR_DEPARTMENT_OTHER): Payer: Medicare PPO | Admitting: Certified Registered Nurse Anesthetist

## 2020-03-01 ENCOUNTER — Other Ambulatory Visit: Payer: Self-pay

## 2020-03-01 ENCOUNTER — Encounter (HOSPITAL_BASED_OUTPATIENT_CLINIC_OR_DEPARTMENT_OTHER)
Admission: RE | Disposition: A | Discharge status: Home / Self Care | Payer: Self-pay | Source: Home / Self Care | Attending: Otolaryngology

## 2020-03-01 ENCOUNTER — Encounter (HOSPITAL_BASED_OUTPATIENT_CLINIC_OR_DEPARTMENT_OTHER): Payer: Self-pay | Admitting: Otolaryngology

## 2020-03-01 ENCOUNTER — Ambulatory Visit (HOSPITAL_BASED_OUTPATIENT_CLINIC_OR_DEPARTMENT_OTHER)
Admission: RE | Admit: 2020-03-01 | Discharge: 2020-03-01 | Disposition: A | Discharge status: Home / Self Care | Payer: Medicare PPO | Attending: Otolaryngology | Admitting: Otolaryngology

## 2020-03-01 DIAGNOSIS — J3489 Other specified disorders of nose and nasal sinuses: Secondary | ICD-10-CM | POA: Diagnosis not present

## 2020-03-01 DIAGNOSIS — R519 Headache, unspecified: Secondary | ICD-10-CM | POA: Insufficient documentation

## 2020-03-01 DIAGNOSIS — J342 Deviated nasal septum: Secondary | ICD-10-CM | POA: Diagnosis not present

## 2020-03-01 HISTORY — PX: SEPTOPLASTY: SHX2393

## 2020-03-01 HISTORY — DX: Acute embolism and thrombosis of unspecified deep veins of unspecified lower extremity: I82.409

## 2020-03-01 SURGERY — SEPTOPLASTY, NOSE
Anesthesia: General | Site: Nose

## 2020-03-01 MED ORDER — LIDOCAINE 2% (20 MG/ML) 5 ML SYRINGE
INTRAMUSCULAR | Status: DC | PRN
  Administered 2020-03-01: 80 mg via INTRAVENOUS

## 2020-03-01 MED ORDER — FENTANYL CITRATE (PF) 100 MCG/2ML IJ SOLN
INTRAMUSCULAR | Status: DC | PRN
  Administered 2020-03-01: 25 ug via INTRAVENOUS
  Administered 2020-03-01: 50 ug via INTRAVENOUS
  Administered 2020-03-01: 25 ug via INTRAVENOUS

## 2020-03-01 MED ORDER — PROMETHAZINE HCL 25 MG/ML IJ SOLN
INTRAMUSCULAR | Status: AC
  Filled 2020-03-01: qty 1

## 2020-03-01 MED ORDER — ONDANSETRON HCL 4 MG/2ML IJ SOLN
INTRAMUSCULAR | Status: DC | PRN
  Administered 2020-03-01: 4 mg via INTRAVENOUS

## 2020-03-01 MED ORDER — LABETALOL HCL 5 MG/ML IV SOLN
INTRAVENOUS | Status: DC | PRN
Start: 2020-03-01 — End: 2020-03-01
  Administered 2020-03-01: 10 mg via INTRAVENOUS

## 2020-03-01 MED ORDER — DROPERIDOL 2.5 MG/ML IJ SOLN
INTRAMUSCULAR | Status: DC | PRN
  Administered 2020-03-01: .625 mg via INTRAVENOUS

## 2020-03-01 MED ORDER — OXYCODONE HCL 5 MG/5ML PO SOLN: 5.0000 mg | Freq: Once | ORAL | Status: DC | PRN

## 2020-03-01 MED ORDER — LIDOCAINE 2% (20 MG/ML) 5 ML SYRINGE
INTRAMUSCULAR | Status: AC
  Filled 2020-03-01: qty 5

## 2020-03-01 MED ORDER — CLINDAMYCIN PHOSPHATE 900 MG/50ML IV SOLN
INTRAVENOUS | Status: AC
  Filled 2020-03-01: qty 50

## 2020-03-01 MED ORDER — DROPERIDOL 2.5 MG/ML IJ SOLN
INTRAMUSCULAR | Status: AC
  Filled 2020-03-01: qty 2

## 2020-03-01 MED ORDER — DEXAMETHASONE SODIUM PHOSPHATE 10 MG/ML IJ SOLN
INTRAMUSCULAR | Status: AC
  Filled 2020-03-01: qty 1

## 2020-03-01 MED ORDER — OXYCODONE HCL 5 MG PO TABS: 5.0000 mg | ORAL_TABLET | Freq: Once | ORAL | Status: DC | PRN

## 2020-03-01 MED ORDER — ROCURONIUM BROMIDE 10 MG/ML (PF) SYRINGE
PREFILLED_SYRINGE | INTRAVENOUS | Status: DC | PRN
  Administered 2020-03-01: 80 mg via INTRAVENOUS

## 2020-03-01 MED ORDER — LIDOCAINE-EPINEPHRINE 1 %-1:100000 IJ SOLN
INTRAMUSCULAR | Status: DC | PRN
  Administered 2020-03-01: 2.5 mL

## 2020-03-01 MED ORDER — CLINDAMYCIN PHOSPHATE 900 MG/50ML IV SOLN
INTRAVENOUS | Status: DC | PRN
  Administered 2020-03-01: 900 mg via INTRAVENOUS

## 2020-03-01 MED ORDER — HYDROMORPHONE HCL 1 MG/ML IJ SOLN
INTRAMUSCULAR | Status: AC
  Filled 2020-03-01: qty 0.5

## 2020-03-01 MED ORDER — ONDANSETRON HCL 4 MG/2ML IJ SOLN
INTRAMUSCULAR | Status: AC
  Filled 2020-03-01: qty 2

## 2020-03-01 MED ORDER — HYDROMORPHONE HCL 1 MG/ML IJ SOLN
0.2500 mg | INTRAMUSCULAR | Status: DC | PRN
  Administered 2020-03-01: 0.5 mg via INTRAVENOUS

## 2020-03-01 MED ORDER — ROCURONIUM BROMIDE 10 MG/ML (PF) SYRINGE
PREFILLED_SYRINGE | INTRAVENOUS | Status: AC
  Filled 2020-03-01: qty 10

## 2020-03-01 MED ORDER — OXYCODONE-ACETAMINOPHEN 5-325 MG PO TABS: 1.0000 | ORAL_TABLET | ORAL | 0 refills | Status: AC | PRN

## 2020-03-01 MED ORDER — PROMETHAZINE HCL 25 MG/ML IJ SOLN
6.2500 mg | INTRAMUSCULAR | Status: DC | PRN
  Administered 2020-03-01: 6.25 mg via INTRAVENOUS

## 2020-03-01 MED ORDER — LACTATED RINGERS IV SOLN: INTRAVENOUS | Status: DC

## 2020-03-01 MED ORDER — PROPOFOL 10 MG/ML IV BOLUS
INTRAVENOUS | Status: DC | PRN
  Administered 2020-03-01: 120 mg via INTRAVENOUS

## 2020-03-01 MED ORDER — FENTANYL CITRATE (PF) 100 MCG/2ML IJ SOLN
INTRAMUSCULAR | Status: AC
  Filled 2020-03-01: qty 2

## 2020-03-01 MED ORDER — MUPIROCIN 2 % EX OINT
TOPICAL_OINTMENT | CUTANEOUS | Status: DC | PRN
  Administered 2020-03-01: 1 via TOPICAL

## 2020-03-01 MED ORDER — SUGAMMADEX SODIUM 200 MG/2ML IV SOLN
INTRAVENOUS | Status: DC | PRN
  Administered 2020-03-01: 200 mg via INTRAVENOUS

## 2020-03-01 MED ORDER — OXYMETAZOLINE HCL 0.05 % NA SOLN
NASAL | Status: DC | PRN
  Administered 2020-03-01: 1 via TOPICAL

## 2020-03-01 MED ORDER — PHENYLEPHRINE 40 MCG/ML (10ML) SYRINGE FOR IV PUSH (FOR BLOOD PRESSURE SUPPORT)
PREFILLED_SYRINGE | INTRAVENOUS | Status: DC | PRN
  Administered 2020-03-01: 80 ug via INTRAVENOUS

## 2020-03-01 MED ORDER — DEXAMETHASONE SODIUM PHOSPHATE 10 MG/ML IJ SOLN
INTRAMUSCULAR | Status: DC | PRN
  Administered 2020-03-01: 10 mg via INTRAVENOUS

## 2020-03-01 MED ORDER — CLINDAMYCIN HCL 300 MG PO CAPS
300.0000 mg | ORAL_CAPSULE | Freq: Three times a day (TID) | ORAL | 0 refills | Status: AC
Start: 2020-03-01 — End: 2020-03-04

## 2020-03-01 SURGICAL SUPPLY — 37 items
ATTRACTOMAT 16X20 MAGNETIC DRP (DRAPES) IMPLANT
CANISTER SUCT 1200ML W/VALVE (MISCELLANEOUS) ×4 IMPLANT
COAGULATOR SUCT 8FR VV (MISCELLANEOUS) ×4 IMPLANT
COVER WAND RF STERILE (DRAPES) IMPLANT
DECANTER SPIKE VIAL GLASS SM (MISCELLANEOUS) IMPLANT
DRSG NASOPORE 8CM (GAUZE/BANDAGES/DRESSINGS) IMPLANT
DRSG TELFA 3X8 NADH (GAUZE/BANDAGES/DRESSINGS) IMPLANT
ELECT REM PT RETURN 9FT ADLT (ELECTROSURGICAL) ×4
ELECTRODE REM PT RTRN 9FT ADLT (ELECTROSURGICAL) ×2 IMPLANT
GLOVE BIO SURGEON STRL SZ7.5 (GLOVE) ×4 IMPLANT
GLOVE BIOGEL PI IND STRL 7.0 (GLOVE) IMPLANT
GLOVE BIOGEL PI INDICATOR 7.0 (GLOVE) ×2
GLOVE SURG SS PI 7.0 STRL IVOR (GLOVE) ×4 IMPLANT
GOWN STRL REUS W/ TWL LRG LVL3 (GOWN DISPOSABLE) ×4 IMPLANT
GOWN STRL REUS W/TWL LRG LVL3 (GOWN DISPOSABLE) ×8
HEMOSTAT SNOW SURGICEL 2X4 (HEMOSTASIS) ×2 IMPLANT
NDL HYPO 25X1 1.5 SAFETY (NEEDLE) ×2 IMPLANT
NEEDLE HYPO 25X1 1.5 SAFETY (NEEDLE) ×4 IMPLANT
NS IRRIG 1000ML POUR BTL (IV SOLUTION) ×4 IMPLANT
PACK ENT DAY SURGERY (CUSTOM PROCEDURE TRAY) ×4 IMPLANT
PAD DRESSING TELFA 3X8 NADH (GAUZE/BANDAGES/DRESSINGS) IMPLANT
SET BASIN DAY SURGERY F.S. (CUSTOM PROCEDURE TRAY) ×4 IMPLANT
SLEEVE SCD COMPRESS KNEE MED (MISCELLANEOUS) ×2 IMPLANT
SOLUTION BUTLER CLEAR DIP (MISCELLANEOUS) ×2 IMPLANT
SPLINT NASAL AIRWAY SILICONE (MISCELLANEOUS) ×4 IMPLANT
SPONGE GAUZE 2X2 8PLY STER LF (GAUZE/BANDAGES/DRESSINGS) ×1
SPONGE GAUZE 2X2 8PLY STRL LF (GAUZE/BANDAGES/DRESSINGS) ×3 IMPLANT
SPONGE NEURO XRAY DETECT 1X3 (DISPOSABLE) ×4 IMPLANT
SUT CHROMIC 4 0 P 3 18 (SUTURE) ×4 IMPLANT
SUT PLAIN 4 0 ~~LOC~~ 1 (SUTURE) ×4 IMPLANT
SUT PROLENE 3 0 PS 2 (SUTURE) ×4 IMPLANT
SUT VIC AB 4-0 P-3 18XBRD (SUTURE) IMPLANT
SUT VIC AB 4-0 P3 18 (SUTURE)
TOWEL GREEN STERILE FF (TOWEL DISPOSABLE) ×4 IMPLANT
TUBE SALEM SUMP 12R W/ARV (TUBING) IMPLANT
TUBE SALEM SUMP 16 FR W/ARV (TUBING) ×4 IMPLANT
YANKAUER SUCT BULB TIP NO VENT (SUCTIONS) ×4 IMPLANT

## 2020-03-01 NOTE — Discharge Instructions (Addendum)
POSTOPERATIVE INSTRUCTIONS FOR PATIENTS HAVING NASAL OR SINUS OPERATIONS ACTIVITY: Restrict activity at home for the first two days, resting as much as possible. Light activity is best. You may usually return to work within a week. You should refrain from nose blowing, strenuous activity, or heavy lifting greater than 20lbs for a total of one week after your operation.  If sneezing cannot be avoided, sneeze with your mouth open. DISCOMFORT: You may experience a dull headache and pressure along with nasal congestion and discharge. These symptoms may be worse during the first week after the operation but may last as long as two to four weeks.  Please take Tylenol or the pain medication that has been prescribed for you. Do not take aspirin or aspirin containing medications since they may cause bleeding.  You may experience symptoms of post nasal drainage, nasal congestion, headaches and fatigue for two or three months after your operation.  BLEEDING: You may have some blood tinged nasal drainage for approximately two weeks after the operation.  The discharge will be worse for the first week.  Please call our office at [phone] or go to the nearest hospital emergency room if you experience any of the following: heavy, bright red blood from your nose or mouth that lasts longer than 15 minutes or coughing up or vomiting bright red blood or blood clots. GENERAL CONSIDERATIONS: 1. A gauze dressing will be placed on your upper lip to absorb any drainage after the operation. You may need to change this several times a day.  If you do not have very much drainage, you may remove the dressing.  Remember that you may gently wipe your nose with a tissue and sniff in, but DO NOT blow your nose. 2. Please keep all of your postoperative appointments.  Your final results after the operation will depend on proper follow-up.  The initial visit is usually 2 to 5 days after the operation.  During this visit, the remaining nasal  packing and internal septal splints will be removed.  Your nasal and sinus cavities will be cleaned.  During the second visit, your nasal and sinus cavities will be cleaned again. Have someone drive you to your first two postoperative appointments.  3. How you care for your nose after the operation will influence the results that you obtain.  You should follow all directions, take your medication as prescribed, and call our office [phone] with any problems or questions. 4. You may be more comfortable sleeping with your head elevated on two pillows. 5. Do not take any medications that we have not prescribed or recommended. WARNING SIGNS: if any of the following should occur, please call our office: 1. Persistent fever greater than 102F. 2. Persistent vomiting. 3. Severe and constant pain that is not relieved by prescribed pain medication. 4. Trauma to the nose. Rash or unusual side effects from any medicines.   Post Anesthesia Home Care Instructions  Activity: Get plenty of rest for the remainder of the day. A responsible individual must stay with you for 24 hours following the procedure.  For the next 24 hours, DO NOT: -Drive a car -Operate machinery -Drink alcoholic beverages -Take any medication unless instructed by your physician -Make any legal decisions or sign important papers.  Meals: Start with liquid foods such as gelatin or soup. Progress to regular foods as tolerated. Avoid greasy, spicy, heavy foods. If nausea and/or vomiting occur, drink only clear liquids until the nausea and/or vomiting subsides. Call your physician if vomiting continues.    Special Instructions/Symptoms: Your throat may feel dry or sore from the anesthesia or the breathing tube placed in your throat during surgery. If this causes discomfort, gargle with warm salt water. The discomfort should disappear within 24 hours.  If you had a scopolamine patch placed behind your ear for the management of post-  operative nausea and/or vomiting:  1. The medication in the patch is effective for 72 hours, after which it should be removed.  Wrap patch in a tissue and discard in the trash. Wash hands thoroughly with soap and water. 2. You may remove the patch earlier than 72 hours if you experience unpleasant side effects which may include dry mouth, dizziness or visual disturbances. 3. Avoid touching the patch. Wash your hands with soap and water after contact with the patch.     

## 2020-03-01 NOTE — H&P (Signed)
Cc: Left facial pain  HPI: The patient is a 74 y/o female who presents today with new complaint of left facial pain for the past year. The patient has been experiencing almost daily left facial pain. She has been seen by multiple specialist with no definitive diagnosis. The patient recently underwent a head CT which showed a severe left septal deviation and spur. The patient has intermittent issues with nasal congestion. No purulent sinus drainage has been noted. No other ENT, GI, or respiratory issue noted since the last visit.   Exam: General: Communicates without difficulty, well nourished, no acute distress. Head: Normocephalic, no evidence injury, no tenderness, facial buttresses intact without stepoff. Eyes: PERRL, EOMI. No scleral icterus, conjunctivae clear. Neuro: CN II exam reveals vision grossly intact.  No nystagmus at any point of gaze. Ears: Auricles well formed without lesions.  Ear canals are intact without mass or lesion.  No erythema or edema is appreciated.  The TMs are intact without fluid. Nose: External evaluation reveals normal support and skin without lesions.  Dorsum is intact.  Anterior rhinoscopy reveals mildly congested and edematous mucosa over anterior aspect of the inferior turbinates and nasal septum.  No purulence is noted. Middle meatus is not well visualized. Oral:  Oral cavity and oropharynx are intact, symmetric, without erythema or edema.  Mucosa is moist without lesions. Neck: Full range of motion without pain.  There is no significant lymphadenopathy.  No masses palpable.  Thyroid bed within normal limits to palpation.  Parotid glands and submandibular glands equal bilaterally without mass.  Trachea is midline. Neuro:  CN 2-12 grossly intact. Gait normal. Vestibular: No nystagmus at any point of gaze.   Procedure: Flexible Nasal Endoscopy Description: Risks, benefits, and alternatives of flexible endoscopy were explained to the patient. Specific mention was made of  the risk of throat numbness with difficulty swallowing, possible bleeding from the nose and mouth, and pain from the procedure. The patient gave oral consent to proceed.  The flexible scope was inserted into the right nasal cavity. Endoscopy of the interior nasal cavity, superior, inferior, and middle meatus was performed. The sphenoid-ethmoid recess was examined. Edematous mucosa was noted. No polyp, mass, or lesion was appreciated. Olfactory cleft was clear. Nasopharynx was clear. Turbinates were mildly hypertrophied but without mass. Incomplete response to decongestion. The procedure was repeated on the contralateral side with severe NSD with septal spur impinging on the lateral wall. The patient tolerated the procedure well.   Assessment 1. Severe left septal deviation is noted with septal spur impinging on the lateral wall, likely resulting in a contact point headache. No purulent drainage, polyps, or other suspicious mass or lesion is noted on today's nasal endoscopy.  Plan  1. The physical exam, CT, and nasal endoscopy findings are extensively reviewed with the patient.  2. The patient would likely benefit from septoplasty. The risks, benefits, alternatives, and details of the procedure are reviewed with the patient. Questions are invited and answered. 3. The patient is interested in proceeding with the procedure.  We will schedule the procedure in accordance with the family schedule.

## 2020-03-01 NOTE — Anesthesia Postprocedure Evaluation (Signed)
Anesthesia Post Note  Patient: Maureen Elliott  Procedure(s) Performed: NASAL SEPTOPLASTY (Bilateral Nose)     Patient location during evaluation: PACU Anesthesia Type: General Level of consciousness: awake and alert Pain management: pain level controlled Vital Signs Assessment: post-procedure vital signs reviewed and stable Respiratory status: spontaneous breathing, nonlabored ventilation and respiratory function stable Cardiovascular status: blood pressure returned to baseline and stable Postop Assessment: no apparent nausea or vomiting Anesthetic complications: no    Last Vitals:  Vitals:   03/01/20 1130 03/01/20 1200  BP:  (!) 159/74  Pulse: (!) 54 65  Resp: 14 16  Temp:  37.1 C  SpO2: 92% 96%    Last Pain:  Vitals:   03/01/20 1215  TempSrc:   PainSc: 2                  Lowella Curb

## 2020-03-01 NOTE — Anesthesia Procedure Notes (Signed)
Procedure Name: Intubation Date/Time: 03/01/2020 9:25 AM Performed by: Genelle Bal, CRNA Pre-anesthesia Checklist: Patient identified, Emergency Drugs available, Suction available and Patient being monitored Patient Re-evaluated:Patient Re-evaluated prior to induction Oxygen Delivery Method: Circle system utilized Preoxygenation: Pre-oxygenation with 100% oxygen Induction Type: IV induction Ventilation: Mask ventilation without difficulty Laryngoscope Size: Mac and 3 Grade View: Grade II Tube type: Oral Tube size: 7.0 mm Number of attempts: 1 Airway Equipment and Method: Stylet and Oral airway Placement Confirmation: ETT inserted through vocal cords under direct vision,  positive ETCO2 and breath sounds checked- equal and bilateral Secured at: 20 cm Tube secured with: Tape Dental Injury: Teeth and Oropharynx as per pre-operative assessment  Difficulty Due To: Difficulty was anticipated, Difficult Airway- due to reduced neck mobility and Difficult Airway- due to anterior larynx Comments: DL x1 Mil 2 by Trellis Moment CRNA, grade III view, DL x1 Mac 3 by Royetta Car MDA, grade II view with aggressive cricoid pressure applied, ATOI 7.0 OETT, +BBS/etCo2

## 2020-03-01 NOTE — Anesthesia Preprocedure Evaluation (Signed)
Anesthesia Evaluation  Patient identified by MRN, date of birth, ID band Patient awake    Reviewed: Allergy & Precautions, H&P , NPO status , Patient's Chart, lab work & pertinent test results  History of Anesthesia Complications (+) PONV  Airway Mallampati: II  TM Distance: >3 FB Neck ROM: Full    Dental no notable dental hx.    Pulmonary neg pulmonary ROS,    Pulmonary exam normal breath sounds clear to auscultation       Cardiovascular negative cardio ROS Normal cardiovascular exam Rhythm:Regular Rate:Normal     Neuro/Psych negative neurological ROS  negative psych ROS   GI/Hepatic negative GI ROS, Neg liver ROS,   Endo/Other  negative endocrine ROS  Renal/GU negative Renal ROS  negative genitourinary   Musculoskeletal negative musculoskeletal ROS (+)   Abdominal (+) + obese,   Peds negative pediatric ROS (+)  Hematology negative hematology ROS (+)   Anesthesia Other Findings H/o cervical surgery  Reproductive/Obstetrics negative OB ROS                             Anesthesia Physical  Anesthesia Plan  ASA: II  Anesthesia Plan: General   Post-op Pain Management:    Induction: Intravenous  PONV Risk Score and Plan: 4 or greater and Ondansetron, Dexamethasone, Midazolam and Droperidol  Airway Management Planned: Oral ETT  Additional Equipment:   Intra-op Plan:   Post-operative Plan: Extubation in OR  Informed Consent: I have reviewed the patients History and Physical, chart, labs and discussed the procedure including the risks, benefits and alternatives for the proposed anesthesia with the patient or authorized representative who has indicated his/her understanding and acceptance.     Dental advisory given  Plan Discussed with: CRNA  Anesthesia Plan Comments:         Anesthesia Quick Evaluation

## 2020-03-01 NOTE — Op Note (Signed)
DATE OF PROCEDURE: 03/01/2020  OPERATIVE REPORT   SURGEON: Newman Pies, MD   PREOPERATIVE DIAGNOSES:  1. Severe nasal septal deviation.  2. Facial pain and contact point headache.  POSTOPERATIVE DIAGNOSES:  1. Severe nasal septal deviation.  2. Facial pain and contact point headache.  PROCEDURE PERFORMED:  1. Septoplasty.   ANESTHESIA: General endotracheal tube anesthesia.   COMPLICATIONS: None.   ESTIMATED BLOOD LOSS: 50 mL.   INDICATION FOR PROCEDURE: Maureen Elliott is a 74 y.o. female with a history of chronic left facial pain for the past year. The patient has been experiencing almost daily left facial pain. She has been seen by multiple specialists with no definitive diagnosis. The patient recently underwent a head CT which showed a severe left septal deviation and spur, impinging on the left nasal wall. This might be the cause of her left-sided headache. Based on the above findings, the decision was made for the patient to undergo the above-stated procedure. The risks, benefits, alternatives, and details of the procedures were discussed with the patient. Questions were invited and answered. Informed consent was obtained.   DESCRIPTION OF PROCEDURE: The patient was taken to the operating room and placed supine on the operating table. General endotracheal tube anesthesia was administered by the anesthesiologist. The patient was positioned, and prepped and draped in the standard fashion for nasal surgery. Pledgets soaked with Afrin were placed in both nasal cavities for decongestion. The pledgets were subsequently removed.   Examination of the nasal cavity revealed a severe nasal septal deviation. 1% lidocaine with 1:100,000 epinephrine was injected onto the nasal septum bilaterally. A hemitransfixion incision was made on the left side. The mucosal flap was carefully elevated on the left side. A cartilaginous incision was made 1 cm superior to the caudal margin of the nasal septum. Mucosal  flap was also elevated on the right side in the similar fashion. It should be noted that due to the severe septal deviation, the deviated portion of the cartilaginous and bony septum had to be removed in piecemeal fashion. Once the deviated portions were removed, a straight midline septum was achieved. The septum was then quilted with 4-0 plain gut sutures. The hemitransfixion incision was closed with interrupted 4-0 chromic sutures. Doyle splints were applied to the nasal septum.  The care of the patient was turned over to the anesthesiologist. The patient was awakened from anesthesia without difficulty. The patient was extubated and transferred to the recovery room in good condition.   OPERATIVE FINDINGS: Severe nasal septal deviation and left septal spur.  SPECIMEN: None.   FOLLOWUP CARE: The patient be discharged home once she is awake and alert. The patient will be placed on Percocet p.r.n. pain, and clindamycin for 3 days. The patient will follow up in my office in 3 days for splint removal.   Tanai Bouler Philomena Doheny, MD

## 2020-03-01 NOTE — Transfer of Care (Signed)
Immediate Anesthesia Transfer of Care Note  Patient: Maureen Elliott  Procedure(s) Performed: NASAL SEPTOPLASTY (Bilateral Nose)  Patient Location: PACU  Anesthesia Type:General  Level of Consciousness: awake and alert , oriented  Airway & Oxygen Therapy: Patient Spontanous Breathing and Patient connected to nasal cannula oxygen  Post-op Assessment: Report given to RN and Post -op Vital signs reviewed and stable  Post vital signs: Reviewed and stable  Last Vitals:  Vitals Value Taken Time  BP 155/72 03/01/20 1033  Temp    Pulse 61 03/01/20 1038  Resp 14 03/01/20 1038  SpO2 100 % 03/01/20 1038  Vitals shown include unvalidated device data.  Last Pain:  Vitals:   03/01/20 0744  TempSrc: Oral  PainSc: 0-No pain         Complications: No apparent anesthesia complications

## 2020-03-02 ENCOUNTER — Encounter: Payer: Self-pay | Admitting: *Deleted

## 2020-03-23 DIAGNOSIS — H401113 Primary open-angle glaucoma, right eye, severe stage: Secondary | ICD-10-CM | POA: Diagnosis not present

## 2020-03-23 DIAGNOSIS — H401121 Primary open-angle glaucoma, left eye, mild stage: Secondary | ICD-10-CM | POA: Diagnosis not present

## 2020-04-10 DIAGNOSIS — H401113 Primary open-angle glaucoma, right eye, severe stage: Secondary | ICD-10-CM | POA: Diagnosis not present

## 2020-04-10 DIAGNOSIS — H401121 Primary open-angle glaucoma, left eye, mild stage: Secondary | ICD-10-CM | POA: Diagnosis not present

## 2020-04-13 ENCOUNTER — Ambulatory Visit
Admission: RE | Admit: 2020-04-13 | Discharge: 2020-04-13 | Disposition: A | Discharge status: Home / Self Care | Payer: Medicare PPO | Source: Ambulatory Visit | Attending: Family Medicine | Admitting: Family Medicine

## 2020-04-13 ENCOUNTER — Other Ambulatory Visit: Payer: Self-pay

## 2020-04-13 DIAGNOSIS — Z1231 Encounter for screening mammogram for malignant neoplasm of breast: Secondary | ICD-10-CM | POA: Diagnosis not present

## 2020-04-14 ENCOUNTER — Other Ambulatory Visit: Payer: Self-pay

## 2020-04-14 ENCOUNTER — Encounter (HOSPITAL_BASED_OUTPATIENT_CLINIC_OR_DEPARTMENT_OTHER): Payer: Self-pay | Admitting: Emergency Medicine

## 2020-04-14 ENCOUNTER — Emergency Department (HOSPITAL_BASED_OUTPATIENT_CLINIC_OR_DEPARTMENT_OTHER): Payer: Medicare PPO

## 2020-04-14 ENCOUNTER — Emergency Department (HOSPITAL_BASED_OUTPATIENT_CLINIC_OR_DEPARTMENT_OTHER)
Admission: EM | Admit: 2020-04-14 | Discharge: 2020-04-14 | Disposition: A | Discharge status: Home / Self Care | Payer: Medicare PPO | Attending: Emergency Medicine | Admitting: Emergency Medicine

## 2020-04-14 DIAGNOSIS — K5792 Diverticulitis of intestine, part unspecified, without perforation or abscess without bleeding: Secondary | ICD-10-CM

## 2020-04-14 DIAGNOSIS — R1032 Left lower quadrant pain: Secondary | ICD-10-CM | POA: Insufficient documentation

## 2020-04-14 DIAGNOSIS — K838 Other specified diseases of biliary tract: Secondary | ICD-10-CM | POA: Diagnosis not present

## 2020-04-14 DIAGNOSIS — K5732 Diverticulitis of large intestine without perforation or abscess without bleeding: Secondary | ICD-10-CM | POA: Diagnosis not present

## 2020-04-14 LAB — URINALYSIS, MICROSCOPIC (REFLEX): WBC, UA: NONE SEEN WBC/hpf (ref 0–5)

## 2020-04-14 LAB — URINALYSIS, ROUTINE W REFLEX MICROSCOPIC
Bilirubin Urine: NEGATIVE
Glucose, UA: NEGATIVE mg/dL
Ketones, ur: NEGATIVE mg/dL
Leukocytes,Ua: NEGATIVE
Nitrite: NEGATIVE
Protein, ur: NEGATIVE mg/dL
Specific Gravity, Urine: 1.015 (ref 1.005–1.030)
pH: 5.5 (ref 5.0–8.0)

## 2020-04-14 LAB — CBC
HCT: 39.4 % (ref 36.0–46.0)
Hemoglobin: 12.7 g/dL (ref 12.0–15.0)
MCH: 30 pg (ref 26.0–34.0)
MCHC: 32.2 g/dL (ref 30.0–36.0)
MCV: 92.9 fL (ref 80.0–100.0)
Platelets: 256 10*3/uL (ref 150–400)
RBC: 4.24 MIL/uL (ref 3.87–5.11)
RDW: 13.4 % (ref 11.5–15.5)
WBC: 10.9 10*3/uL — ABNORMAL HIGH (ref 4.0–10.5)
nRBC: 0 % (ref 0.0–0.2)

## 2020-04-14 LAB — COMPREHENSIVE METABOLIC PANEL
ALT: 16 U/L (ref 0–44)
AST: 18 U/L (ref 15–41)
Albumin: 3.4 g/dL — ABNORMAL LOW (ref 3.5–5.0)
Alkaline Phosphatase: 67 U/L (ref 38–126)
Anion gap: 10 (ref 5–15)
BUN: 13 mg/dL (ref 8–23)
CO2: 26 mmol/L (ref 22–32)
Calcium: 8.9 mg/dL (ref 8.9–10.3)
Chloride: 105 mmol/L (ref 98–111)
Creatinine, Ser: 0.78 mg/dL (ref 0.44–1.00)
GFR calc Af Amer: >60 mL/min (ref >60)
GFR calc non Af Amer: >60 mL/min (ref >60)
Glucose, Bld: 106 mg/dL — ABNORMAL HIGH (ref 70–99)
Potassium: 3.7 mmol/L (ref 3.5–5.1)
Sodium: 141 mmol/L (ref 135–145)
Total Bilirubin: 0.6 mg/dL (ref 0.3–1.2)
Total Protein: 6.9 g/dL (ref 6.5–8.1)

## 2020-04-14 MED ORDER — TRAMADOL HCL 50 MG PO TABS: 50.0000 mg | ORAL_TABLET | Freq: Four times a day (QID) | ORAL | 0 refills | Status: DC | PRN

## 2020-04-14 MED ORDER — METRONIDAZOLE 500 MG PO TABS
500.0000 mg | ORAL_TABLET | Freq: Three times a day (TID) | ORAL | 0 refills | Status: DC
Start: 2020-04-14 — End: 2020-04-28

## 2020-04-14 MED ORDER — PIPERACILLIN-TAZOBACTAM 3.375 G IVPB 30 MIN
3.3750 g | Freq: Once | INTRAVENOUS | Status: AC
  Administered 2020-04-14: 3.375 g via INTRAVENOUS
  Filled 2020-04-14 (×2): qty 50

## 2020-04-14 MED ORDER — ONDANSETRON HCL 4 MG/2ML IJ SOLN
4.0000 mg | Freq: Once | INTRAMUSCULAR | Status: AC
  Administered 2020-04-14: 4 mg via INTRAVENOUS
  Filled 2020-04-14: qty 2

## 2020-04-14 MED ORDER — SODIUM CHLORIDE 0.9 % IV BOLUS
1000.0000 mL | Freq: Once | INTRAVENOUS | Status: AC
  Administered 2020-04-14: 1000 mL via INTRAVENOUS

## 2020-04-14 MED ORDER — IOHEXOL 300 MG/ML  SOLN
100.0000 mL | Freq: Once | INTRAMUSCULAR | Status: AC | PRN
  Administered 2020-04-14: 100 mL via INTRAVENOUS

## 2020-04-14 MED ORDER — SULFAMETHOXAZOLE-TRIMETHOPRIM 800-160 MG PO TABS
1.0000 | ORAL_TABLET | Freq: Two times a day (BID) | ORAL | 0 refills | Status: AC
Start: 2020-04-14 — End: 2020-04-21

## 2020-04-14 MED ORDER — HYDROMORPHONE HCL 1 MG/ML IJ SOLN
0.5000 mg | Freq: Once | INTRAMUSCULAR | Status: AC
  Administered 2020-04-14: 0.5 mg via INTRAVENOUS
  Filled 2020-04-14: qty 1

## 2020-04-14 MED ORDER — ONDANSETRON 8 MG PO TBDP
8.0000 mg | ORAL_TABLET | Freq: Three times a day (TID) | ORAL | 0 refills | Status: DC | PRN
Start: 2020-04-14 — End: 2020-04-28

## 2020-04-14 MED FILL — SULFAMETHOXAZOLE-TMP DS TAB: 800-160 | 7 days supply | Qty: 14 | Fill #0

## 2020-04-14 MED FILL — traMADol HCL 50 MG TABS: 50 | 4 days supply | Qty: 15 | Fill #0

## 2020-04-14 MED FILL — ONDANSETRON ODT 8 MG TABLET: 8 | 3 days supply | Qty: 10 | Fill #0

## 2020-04-14 MED FILL — METRONIDAZOLE 500 MG TABS: 500 | 10 days supply | Qty: 30 | Fill #0

## 2020-04-14 NOTE — ED Provider Notes (Signed)
[facility] EMERGENCY DEPARTMENT Provider Note   CSN: [id number] Arrival date & time: 04/14/20  2409     History Chief Complaint  Patient presents with  . Abdominal Pain    Maureen Elliott is a 74 y.o. female.  Patient c/o left lower abd pain for the past couple days. Symptoms acute onset, milder at onset, slowly worse, dull, mod-severe, non radiating, worse w palpation. No vomiting. No abd distension. Having normal bms. No dysuria. No hx same pain. Denies hx diverticula or kidney stone. No recent trauma/fall. No fever/chills.   The history is provided by the patient and the spouse.  Abdominal Pain Associated symptoms: no chest pain, no dysuria, no fever, no shortness of breath, no sore throat and no vomiting        Past Medical History:  Diagnosis Date  . Complication of anesthesia    be careful with intubation due to previous cervical neck surgery  . Cystocele   . Diverticulosis   . DVT (deep venous thrombosis) (HCC)    left leg  . Glaucoma   . Hemorrhoids internal and external  . History of gestational diabetes yrs ago  . Nocturia   . PONV (postoperative nausea and vomiting) 2013   n/v after phenergan, injectable phenergan helps with nausea    Patient Active Problem List   Diagnosis Date Noted  . 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 Numbness 10/21/2019  . 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 Cystocele 01/13/2014    Past Surgical History:  Procedure Laterality Date  . ABDOMINAL HYSTERECTOMY  2013   ovaries removed   . BLADDER SURGERY    . BREAST BIOPSY Bilateral 2001  . cervical fusion C 5, C 6 and C 7  2004  . colonscopy  feb 2015   blood in stool, just was hemorrhoids  . CYSTOCELE REPAIR N/A 01/13/2014   Procedure: CYSTOSCOPY, REPAIR CYSTOCELE VAULT PROLAPSE/GRAFT;  Surgeon: Martina Sinner, MD;  Location: [facility];  Service: Urology;  Laterality: N/A;  . EYE SURGERY Bilateral 8 yrs ago   lens replacement for cataract  . GLAUCOMA SURGERY    .  SEPTOPLASTY Bilateral 03/01/2020   Procedure: NASAL SEPTOPLASTY;  Surgeon: Newman Pies, MD;  Location: [facility];  Service: ENT;  Laterality: Bilateral;  . tummy tuck with mesh  6 yrs ago   large abdominal is present     OB History   No obstetric history on file.     Family History  Problem Relation Age of Onset  . Breast cancer Maternal Aunt   . Breast cancer Maternal Aunt   . Breast cancer Maternal Aunt     Social History   Tobacco Use  . Smoking status: Never Smoker  . Smokeless tobacco: Never Used  Vaping Use  . Vaping Use: Never used  Substance Use Topics  . Alcohol use: Yes    Comment: occasional wine with dinner  . Drug use: No    Home Medications Prior to Admission medications   Medication Sig Start Date End Date Taking? Authorizing Provider  bimatoprost (LUMIGAN) 0.01 % SOLN 1 drop at bedtime.    [provider]  brimonidine (ALPHAGAN) 0.2 % ophthalmic solution 1 drop 3 (three) times daily.    [provider]  Carboxymethylcell-Hypromellose (GENTEAL OP) Apply 1 Dose to eye as needed.    [provider]  conjugated estrogens (PREMARIN) vaginal cream Place 1 Applicatorful vaginally 2 (two) times a week.    [provider]  Multiple Vitamin (MULTIVITAMIN)  tablet Take 1 tablet by mouth daily.    [provider]  pilocarpine (PILOCAR) 1 % ophthalmic solution PLACE 1 DROP INTO THE RIGHT EYE 3 TIMES DAILY. 04/10/20   [provider]  timolol (TIMOPTIC) 0.25 % ophthalmic solution 1 drop 2 (two) times daily.    [provider]    Allergies    Amoxicillin, Dorzolamide, Keflex [cephalexin], Levaquin [levofloxacin], Maxitrol [neomycin-polymyxin-dexameth], Methotrexate derivatives, Other, Restasis [cyclosporine], and Valtrex [valacyclovir]  Review of Systems   Review of Systems  Constitutional: Negative for fever.  HENT: Negative for sore throat.   Eyes: Negative for redness.  Respiratory: Negative  for shortness of breath.   Cardiovascular: Negative for chest pain.  Gastrointestinal: Positive for abdominal pain. Negative for vomiting.  Genitourinary: Negative for dysuria and flank pain.  Musculoskeletal: Negative for neck pain.  Skin: Negative for rash.  Neurological: Negative for headaches.  Hematological: Does not bruise/bleed easily.  Psychiatric/Behavioral: Negative for confusion.    Physical Exam Updated Vital Signs BP (!) 146/61   Pulse 76   Temp 99.4 F (37.4 C) (Oral)   Resp 16   Ht 1.626 m (5\' 4" )   Wt 81.6 kg   SpO2 97%   BMI 30.88 kg/m   Physical Exam Vitals and nursing note reviewed.  Constitutional:      Appearance: Normal appearance. She is well-developed.  HENT:     Head: Atraumatic.     Nose: Nose normal.     Mouth/Throat:     Mouth: Mucous membranes are moist.  Eyes:     General: No scleral icterus.    Conjunctiva/sclera: Conjunctivae normal.  Neck:     Trachea: No tracheal deviation.  Cardiovascular:     Rate and Rhythm: Normal rate and regular rhythm.     Pulses: Normal pulses.     Heart sounds: Normal heart sounds. No murmur heard.  No friction rub. No gallop.   Pulmonary:     Effort: Pulmonary effort is normal. No respiratory distress.     Breath sounds: Normal breath sounds.  Abdominal:     General: Bowel sounds are normal. There is no distension.     Palpations: Abdomen is soft. There is no mass.     Tenderness: There is abdominal tenderness. There is no guarding or rebound.     Hernia: No hernia is present.     Comments: LLQ tenderness.   Genitourinary:    Comments: No cva tenderness.  Musculoskeletal:        General: No swelling.     Cervical back: Normal range of motion and neck supple. No rigidity. No muscular tenderness.  Skin:    General: Skin is warm and dry.     Findings: No rash.     Comments: No skin lesions/rash in area of pain.   Neurological:     Mental Status: She is alert.     Comments: Alert, speech normal.     Psychiatric:        Mood and Affect: Mood normal.     ED Results / Procedures / Treatments   Labs (all labs ordered are listed, but only abnormal results are displayed) Results for orders placed or performed during the hospital encounter of 04/14/20  CBC  Result Value Ref Range   WBC 10.9 (H) 4.0 - 10.5 K/uL   RBC 4.24 3.87 - 5.11 MIL/uL   Hemoglobin 12.7 12.0 - 15.0 g/dL   HCT 04/16/20 36 - 46 %   MCV 92.9 80.0 - 100.0 fL  MCH 30.0 26.0 - 34.0 pg   MCHC 32.2 30.0 - 36.0 g/dL   RDW 78.213.4 95.611.5 - 21.315.5 %   Platelets 256 150 - 400 K/uL   nRBC 0.0 0.0 - 0.2 %  Comprehensive metabolic panel  Result Value Ref Range   Sodium 141 135 - 145 mmol/L   Potassium 3.7 3.5 - 5.1 mmol/L   Chloride 105 98 - 111 mmol/L   CO2 26 22 - 32 mmol/L   Glucose, Bld 106 (H) 70 - 99 mg/dL   BUN 13 8 - 23 mg/dL   Creatinine, Ser 0.860.78 0.44 - 1.00 mg/dL   Calcium 8.9 8.9 - 57.810.3 mg/dL   Total Protein 6.9 6.5 - 8.1 g/dL   Albumin 3.4 (L) 3.5 - 5.0 g/dL   AST 18 15 - 41 U/L   ALT 16 0 - 44 U/L   Alkaline Phosphatase 67 38 - 126 U/L   Total Bilirubin 0.6 0.3 - 1.2 mg/dL   GFR calc non Af Amer >60 >60 mL/min   GFR calc Af Amer >60 >60 mL/min   Anion gap 10 5 - 15  Urinalysis, Routine w reflex microscopic  Result Value Ref Range   Color, Urine YELLOW YELLOW   APPearance CLEAR CLEAR   Specific Gravity, Urine 1.015 1.005 - 1.030   pH 5.5 5.0 - 8.0   Glucose, UA NEGATIVE NEGATIVE mg/dL   Hgb urine dipstick TRACE (A) NEGATIVE   Bilirubin Urine NEGATIVE NEGATIVE   Ketones, ur NEGATIVE NEGATIVE mg/dL   Protein, ur NEGATIVE NEGATIVE mg/dL   Nitrite NEGATIVE NEGATIVE   Leukocytes,Ua NEGATIVE NEGATIVE  Urinalysis, Microscopic (reflex)  Result Value Ref Range   RBC / HPF 0-5 0 - 5 RBC/hpf   WBC, UA NONE SEEN 0 - 5 WBC/hpf   Bacteria, UA FEW (A) NONE SEEN   Squamous Epithelial / LPF 6-10 0 - 5    EKG None  Radiology CT Abdomen Pelvis W Contrast  Result Date: 04/14/2020 CLINICAL DATA:  Left lower  quadrant abdominal pain since yesterday. EXAM: CT ABDOMEN AND PELVIS WITH CONTRAST TECHNIQUE: Multidetector CT imaging of the abdomen and pelvis was performed using the standard protocol following bolus administration of intravenous contrast. CONTRAST:  100mL OMNIPAQUE IOHEXOL 300 MG/ML  SOLN COMPARISON:  None. FINDINGS: Lower chest: The lung bases are clear of acute process. No pleural effusion or pulmonary lesions. The heart is normal in size. No pericardial effusion. The distal esophagus and aorta are unremarkable. Scattered aortic calcifications. Hepatobiliary: Small scattered hepatic cysts. No worrisome hepatic lesions. The gallbladder is surgically absent. Associated mild intra and extrahepatic biliary dilatation. Pancreas: No mass, inflammation or ductal dilatation. Spleen: Normal size. No focal lesions. Adrenals/Urinary Tract: The adrenal glands and kidneys are unremarkable. The bladder is normal. Stomach/Bowel: The stomach, duodenum and small bowel are unremarkable. No acute inflammatory changes, mass lesions or obstructive findings. The terminal ileum and appendix are normal. Large segment of acute diverticulitis involving the mid to upper sigmoid colon. There is marked submucosal edema, wall thickening and pericolonic interstitial changes. No complicating features such as free air or abscess. The remainder of the colon is unremarkable. No inflammatory changes, mass lesion or obstructive findings. Severe sigmoid colon diverticulosis. Vascular/Lymphatic: Moderate atherosclerotic calcifications involving the aorta and iliac arteries but no aneurysm or dissection. The branch vessels are patent. The major venous structures are patent. A retroaortic left renal vein is noted incidentally. No mesenteric or retroperitoneal mass or adenopathy. Reproductive: The uterus is surgically absent. The left ovary is  slightly inflamed and directly adjacent to the area of diverticulitis. The right ovary appears normal. Other:  Small amount of free pelvic fluid. No pelvic abscess. Small inguinal hernias containing fat. Lower abdominal wall superficial varicosities are noted. Surgical changes from prior hernia repair. No recurrent hernia. Musculoskeletal: Scoliosis and degenerative changes but no acute findings or worrisome bone lesions. IMPRESSION: 1. Acute uncomplicated diverticulitis involving the mid to upper sigmoid colon. 2. No other significant abdominal/pelvic findings, mass lesions or adenopathy. 3. Status post cholecystectomy with mild intra and extrahepatic biliary dilatation. 4. Aortic atherosclerosis. Aortic Atherosclerosis (ICD10-I70.0). Electronically Signed   By: Rudie Meyer M.D.   On: 04/14/2020 09:29    Procedures Procedures (including critical care time)  Medications Ordered in ED Medications  sodium chloride 0.9 % bolus 1,000 mL (1,000 mLs Intravenous New Bag/Given 04/14/20 9030)  HYDROmorphone (DILAUDID) injection 0.5 mg (0.5 mg Intravenous Given 04/14/20 0822)  ondansetron (ZOFRAN) injection 4 mg (4 mg Intravenous Given 04/14/20 0923)    ED Course  I have reviewed the triage vital signs and the nursing notes.  Pertinent labs & imaging results that were available during my care of the patient were reviewed by me and considered in my medical decision making (see chart for details).    MDM Rules/Calculators/A&P                          Iv ns bolus. Dilaudid iv. zofran iv. Stat labs. Imaging ordered.   Reviewed nursing notes and prior charts for additional history.   Pain improved w meds.   Labs reviewed/interpreted by me - chem normal. WBC sl elevated, 10.9.   Ct reviewed/interpreted by me - c/w diverticulitis. Discussed w pt.   Recheck pt, comfortable, pain improved/resolved w meds. No nv.   Multiple antibiotic side effects noted, do not appear c/w allergic rxn. Zosyn iv.  Recheck, pt appears stable for d/c.   Discussed home rx/tx with pt, she prefers taking bactrim and flagyl  (instead of augmentin or cipro/flagyl). Indicates no hx allergy or intolerance to sulfa/bactrim.  rx provided.   Return precautions provided.      Final Clinical Impression(s) / ED Diagnoses Final diagnoses:  None    Rx / DC Orders ED Discharge Orders    None       Cathren Laine, MD 04/14/20 1102

## 2020-04-14 NOTE — Discharge Instructions (Signed)
It was our pleasure to provide your ER care today - we hope that you feel better.  Rest. Drink plenty of fluids.  Take bactrim and flagyl as prescribed (antibiotics). Take zofran as need for nausea. Take ultram as need for pain - no driving when taking.   Follow up with your doctor in 1 week if symptoms fail to improve/resolve.  Also follow up with your gi doctor.  Return to ER if worse, new symptoms, worsening or severe pain, persistent vomiting, weak/fainting, or other concern.

## 2020-04-14 NOTE — ED Notes (Signed)
ED Provider at bedside. 

## 2020-04-14 NOTE — ED Notes (Signed)
Patient transported to CT 

## 2020-04-14 NOTE — ED Triage Notes (Signed)
LLQ pain since yesterday.  Denies n/v/d.

## 2020-04-22 DIAGNOSIS — B078 Other viral warts: Secondary | ICD-10-CM | POA: Diagnosis not present

## 2020-04-26 DIAGNOSIS — H401113 Primary open-angle glaucoma, right eye, severe stage: Secondary | ICD-10-CM | POA: Diagnosis not present

## 2020-04-26 DIAGNOSIS — H401121 Primary open-angle glaucoma, left eye, mild stage: Secondary | ICD-10-CM | POA: Diagnosis not present

## 2020-04-28 ENCOUNTER — Other Ambulatory Visit: Payer: Self-pay

## 2020-04-28 ENCOUNTER — Encounter (INDEPENDENT_AMBULATORY_CARE_PROVIDER_SITE_OTHER): Payer: Self-pay | Admitting: Family Medicine

## 2020-04-28 ENCOUNTER — Ambulatory Visit (INDEPENDENT_AMBULATORY_CARE_PROVIDER_SITE_OTHER): Payer: Medicare PPO | Admitting: Family Medicine

## 2020-04-28 VITALS — BP 124/75 | HR 79 | Temp 98.4°F | Ht 63.0 in | Wt 171.0 lb

## 2020-04-28 DIAGNOSIS — R739 Hyperglycemia, unspecified: Secondary | ICD-10-CM | POA: Diagnosis not present

## 2020-04-28 DIAGNOSIS — Z0289 Encounter for other administrative examinations: Secondary | ICD-10-CM

## 2020-04-28 DIAGNOSIS — Z6830 Body mass index (BMI) 30.0-30.9, adult: Secondary | ICD-10-CM | POA: Diagnosis not present

## 2020-04-28 DIAGNOSIS — Z1331 Encounter for screening for depression: Secondary | ICD-10-CM | POA: Diagnosis not present

## 2020-04-28 DIAGNOSIS — E669 Obesity, unspecified: Secondary | ICD-10-CM | POA: Diagnosis not present

## 2020-04-28 DIAGNOSIS — R0602 Shortness of breath: Secondary | ICD-10-CM | POA: Diagnosis not present

## 2020-04-28 DIAGNOSIS — E559 Vitamin D deficiency, unspecified: Secondary | ICD-10-CM | POA: Diagnosis not present

## 2020-04-28 DIAGNOSIS — R5383 Other fatigue: Secondary | ICD-10-CM | POA: Diagnosis not present

## 2020-04-29 LAB — LIPID PANEL WITH LDL/HDL RATIO
Cholesterol, Total: 238 mg/dL — ABNORMAL HIGH (ref 100–199)
HDL: 58 mg/dL (ref >39)
LDL Chol Calc (NIH): 165 mg/dL — ABNORMAL HIGH (ref 0–99)
LDL/HDL Ratio: 2.8 ratio (ref 0.0–3.2)
Triglycerides: 88 mg/dL (ref 0–149)
VLDL Cholesterol Cal: 15 mg/dL (ref 5–40)

## 2020-04-29 LAB — INSULIN, RANDOM: INSULIN: 7.6 u[IU]/mL (ref 2.6–24.9)

## 2020-04-29 LAB — COMPREHENSIVE METABOLIC PANEL
ALT: 29 IU/L (ref 0–32)
AST: 21 IU/L (ref 0–40)
Albumin/Globulin Ratio: 1.5 (ref 1.2–2.2)
Albumin: 4.4 g/dL (ref 3.7–4.7)
Alkaline Phosphatase: 81 IU/L (ref 48–121)
BUN/Creatinine Ratio: 15 (ref 12–28)
BUN: 13 mg/dL (ref 8–27)
Bilirubin Total: 0.3 mg/dL (ref 0.0–1.2)
CO2: 27 mmol/L (ref 20–29)
Calcium: 10.1 mg/dL (ref 8.7–10.3)
Chloride: 101 mmol/L (ref 96–106)
Creatinine, Ser: 0.84 mg/dL (ref 0.57–1.00)
GFR calc Af Amer: 80 mL/min/{1.73_m2} (ref >59)
GFR calc non Af Amer: 69 mL/min/{1.73_m2} (ref >59)
Globulin, Total: 2.9 g/dL (ref 1.5–4.5)
Glucose: 93 mg/dL (ref 65–99)
Potassium: 4.5 mmol/L (ref 3.5–5.2)
Sodium: 139 mmol/L (ref 134–144)
Total Protein: 7.3 g/dL (ref 6.0–8.5)

## 2020-04-29 LAB — VITAMIN D 25 HYDROXY (VIT D DEFICIENCY, FRACTURES): Vit D, 25-Hydroxy: 31.7 ng/mL (ref 30.0–100.0)

## 2020-04-29 LAB — HEMOGLOBIN A1C
Est. average glucose Bld gHb Est-mCnc: 117 mg/dL
Hgb A1c MFr Bld: 5.7 % — ABNORMAL HIGH (ref 4.8–5.6)

## 2020-04-29 LAB — T3: T3, Total: 111 ng/dL (ref 71–180)

## 2020-04-29 LAB — T4: T4, Total: 8 ug/dL (ref 4.5–12.0)

## 2020-04-29 LAB — TSH: TSH: 1.56 u[IU]/mL (ref 0.450–4.500)

## 2020-05-03 NOTE — Progress Notes (Signed)
Chief Complaint:   OBESITY Maureen Elliott Elliott (MR# [id number]) is a 74 y.o. female who presents for evaluation and treatment of obesity and related comorbidities. Current BMI is Body mass index is 30.29 kg/m. Maureen Elliott Elliott has been struggling with her weight for many years and has been unsuccessful in either losing weight, maintaining weight loss, or reaching her healthy weight goal.  Maureen Elliott Elliott was diagnosed with diverticulitis in 03/2020. Her daughter is a patient here at our clinic. She notes some lactose intolerance. Breakfast-eggwich with toast, sugar free apple sauce, yogurt. Lunch, noon-sandwich, mustard, few slices of lunch meat, and thin cheese, carrots, and milk. Around 530 pm-chicken or fish (3-4 oz), canned carrots or green beans, with or without starch (mashed potatoes or canned potatoes 1/2 cup). Dessert-1/2 cup of fruit.   Maureen Elliott Elliott is currently in the action stage of change and ready to dedicate time achieving and maintaining a healthier weight. Maureen Elliott Elliott is interested in becoming our patient and working on intensive lifestyle modifications including (but not limited to) diet and exercise for weight loss.  Maureen Elliott Elliott's habits were reviewed today and are as follows: Her family eats meals together, she thinks her family will eat healthier with her, her desired weight loss is 26 lbs, she has been heavy most of her life, she started gaining weight at age 50, her heaviest weight ever was 225 pounds, she has significant food cravings issues, she snacks frequently in the evenings, she is frequently drinking liquids with calories, she frequently makes poor food choices, she frequently eats larger portions than normal and she struggles with emotional eating.  Depression Screen Maureen Elliott's Food and Mood (modified PHQ-9) score was 13.  Depression screen PHQ 2/9 04/28/2020  Decreased Interest 2  Down, Depressed, Hopeless 1  PHQ - 2 Score 3  Altered sleeping 2  Tired, decreased energy 3  Change in appetite 2    Feeling bad or failure about yourself  2  Trouble concentrating 1  Moving slowly or fidgety/restless 0  Suicidal thoughts 0  PHQ-9 Score 13  Difficult doing work/chores Somewhat difficult   Subjective:   1. Other fatigue Maureen Elliott admits to daytime somnolence and admits to waking up still tired. Patent has a history of symptoms of daytime fatigue. Maureen Elliott Elliott generally gets 6 or 7 hours of sleep per night, and states that she has difficulty falling asleep. Snoring is present. Apneic episodes are not present. Epworth Sleepiness Score is 5. EKG-normal sinus rhythm with rate variation and poor R wave progression.  2. SOB (shortness of breath) on exertion Maureen Elliott Elliott notes increasing shortness of breath with exercising and seems to be worsening over time with weight gain. She notes getting out of breath sooner with activity than she used to. This has not gotten worse recently. Maureen Elliott denies shortness of breath at rest or orthopnea.  3. Hyperglycemia Maureen Elliott Elliott's last blood sugar was elevated while in the hospital. She has no recent A1c or insulin.  4. Vitamin D deficiency Maureen Elliott Elliott's diagnosis is likely given obesity, and she notes fatigue.  Assessment/Plan:   1. Other fatigue Maureen Elliott Elliott does feel that her weight is causing her energy to be lower than it should be. Fatigue may be related to obesity, depression or many other causes. Labs will be ordered, and in the meanwhile, Maureen Elliott Elliott will focus on self care including making healthy food choices, increasing physical activity and focusing on stress reduction.  - EKG 12-Lead - Comprehensive metabolic panel - Lipid Panel With LDL/HDL Ratio - TSH - T4 - T3  2. SOB (shortness of breath) on exertion Maureen Elliott Elliott does feel that she gets out of breath more easily that she used to when she exercises. Maureen Elliott's shortness of breath appears to be obesity related and exercise induced. She has agreed to work on weight loss and gradually increase exercise to treat her exercise induced  shortness of breath. Will continue to monitor closely.  - Comprehensive metabolic panel - Lipid Panel With LDL/HDL Ratio - TSH - T4 - T3  3. Hyperglycemia Fasting labs will be obtained today, and results with be discussed with Maureen Elliott Elliott in 2 weeks at her follow up visit. In the meanwhile Maureen Elliott was started on a lower simple carbohydrate diet and will work on weight loss efforts.  - Hemoglobin A1c - Insulin, random  4. Vitamin D deficiency Low Vitamin D level contributes to fatigue and are associated with obesity, breast, and colon cancer. We will check labs today. Maureen Elliott Elliott will follow-up for routine testing of Vitamin D, at least 2-3 times per year to avoid over-replacement.  - VITAMIN D 25 Hydroxy (Vit-D Deficiency, Fractures)  5. Depression screening Maureen Elliott Elliott had a positive depression screening. Depression is commonly associated with obesity and often results in emotional eating behaviors. We will monitor this closely and work on CBT to help improve the non-hunger eating patterns. Referral to Psychology may be required if no improvement is seen as she continues in our clinic.  6. Class 1 obesity with serious comorbidity and body mass index (BMI) of 30.0 to 30.9 in adult, unspecified obesity type Maureen Elliott Elliott is currently in the action stage of change and her goal is to continue with weight loss efforts. I recommend Maureen Elliott Elliott begin the structured treatment plan as follows:  She has agreed to the Category 1 Plan + 100 calories.  Exercise goals: No exercise has been prescribed at this time.   Behavioral modification strategies: increasing lean protein intake, increasing vegetables, meal planning and cooking strategies, keeping healthy foods in the home and planning for success.  She was informed of the importance of frequent follow-up visits to maximize her success with intensive lifestyle modifications for her multiple health conditions. She was informed we would discuss her lab results at her next visit  unless there is a critical issue that needs to be addressed sooner. Maureen Elliott Elliott agreed to keep her next visit at the agreed upon time to discuss these results.  Objective:   Blood pressure 124/75, pulse 79, temperature 98.4 F (36.9 C), temperature source Oral, height 5\' 3"  (1.6 m), weight 171 lb (77.6 kg), SpO2 95 %. Body mass index is 30.29 kg/m.  EKG: Normal sinus rhythm, rate 70 BPM.  Indirect Calorimeter completed today shows a VO2 of 181 and a REE of 1263.  Her calculated basal metabolic rate is thus her basal metabolic rate is worse than expected.  General: Cooperative, alert, well developed, in no acute distress. HEENT: Conjunctivae and lids unremarkable. Cardiovascular: Regular rhythm.  Lungs: Normal work of breathing. Neurologic: No focal deficits.   Lab Results  Component Value Date   CREATININE 0.84 04/28/2020   BUN 13 04/28/2020   NA 139 04/28/2020   K 4.5 04/28/2020   CL 101 04/28/2020   CO2 27 04/28/2020   Lab Results  Component Value Date   ALT 29 04/28/2020   AST 21 04/28/2020   ALKPHOS 81 04/28/2020   BILITOT 0.3 04/28/2020   Lab Results  Component Value Date   HGBA1C 5.7 (H) 04/28/2020   Lab Results  Component Value Date   INSULIN  7.6 04/28/2020   Lab Results  Component Value Date   TSH 1.560 04/28/2020   Lab Results  Component Value Date   CHOL 238 (H) 04/28/2020   HDL 58 04/28/2020   LDLCALC 165 (H) 04/28/2020   TRIG 88 04/28/2020   Lab Results  Component Value Date   WBC 10.9 (H) 04/14/2020   HGB 12.7 04/14/2020   HCT 39.4 04/14/2020   MCV 92.9 04/14/2020   PLT 256 04/14/2020   No results found for: IRON, TIBC, FERRITIN Obesity Behavioral Intervention Visit Documentation for Insurance:   Approximately 15 minutes were spent on the discussion below.  ASK: We discussed the diagnosis of obesity with Maureen Elliott Elliott today and Ellana agreed to give [place] permission to discuss obesity behavioral modification therapy today.  ASSESS: Chanel has the  diagnosis of obesity and her BMI today is 30.3. Shannie is in the action stage of change.   ADVISE: Juliauna was educated on the multiple health risks of obesity as well as the benefit of weight loss to improve her health. She was advised of the need for long term treatment and the importance of lifestyle modifications to improve her current health and to decrease her risk of future health problems.  AGREE: Multiple dietary modification options and treatment options were discussed and Lynnsie agreed to follow the recommendations documented in the above note.  ARRANGE: Amoria was educated on the importance of frequent visits to treat obesity as outlined per CMS and USPSTF guidelines and agreed to schedule her next follow up appointment today.  Attestation Statements:   This is the patient's first visit at [facility]. The patient's NEW PATIENT PACKET was reviewed at length. Included in the packet: current and past health history, medications, allergies, ROS, gynecologic history (women only), surgical history, family history, social history, weight history, weight loss surgery history (for those that have had weight loss surgery), nutritional evaluation, mood and food questionnaire, PHQ9, Epworth questionnaire, sleep habits questionnaire, patient life and health improvement goals questionnaire. These will all be scanned into the patient's chart under media.   During the visit, I independently reviewed the patient's EKG, bioimpedance scale results, and indirect calorimeter results. I used this information to tailor a meal plan for the patient that will help her to lose weight and will improve her obesity-related conditions going forward. I performed a medically necessary appropriate examination and/or evaluation. I discussed the assessment and treatment plan with the patient. The patient was provided an opportunity to ask questions and all were answered. The patient agreed with the plan and  demonstrated an understanding of the instructions. Labs were ordered at this visit and will be reviewed at the next visit unless more critical results need to be addressed immediately. Clinical information was updated and documented in the EMR.   Time spent on visit including pre-visit chart review and post-visit care was 45 minutes.   A separate 15 minutes was spent on risk counseling (see above).    I, Burt Knack, am acting as transcriptionist for Reuben Likes, MD.  I have reviewed the above documentation for accuracy and completeness, and I agree with the above. - Katherina Mires, MD

## 2020-05-05 ENCOUNTER — Ambulatory Visit (INDEPENDENT_AMBULATORY_CARE_PROVIDER_SITE_OTHER): Payer: Medicare PPO | Admitting: Family Medicine

## 2020-05-12 ENCOUNTER — Ambulatory Visit (INDEPENDENT_AMBULATORY_CARE_PROVIDER_SITE_OTHER): Payer: Medicare PPO | Admitting: Family Medicine

## 2020-05-12 ENCOUNTER — Other Ambulatory Visit: Payer: Self-pay

## 2020-05-12 ENCOUNTER — Encounter (INDEPENDENT_AMBULATORY_CARE_PROVIDER_SITE_OTHER): Payer: Self-pay | Admitting: Family Medicine

## 2020-05-12 VITALS — BP 134/82 | HR 82 | Temp 98.7°F | Ht 63.0 in | Wt 171.0 lb

## 2020-05-12 DIAGNOSIS — R7303 Prediabetes: Secondary | ICD-10-CM | POA: Diagnosis not present

## 2020-05-12 DIAGNOSIS — E669 Obesity, unspecified: Secondary | ICD-10-CM

## 2020-05-12 DIAGNOSIS — E559 Vitamin D deficiency, unspecified: Secondary | ICD-10-CM | POA: Diagnosis not present

## 2020-05-12 DIAGNOSIS — E7849 Other hyperlipidemia: Secondary | ICD-10-CM

## 2020-05-12 DIAGNOSIS — Z6830 Body mass index (BMI) 30.0-30.9, adult: Secondary | ICD-10-CM | POA: Diagnosis not present

## 2020-05-12 MED ORDER — ATORVASTATIN CALCIUM 10 MG PO TABS: 10.0000 mg | ORAL_TABLET | Freq: Every day | ORAL | 0 refills | Status: DC

## 2020-05-12 MED ORDER — VITAMIN D (ERGOCALCIFEROL) 1.25 MG (50000 UNIT) PO CAPS: 50000.0000 [IU] | ORAL_CAPSULE | ORAL | 0 refills | Status: DC

## 2020-05-13 NOTE — Progress Notes (Signed)
Chief Complaint:   OBESITY Maureen Elliott is here to discuss her progress with her obesity treatment plan along with follow-up of her obesity related diagnoses. Maureen Elliott is on the Category 1 Plan + 100 calories and states she is following her eating plan approximately 98% of the time. Maureen Elliott states she is doing water exercise and Silver Sneakers for 45 minutes 5 times per week.  Today's visit was #: 2 Starting weight: 171 lbs Starting date: 04/28/2020 Today's weight: 171 lbs Today's date: 05/12/2020 Total lbs lost to date: 0 Total lbs lost since last in-office visit: 0  Interim History: Maureen Elliott went to the [employer] and got mostly fresh food. She felt minimal hunger and felt satisfied.   Subjective:   1. Vitamin D deficiency Maureen Elliott denies nausea, vomiting, or muscle weakness, but she notes fatigue. Last Vit D was 31.7 and is on Vit D OTC 1,000 IU daily. I discussed labs with the patient today.  2. Pre-diabetes Maureen Elliott has Elliott new diagnosis of pre-diabetes. Last A1c was 5.7 and insulin 7.6. She is not on medications. She voices she's been eating candy and girl scout cookies during the pandemic. I discussed labs with the patient today.  3. Other hyperlipidemia Maureen Elliott's last LDL was 165, HDL 58, and triglycerides 88, and total cholesterol of 238. She is not on statin. Her 10 years ASCVD risk score is 14.5%. Modified intensity statin encouraged. I discussed labs with the patient today.  Assessment/Plan:   1. Vitamin D deficiency Low Vitamin D level contributes to fatigue and are associated with obesity, breast, and colon cancer. Kemari agreed to start prescription Vitamin D 50,000 IU every week with no refills. She will follow-up for routine testing of Vitamin D, at least 2-3 times per year to avoid over-replacement.  - Vitamin D, Ergocalciferol, (DRISDOL) 1.25 MG (50000 UNIT) CAPS capsule; Take 1 capsule (50,000 Units total) by mouth every 7 (seven) days.  Dispense: 4 capsule; Refill: 0  2.  Pre-diabetes Maureen Elliott will continue to work on weight loss, exercise, and decreasing simple carbohydrates to help decrease the risk of diabetes. We will repeat labs in 3 months.  3. Other hyperlipidemia Cardiovascular risk and specific lipid/LDL goals reviewed. We discussed several lifestyle modifications today. Maureen Elliott is to call in previous FLP to see what previous panel was; may start medication depending on results. Maureen Elliott agreed to start atorvastatin 10 mg PO daily with no refills. Maureen Elliott will continue to work on diet, exercise and weight loss efforts. Orders and follow up as documented in patient record.   Counseling Intensive lifestyle modifications are the first line treatment for this issue. . Dietary changes: Increase soluble fiber. Decrease simple carbohydrates. . Exercise changes: Moderate to vigorous-intensity aerobic activity 150 minutes per week if tolerated. . Lipid-lowering medications: see documented in medical record.  - atorvastatin (LIPITOR) 10 MG tablet; Take 1 tablet (10 mg total) by mouth daily.  Dispense: 30 tablet; Refill: 0  4. Class 1 obesity with serious comorbidity and body mass index (BMI) of 30.0 to 30.9 in adult, unspecified obesity type Maureen Elliott is currently in the action stage of change. As such, her goal is to continue with weight loss efforts. She has agreed to the Category 1 Plan + 100 calories.   Exercise goals: All adults should avoid inactivity. Some physical activity is better than none, and adults who participate in any amount of physical activity gain some health benefits.  Behavioral modification strategies: increasing lean protein intake, increasing vegetables, meal planning and cooking  strategies, keeping healthy foods in the home and planning for success.  Maureen Elliott has agreed to follow-up with our clinic in 2 weeks. She was informed of the importance of frequent follow-up visits to maximize her success with intensive lifestyle modifications for her multiple  health conditions.   Objective:   Blood pressure 134/82, pulse 82, temperature 98.7 F (37.1 C), temperature source Oral, height 5\' 3"  (1.6 m), weight 171 lb (77.6 kg), SpO2 95 %. Body mass index is 30.29 kg/m.  General: Cooperative, alert, well developed, in no acute distress. HEENT: Conjunctivae and lids unremarkable. Cardiovascular: Regular rhythm.  Lungs: Normal work of breathing. Neurologic: No focal deficits.   Lab Results  Component Value Date   CREATININE 0.84 04/28/2020   BUN 13 04/28/2020   NA 139 04/28/2020   K 4.5 04/28/2020   CL 101 04/28/2020   CO2 27 04/28/2020   Lab Results  Component Value Date   ALT 29 04/28/2020   AST 21 04/28/2020   ALKPHOS 81 04/28/2020   BILITOT 0.3 04/28/2020   Lab Results  Component Value Date   HGBA1C 5.7 (H) 04/28/2020   Lab Results  Component Value Date   INSULIN 7.6 04/28/2020   Lab Results  Component Value Date   TSH 1.560 04/28/2020   Lab Results  Component Value Date   CHOL 238 (H) 04/28/2020   HDL 58 04/28/2020   LDLCALC 165 (H) 04/28/2020   TRIG 88 04/28/2020   Lab Results  Component Value Date   WBC 10.9 (H) 04/14/2020   HGB 12.7 04/14/2020   HCT 39.4 04/14/2020   MCV 92.9 04/14/2020   PLT 256 04/14/2020   No results found for: IRON, TIBC, FERRITIN  Obesity Behavioral Intervention Documentation for Insurance:   Approximately 15 minutes were spent on the discussion below.  ASK: We discussed the diagnosis of obesity with 04/16/2020 today and Aliea agreed to give Maureen Elliott permission to discuss obesity behavioral modification therapy today.  ASSESS: Orelia has the diagnosis of obesity and her BMI today is 30.3. Genesis is in the action stage of change.   ADVISE: Suetta was educated on the multiple health risks of obesity as well as the benefit of weight loss to improve her health. She was advised of the need for long term treatment and the importance of lifestyle modifications to improve her current health and to  decrease her risk of future health problems.  AGREE: Multiple dietary modification options and treatment options were discussed and Garrie agreed to follow the recommendations documented in the above note.  ARRANGE: Andria was educated on the importance of frequent visits to treat obesity as outlined per CMS and USPSTF guidelines and agreed to schedule her next follow up appointment today.  Attestation Statements:   Reviewed by clinician on day of visit: allergies, medications, problem list, medical history, surgical history, family history, social history, and previous encounter notes.   I, Maureen Elliott, am acting as transcriptionist for Burt Knack, MD.  I have reviewed the above documentation for accuracy and completeness, and I agree with the above. - Reuben Likes, MD

## 2020-05-26 ENCOUNTER — Encounter (INDEPENDENT_AMBULATORY_CARE_PROVIDER_SITE_OTHER): Payer: Self-pay | Admitting: Family Medicine

## 2020-05-26 ENCOUNTER — Ambulatory Visit (INDEPENDENT_AMBULATORY_CARE_PROVIDER_SITE_OTHER): Payer: Medicare PPO | Admitting: Family Medicine

## 2020-05-26 ENCOUNTER — Other Ambulatory Visit: Payer: Self-pay

## 2020-05-26 VITALS — BP 128/73 | HR 77 | Temp 98.4°F | Ht 63.0 in | Wt 168.0 lb

## 2020-05-26 DIAGNOSIS — Z6830 Body mass index (BMI) 30.0-30.9, adult: Secondary | ICD-10-CM | POA: Diagnosis not present

## 2020-05-26 DIAGNOSIS — E559 Vitamin D deficiency, unspecified: Secondary | ICD-10-CM

## 2020-05-26 DIAGNOSIS — E669 Obesity, unspecified: Secondary | ICD-10-CM

## 2020-05-26 DIAGNOSIS — R7303 Prediabetes: Secondary | ICD-10-CM | POA: Diagnosis not present

## 2020-05-26 MED ORDER — VITAMIN D (ERGOCALCIFEROL) 1.25 MG (50000 UNIT) PO CAPS: 50000.0000 [IU] | ORAL_CAPSULE | ORAL | 0 refills | Status: DC

## 2020-05-27 NOTE — Progress Notes (Signed)
Chief Complaint:   OBESITY Maureen Elliott is here to discuss her progress with her obesity treatment plan along with follow-up of her obesity related diagnoses. Maureen Elliott is on the Category 1 Plan + 100 calories and states she is following her eating plan approximately 98% of the time. Maureen Elliott states she is at the gym doing water aerobics or Silver sneakers for 45-60 minutes 5 times per week.  Today's visit was #: 3 Starting weight: 171 lbs Starting date: 04/28/2020 Today's weight: 168 lbs Today's date: 05/26/2020 Total lbs lost to date: 3 Total lbs lost since last in-office visit: 3  Interim History: Maureen Elliott brought her food log in today. She is feeling better overall. She has followed the plan almost completely and has added in physical activity. She denies hunger. She is eating the same snacks daily.  Subjective:   1. Vitamin D deficiency Maureen Elliott denies nausea, vomiting, or muscle weakness, but she notes fatigue. She is on prescription Vit D.  2. Pre-diabetes Maureen Elliott's last A1c was 5.7 and insulin 7.6. She is not on medications.   Assessment/Plan:   1. Vitamin D deficiency Low Vitamin D level contributes to fatigue and are associated with obesity, breast, and colon cancer. We will refill prescription Vitamin D for 1 month. Maureen Elliott will follow-up for routine testing of Vitamin D, at least 2-3 times per year to avoid over-replacement.  - Vitamin D, Ergocalciferol, (DRISDOL) 1.25 MG (50000 UNIT) CAPS capsule; Take 1 capsule (50,000 Units total) by mouth every 7 (seven) days.  Dispense: 4 capsule; Refill: 0  2. Pre-diabetes Maureen Elliott will continue to work on weight loss, exercise, and decreasing simple carbohydrates to help decrease the risk of diabetes. We will repeat labs in 2 months.  3. Class 1 obesity with serious comorbidity and body mass index (BMI) of 30.0 to 30.9 in adult, unspecified obesity type Maureen Elliott is currently in the action stage of change. As such, her goal is to continue with weight loss  efforts. She has agreed to the Category 1 Plan + 100 calories.   Exercise goals: As is.  Behavioral modification strategies: increasing lean protein intake, increasing vegetables, meal planning and cooking strategies, keeping healthy foods in the home and planning for success.  Maureen Elliott has agreed to follow-up with our clinic in 2 weeks. She was informed of the importance of frequent follow-up visits to maximize her success with intensive lifestyle modifications for her multiple health conditions.   Objective:   Blood pressure 128/73, pulse 77, temperature 98.4 F (36.9 C), temperature source Oral, height 5\' 3"  (1.6 m), weight 168 lb (76.2 kg), SpO2 97 %. Body mass index is 29.76 kg/m.  General: Cooperative, alert, well developed, in no acute distress. HEENT: Conjunctivae and lids unremarkable. Cardiovascular: Regular rhythm.  Lungs: Normal work of breathing. Neurologic: No focal deficits.   Lab Results  Component Value Date   CREATININE 0.84 04/28/2020   BUN 13 04/28/2020   NA 139 04/28/2020   K 4.5 04/28/2020   CL 101 04/28/2020   CO2 27 04/28/2020   Lab Results  Component Value Date   ALT 29 04/28/2020   AST 21 04/28/2020   ALKPHOS 81 04/28/2020   BILITOT 0.3 04/28/2020   Lab Results  Component Value Date   HGBA1C 5.7 (H) 04/28/2020   Lab Results  Component Value Date   INSULIN 7.6 04/28/2020   Lab Results  Component Value Date   TSH 1.560 04/28/2020   Lab Results  Component Value Date   CHOL 238 (H)  04/28/2020   HDL 58 04/28/2020   LDLCALC 165 (H) 04/28/2020   TRIG 88 04/28/2020   Lab Results  Component Value Date   WBC 10.9 (H) 04/14/2020   HGB 12.7 04/14/2020   HCT 39.4 04/14/2020   MCV 92.9 04/14/2020   PLT 256 04/14/2020   No results found for: IRON, TIBC, FERRITIN  Obesity Behavioral Intervention Documentation for Insurance:   Approximately 15 minutes were spent on the discussion below.  ASK: We discussed the diagnosis of obesity with Maureen Elliott  today and Maureen Elliott agreed to give [place] permission to discuss obesity behavioral modification therapy today.  ASSESS: Maureen Elliott has the diagnosis of obesity and her BMI today is 29.77. Maureen Elliott is in the action stage of change.   ADVISE: Maureen Elliott was educated on the multiple health risks of obesity as well as the benefit of weight loss to improve her health. She was advised of the need for long term treatment and the importance of lifestyle modifications to improve her current health and to decrease her risk of future health problems.  AGREE: Multiple dietary modification options and treatment options were discussed and Maureen Elliott agreed to follow the recommendations documented in the above note.  ARRANGE: Maureen Elliott was educated on the importance of frequent visits to treat obesity as outlined per CMS and USPSTF guidelines and agreed to schedule her next follow up appointment today.  Attestation Statements:   Reviewed by clinician on day of visit: allergies, medications, problem list, medical history, surgical history, family history, social history, and previous encounter notes.   I, Burt Knack, am acting as transcriptionist for Reuben Likes, MD.  I have reviewed the above documentation for accuracy and completeness, and I agree with the above. - Katherina Mires, MD

## 2020-05-31 ENCOUNTER — Other Ambulatory Visit (INDEPENDENT_AMBULATORY_CARE_PROVIDER_SITE_OTHER): Payer: Self-pay | Admitting: Family Medicine

## 2020-05-31 DIAGNOSIS — E7849 Other hyperlipidemia: Secondary | ICD-10-CM

## 2020-06-01 ENCOUNTER — Telehealth (INDEPENDENT_AMBULATORY_CARE_PROVIDER_SITE_OTHER): Payer: Self-pay | Admitting: Family Medicine

## 2020-06-01 ENCOUNTER — Other Ambulatory Visit (INDEPENDENT_AMBULATORY_CARE_PROVIDER_SITE_OTHER): Payer: Self-pay

## 2020-06-01 DIAGNOSIS — E7849 Other hyperlipidemia: Secondary | ICD-10-CM

## 2020-06-01 MED ORDER — ATORVASTATIN CALCIUM 10 MG PO TABS: 10.0000 mg | ORAL_TABLET | Freq: Every day | ORAL | 0 refills | Status: DC

## 2020-06-01 NOTE — Telephone Encounter (Signed)
Maureen Elliott called requesting a refill for her Lipitor 10mg  she uses [facility] in . She would like a call to let her know when it is ready.

## 2020-06-01 NOTE — Telephone Encounter (Signed)
Rx sent to pharmacy and I left a message on the pt's voicemail. CAS

## 2020-06-02 ENCOUNTER — Other Ambulatory Visit (INDEPENDENT_AMBULATORY_CARE_PROVIDER_SITE_OTHER): Payer: Self-pay | Admitting: Family Medicine

## 2020-06-02 DIAGNOSIS — E559 Vitamin D deficiency, unspecified: Secondary | ICD-10-CM

## 2020-06-14 DIAGNOSIS — H401113 Primary open-angle glaucoma, right eye, severe stage: Secondary | ICD-10-CM | POA: Diagnosis not present

## 2020-06-14 DIAGNOSIS — H401121 Primary open-angle glaucoma, left eye, mild stage: Secondary | ICD-10-CM | POA: Diagnosis not present

## 2020-06-15 ENCOUNTER — Other Ambulatory Visit: Payer: Self-pay

## 2020-06-15 ENCOUNTER — Encounter (INDEPENDENT_AMBULATORY_CARE_PROVIDER_SITE_OTHER): Payer: Self-pay | Admitting: Family Medicine

## 2020-06-15 ENCOUNTER — Ambulatory Visit (INDEPENDENT_AMBULATORY_CARE_PROVIDER_SITE_OTHER): Payer: Medicare PPO | Admitting: Family Medicine

## 2020-06-15 ENCOUNTER — Other Ambulatory Visit (INDEPENDENT_AMBULATORY_CARE_PROVIDER_SITE_OTHER): Payer: Self-pay | Admitting: Family Medicine

## 2020-06-15 VITALS — BP 123/67 | HR 71 | Temp 98.1°F | Ht 63.0 in | Wt 165.0 lb

## 2020-06-15 DIAGNOSIS — Z6841 Body Mass Index (BMI) 40.0 and over, adult: Secondary | ICD-10-CM

## 2020-06-15 DIAGNOSIS — E559 Vitamin D deficiency, unspecified: Secondary | ICD-10-CM

## 2020-06-15 DIAGNOSIS — R7303 Prediabetes: Secondary | ICD-10-CM

## 2020-06-15 MED ORDER — VITAMIN D (ERGOCALCIFEROL) 1.25 MG (50000 UNIT) PO CAPS: 50000.0000 [IU] | ORAL_CAPSULE | ORAL | 0 refills | Status: DC

## 2020-06-16 NOTE — Progress Notes (Signed)
Chief Complaint:   OBESITY Maureen Elliott is here to discuss her progress with her obesity treatment plan along with follow-up of her obesity related diagnoses. Maureen Elliott is on the Category 1 Plan + 100 calories and states she is following her eating plan approximately 98% of the time. Maureen Elliott states she is walking for 20 minutes 2 times per week, and water exercise or silver sneakers for 60 minutes 5 times per week.  Today's visit was #: 4 Starting weight: 171 lbs Starting date: 04/28/2020 Today's weight: 165 lbs Today's date: 06/15/2020 Total lbs lost to date: 6 Total lbs lost since last in-office visit: 3  Interim History: Maureen Elliott went to the beach over the last week (went to [employer]), but she did go to the grocery store and ate on the plan. She has not been hungry. She has been doing Maureen Elliott burgers occasionally. She reports she is feeling much better since starting the program.  Subjective:   1. Vitamin D deficiency Maureen Elliott denies nausea, vomiting, or muscle weakness, but she notes fatigue. She is on prescription Vit D.  2. Pre-diabetes Maureen Elliott's last A1c was 5.7 and insulin 7.6. She is not on medications, and she notes more control in comfort eating.  Assessment/Plan:   1. Vitamin D deficiency Low Vitamin D level contributes to fatigue and are associated with obesity, breast, and colon cancer. We will refill prescription Vitamin D for 1 month. Tiasia will follow-up for routine testing of Vitamin D, at least 2-3 times per year to avoid over-replacement.  - Vitamin D, Ergocalciferol, (DRISDOL) 1.25 MG (50000 UNIT) CAPS capsule; Take 1 capsule (50,000 Units total) by mouth every 7 (seven) days.  Dispense: 4 capsule; Refill: 0  2. Pre-diabetes Maureen Elliott will continue to work on weight loss, exercise, and decreasing simple carbohydrates to help decrease the risk of diabetes. We will follow up on labs in 2 months.  3. Class 3 severe obesity with serious comorbidity and body mass index (BMI) of 40.0  to 44.9 in adult, unspecified obesity type Maureen Elliott) Maureen Elliott is currently in the action stage of change. As such, her goal is to continue with weight loss efforts. She has agreed to the Category 1 Plan + 100 calories.   Exercise goals: As is.  Behavioral modification strategies: increasing lean protein intake, meal planning and cooking strategies, keeping healthy foods in the home and planning for success.  Maureen Elliott has agreed to follow-up with our clinic in 2 weeks. She was informed of the importance of frequent follow-up visits to maximize her success with intensive lifestyle modifications for her multiple health conditions.   Objective:   Blood pressure 123/67, pulse 71, temperature 98.1 F (36.7 C), temperature source Oral, height 5\' 3"  (1.6 m), weight 165 lb (74.8 kg), SpO2 97 %. Body mass index is 29.23 kg/m.  General: Cooperative, alert, well developed, in no acute distress. HEENT: Conjunctivae and lids unremarkable. Cardiovascular: Regular rhythm.  Lungs: Normal work of breathing. Neurologic: No focal deficits.   Lab Results  Component Value Date   CREATININE 0.84 04/28/2020   BUN 13 04/28/2020   NA 139 04/28/2020   K 4.5 04/28/2020   CL 101 04/28/2020   CO2 27 04/28/2020   Lab Results  Component Value Date   ALT 29 04/28/2020   AST 21 04/28/2020   ALKPHOS 81 04/28/2020   BILITOT 0.3 04/28/2020   Lab Results  Component Value Date   HGBA1C 5.7 (H) 04/28/2020   Lab Results  Component Value Date   INSULIN  7.6 04/28/2020   Lab Results  Component Value Date   TSH 1.560 04/28/2020   Lab Results  Component Value Date   CHOL 238 (H) 04/28/2020   HDL 58 04/28/2020   LDLCALC 165 (H) 04/28/2020   TRIG 88 04/28/2020   Lab Results  Component Value Date   WBC 10.9 (H) 04/14/2020   HGB 12.7 04/14/2020   HCT 39.4 04/14/2020   MCV 92.9 04/14/2020   PLT 256 04/14/2020   No results found for: IRON, TIBC, FERRITIN  Obesity Behavioral Intervention:   Approximately 15  minutes were spent on the discussion below.  ASK: We discussed the diagnosis of obesity with Maureen Elliott today and Maureen Elliott agreed to give [place] permission to discuss obesity behavioral modification therapy today.  ASSESS: Maureen Elliott has the diagnosis of obesity and her BMI today is 29.24. Maureen Elliott is in the action stage of change.   ADVISE: Maureen Elliott was educated on the multiple health risks of obesity as well as the benefit of weight loss to improve her health. She was advised of the need for long term treatment and the importance of lifestyle modifications to improve her current health and to decrease her risk of future health problems.  AGREE: Multiple dietary modification options and treatment options were discussed and Maureen Elliott agreed to follow the recommendations documented in the above note.  ARRANGE: Maureen Elliott was educated on the importance of frequent visits to treat obesity as outlined per CMS and USPSTF guidelines and agreed to schedule her next follow up appointment today.  Attestation Statements:   Reviewed by clinician on day of visit: allergies, medications, problem list, medical history, surgical history, family history, social history, and previous encounter notes.   I, Burt Knack, am acting as transcriptionist for Reuben Likes, MD.  I have reviewed the above documentation for accuracy and completeness, and I agree with the above. - Katherina Mires, MD

## 2020-06-29 ENCOUNTER — Other Ambulatory Visit: Payer: Self-pay

## 2020-06-29 ENCOUNTER — Encounter (INDEPENDENT_AMBULATORY_CARE_PROVIDER_SITE_OTHER): Payer: Self-pay | Admitting: Family Medicine

## 2020-06-29 ENCOUNTER — Ambulatory Visit (INDEPENDENT_AMBULATORY_CARE_PROVIDER_SITE_OTHER): Payer: Medicare PPO | Admitting: Family Medicine

## 2020-06-29 VITALS — BP 149/75 | HR 66 | Temp 98.4°F | Ht 63.0 in | Wt 163.0 lb

## 2020-06-29 DIAGNOSIS — R030 Elevated blood-pressure reading, without diagnosis of hypertension: Secondary | ICD-10-CM | POA: Diagnosis not present

## 2020-06-29 DIAGNOSIS — E669 Obesity, unspecified: Secondary | ICD-10-CM

## 2020-06-29 DIAGNOSIS — E7849 Other hyperlipidemia: Secondary | ICD-10-CM | POA: Diagnosis not present

## 2020-06-29 DIAGNOSIS — Z6830 Body mass index (BMI) 30.0-30.9, adult: Secondary | ICD-10-CM | POA: Diagnosis not present

## 2020-06-29 MED ORDER — ATORVASTATIN CALCIUM 10 MG PO TABS: 10.0000 mg | ORAL_TABLET | Freq: Every day | ORAL | 0 refills | Status: DC

## 2020-06-29 NOTE — Progress Notes (Signed)
Chief Complaint:   OBESITY Kijuana is here to discuss her progress with her obesity treatment plan along with follow-up of her obesity related diagnoses. Detria is on the Category 1 Plan + 100 calories and states she is following her eating plan approximately 98% of the time. 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  Today's visit was #: 5 Starting weight: 171 lbs Starting date: 04/28/2020 Today's weight: 163 lbs Today's date: 06/29/2020 Total lbs lost to date: 8 Total lbs lost since last in-office visit: 2  Interim History: Careli voices the last few weeks she has been following the plan 98%. She has no plans for the upcoming few weeks. At the end of October she is going up to [place] for her brother's wedding celebration. She denies cravings or hunger.  Subjective:   1. Other hyperlipidemia Cayci is on statin, and she denies myalgias or transaminitis.  2. Elevated BP without diagnosis of hypertension Donovan's blood pressure is slightly high today. She denies chest pain, chest pressure, or headache. She denies a history of hypertension.  Assessment/Plan:   1. Other hyperlipidemia Cardiovascular risk and specific lipid/LDL goals reviewed. We discussed several lifestyle modifications today and Haileyann will continue to work on diet, exercise and weight loss efforts. We will refill atorvastatin for 1 month. Orders and follow up as documented in patient record.   Counseling Intensive lifestyle modifications are the first line treatment for this issue. . Dietary changes: Increase soluble fiber. Decrease simple carbohydrates. . Exercise changes: Moderate to vigorous-intensity aerobic activity 150 minutes per week if tolerated. . Lipid-lowering medications: see documented in medical record.  - atorvastatin (LIPITOR) 10 MG tablet; Take 1 tablet (10 mg total) by mouth daily.  Dispense: 30 tablet; Refill: 0  2. Elevated BP without diagnosis of  hypertension Brigitte is working on healthy weight loss and exercise to improve blood pressure control. We will watch for signs of hypotension as she continues her lifestyle modifications. We will follow up on her blood pressure at her next appointment.  3. Class 1 obesity with serious comorbidity and body mass index (BMI) of 30.0 to 30.9 in adult, unspecified obesity type Keni is currently in the action stage of change. As such, her goal is to continue with weight loss efforts. She has agreed to the Category 1 Plan + 100 calories.   Exercise goals: As is.  Behavioral modification strategies: increasing lean protein intake, meal planning and cooking strategies, keeping healthy foods in the home and planning for success.  Myria has agreed to follow-up with our clinic in 2 weeks. She was informed of the importance of frequent follow-up visits to maximize her success with intensive lifestyle modifications for her multiple health conditions.   Objective:   Blood pressure (!) 149/75, pulse 66, temperature 98.4 F (36.9 C), height 5\' 3"  (1.6 m), weight 163 lb (73.9 kg), SpO2 98 %. Body mass index is 28.87 kg/m.  General: Cooperative, alert, well developed, in no acute distress. HEENT: Conjunctivae and lids unremarkable. Cardiovascular: Regular rhythm.  Lungs: Normal work of breathing. Neurologic: No focal deficits.   Lab Results  Component Value Date   CREATININE 0.84 04/28/2020   BUN 13 04/28/2020   NA 139 04/28/2020   K 4.5 04/28/2020   CL 101 04/28/2020   CO2 27 04/28/2020   Lab Results  Component Value Date   ALT 29 04/28/2020   AST 21 04/28/2020   ALKPHOS 81 04/28/2020  BILITOT 0.3 04/28/2020   Lab Results  Component Value Date   HGBA1C 5.7 (H) 04/28/2020   Lab Results  Component Value Date   INSULIN 7.6 04/28/2020   Lab Results  Component Value Date   TSH 1.560 04/28/2020   Lab Results  Component Value Date   CHOL 238 (H) 04/28/2020   HDL 58 04/28/2020    LDLCALC 165 (H) 04/28/2020   TRIG 88 04/28/2020   Lab Results  Component Value Date   WBC 10.9 (H) 04/14/2020   HGB 12.7 04/14/2020   HCT 39.4 04/14/2020   MCV 92.9 04/14/2020   PLT 256 04/14/2020   No results found for: IRON, TIBC, FERRITIN  Obesity Behavioral Intervention:   Approximately 15 minutes were spent on the discussion below.  ASK: We discussed the diagnosis of obesity with [name] today and Karn agreed to give [place] permission to discuss obesity behavioral modification therapy today.  ASSESS: Talisha has the diagnosis of obesity and her BMI today is 28.88. Genasis is in the action stage of change.   ADVISE: Youa was educated on the multiple health risks of obesity as well as the benefit of weight loss to improve her health. She was advised of the need for long term treatment and the importance of lifestyle modifications to improve her current health and to decrease her risk of future health problems.  AGREE: Multiple dietary modification options and treatment options were discussed and Khia agreed to follow the recommendations documented in the above note.  ARRANGE: Jonnie was educated on the importance of frequent visits to treat obesity as outlined per CMS and USPSTF guidelines and agreed to schedule her next follow up appointment today.  Attestation Statements:   Reviewed by clinician on day of visit: allergies, medications, problem list, medical history, surgical history, family history, social history, and previous encounter notes.   I, Burt Knack, am acting as transcriptionist for Reuben Likes, MD.  I have reviewed the above documentation for accuracy and completeness, and I agree with the above. - Katherina Mires, MD

## 2020-07-06 ENCOUNTER — Other Ambulatory Visit (INDEPENDENT_AMBULATORY_CARE_PROVIDER_SITE_OTHER): Payer: Self-pay | Admitting: Family Medicine

## 2020-07-06 DIAGNOSIS — E559 Vitamin D deficiency, unspecified: Secondary | ICD-10-CM

## 2020-07-08 DIAGNOSIS — J342 Deviated nasal septum: Secondary | ICD-10-CM | POA: Diagnosis not present

## 2020-07-08 DIAGNOSIS — J310 Chronic rhinitis: Secondary | ICD-10-CM | POA: Diagnosis not present

## 2020-07-20 ENCOUNTER — Other Ambulatory Visit: Payer: Self-pay

## 2020-07-20 ENCOUNTER — Ambulatory Visit (INDEPENDENT_AMBULATORY_CARE_PROVIDER_SITE_OTHER): Payer: Medicare PPO | Admitting: Family Medicine

## 2020-07-20 ENCOUNTER — Encounter (INDEPENDENT_AMBULATORY_CARE_PROVIDER_SITE_OTHER): Payer: Self-pay | Admitting: Family Medicine

## 2020-07-20 VITALS — BP 112/65 | HR 77 | Temp 98.2°F | Ht 63.0 in | Wt 160.0 lb

## 2020-07-20 DIAGNOSIS — Z6830 Body mass index (BMI) 30.0-30.9, adult: Secondary | ICD-10-CM | POA: Diagnosis not present

## 2020-07-20 DIAGNOSIS — E669 Obesity, unspecified: Secondary | ICD-10-CM

## 2020-07-20 DIAGNOSIS — E559 Vitamin D deficiency, unspecified: Secondary | ICD-10-CM

## 2020-07-20 DIAGNOSIS — E668 Other obesity: Secondary | ICD-10-CM

## 2020-07-20 DIAGNOSIS — E7849 Other hyperlipidemia: Secondary | ICD-10-CM | POA: Diagnosis not present

## 2020-07-20 MED ORDER — VITAMIN D (ERGOCALCIFEROL) 1.25 MG (50000 UNIT) PO CAPS: 50000.0000 [IU] | ORAL_CAPSULE | ORAL | 0 refills | Status: DC

## 2020-07-21 ENCOUNTER — Other Ambulatory Visit (INDEPENDENT_AMBULATORY_CARE_PROVIDER_SITE_OTHER): Payer: Self-pay | Admitting: Family Medicine

## 2020-07-21 DIAGNOSIS — E7849 Other hyperlipidemia: Secondary | ICD-10-CM

## 2020-07-21 NOTE — Progress Notes (Signed)
Chief Complaint:   OBESITY Maureen Elliott is here to discuss her progress with her obesity treatment plan along with follow-up of her obesity related diagnoses. Maureen Elliott is on the Category 1 Plan + 100 calories and states she is following her eating plan approximately 75% of the time. Maureen Elliott states she is water aerobics and silver sneakers for 45 minutes 5 times per week.  Today's visit was #: 6 Starting weight: 171 lbs Starting date: 04/28/2020 Today's weight: 160 lbs Today's date: 07/20/2020 Total lbs lost to date: 11 Total lbs lost since last in-office visit: 3  Interim History: Maureen Elliott went away for the weekend and had to make the most nutritious choices she could while away. Getting hungry now a couple of hours after eating. No obstacles in the next 2 weeks. She is wondering about incorporating pasta into the plan.  Subjective:   1. Vitamin D deficiency Maureen Elliott denies nausea, vomiting, or muscle weakness, but notes fatigue. Last Vit D level was 31.7.  2. Other hyperlipidemia Maureen Elliott's last LDL was 165, HDL 58, and triglycerides 88. She is on statin.  Assessment/Plan:   1. Vitamin D deficiency Low Vitamin D level contributes to fatigue and are associated with obesity, breast, and colon cancer. We will refill prescription Vitamin D for 1 month. Maureen Elliott will follow-up for routine testing of Vitamin D, at least 2-3 times per year to avoid over-replacement.  - Vitamin D, Ergocalciferol, (DRISDOL) 1.25 MG (50000 UNIT) CAPS capsule; Take 1 capsule (50,000 Units total) by mouth every 7 (seven) days.  Dispense: 4 capsule; Refill: 0  2. Other hyperlipidemia Cardiovascular risk and specific lipid/LDL goals reviewed. We discussed several lifestyle modifications today and Maureen Elliott will continue to work on diet, exercise and weight loss efforts. We will repeat labs at her next appointment. Orders and follow up as documented in patient record.   Counseling Intensive lifestyle modifications are the first line  treatment for this issue. . Dietary changes: Increase soluble fiber. Decrease simple carbohydrates. . Exercise changes: Moderate to vigorous-intensity aerobic activity 150 minutes per week if tolerated. . Lipid-lowering medications: see documented in medical record.  3. Class 1 obesity with serious comorbidity and body mass index (BMI) of 30.0 to 30.9 in adult, unspecified obesity type Maureen Elliott is currently in the action stage of change. As such, her goal is to continue with weight loss efforts. She has agreed to the Category 2 Plan.   We will repeat fasting labs at her next appointment.  Exercise goals: As is.  Behavioral modification strategies: increasing lean protein intake, increasing vegetables and meal planning and cooking strategies.  Maureen Elliott has agreed to follow-up with our clinic in 2 to 3 weeks. She was informed of the importance of frequent follow-up visits to maximize her success with intensive lifestyle modifications for her multiple health conditions.   Objective:   Blood pressure 112/65, pulse 77, temperature 98.2 F (36.8 C), temperature source Oral, height 5\' 3"  (1.6 m), weight 160 lb (72.6 kg), SpO2 94 %. Body mass index is 28.34 kg/m.  General: Cooperative, alert, well developed, in no acute distress. HEENT: Conjunctivae and lids unremarkable. Cardiovascular: Regular rhythm.  Lungs: Normal work of breathing. Neurologic: No focal deficits.   Lab Results  Component Value Date   CREATININE 0.84 04/28/2020   BUN 13 04/28/2020   NA 139 04/28/2020   K 4.5 04/28/2020   CL 101 04/28/2020   CO2 27 04/28/2020   Lab Results  Component Value Date   ALT 29 04/28/2020  AST 21 04/28/2020   ALKPHOS 81 04/28/2020   BILITOT 0.3 04/28/2020   Lab Results  Component Value Date   HGBA1C 5.7 (H) 04/28/2020   Lab Results  Component Value Date   INSULIN 7.6 04/28/2020   Lab Results  Component Value Date   TSH 1.560 04/28/2020   Lab Results  Component Value Date    CHOL 238 (H) 04/28/2020   HDL 58 04/28/2020   LDLCALC 165 (H) 04/28/2020   TRIG 88 04/28/2020   Lab Results  Component Value Date   WBC 10.9 (H) 04/14/2020   HGB 12.7 04/14/2020   HCT 39.4 04/14/2020   MCV 92.9 04/14/2020   PLT 256 04/14/2020   No results found for: IRON, TIBC, FERRITIN  Obesity Behavioral Intervention:   Approximately 15 minutes were spent on the discussion below.  ASK: We discussed the diagnosis of obesity with [name] today and Lajuanna agreed to give [place] permission to discuss obesity behavioral modification therapy today.  ASSESS: Amiliana has the diagnosis of obesity and her BMI today is 28.35. Mahsa is in the action stage of change.   ADVISE: Omunique was educated on the multiple health risks of obesity as well as the benefit of weight loss to improve her health. She was advised of the need for long term treatment and the importance of lifestyle modifications to improve her current health and to decrease her risk of future health problems.  AGREE: Multiple dietary modification options and treatment options were discussed and Ilena agreed to follow the recommendations documented in the above note.  ARRANGE: Mailey was educated on the importance of frequent visits to treat obesity as outlined per CMS and USPSTF guidelines and agreed to schedule her next follow up appointment today.  Attestation Statements:   Reviewed by clinician on day of visit: allergies, medications, problem list, medical history, surgical history, family history, social history, and previous encounter notes.   I, Burt Knack, am acting as transcriptionist for Reuben Likes, MD. I have reviewed the above documentation for accuracy and completeness, and I agree with the above. - Katherina Mires, MD

## 2020-07-26 DIAGNOSIS — B078 Other viral warts: Secondary | ICD-10-CM | POA: Diagnosis not present

## 2020-07-30 IMAGING — MR MR HEAD WO/W CM
11 of 12 series · 38 of 48 positions shown · IV contrast (18ml multihance)
Comparison: Head CT 02/11/2018 and MRI 12/09/2016

CLINICAL DATA: Right sensorineural hearing loss for 10 months.
Right eye pressure for 6 weeks.

Creatinine was obtained on site at [HOSPITAL] at [HOSPITAL].
Results: Creatinine 0.9 mg/dL.
EXAM:
MRI HEAD WITHOUT AND WITH CONTRAST
TECHNIQUE: Multiplanar, multiecho pulse sequences of the brain and surrounding
structures were obtained without and with intravenous contrast.
CONTRAST:  17mL MULTIHANCE GADOBENATE DIMEGLUMINE 529 MG/ML IV SOLN

[Series 3: T1 · sagittal · 5.0mm · 0.45mm/px · 3 of 21 slices shown (1 of 3)]
[im 1/21]
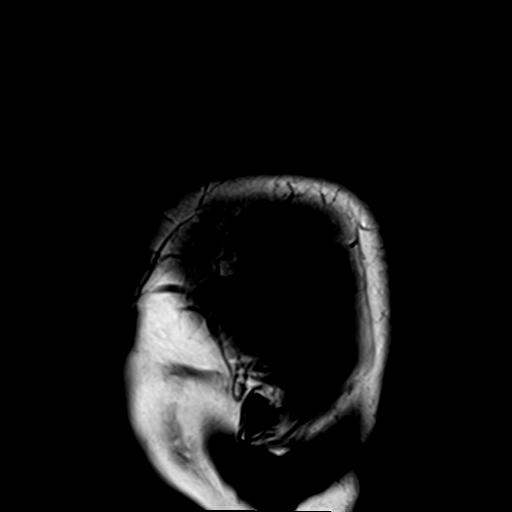
[im 11/21]
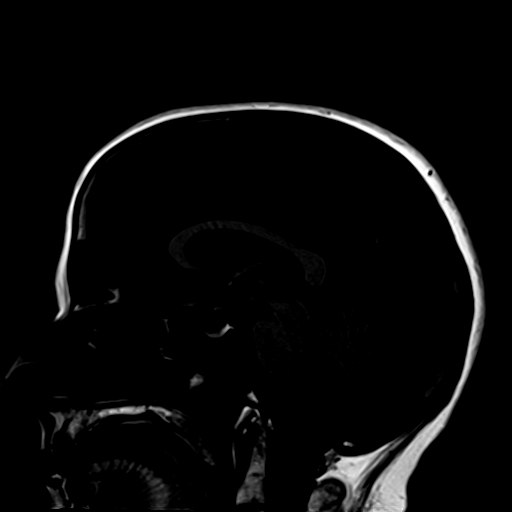
[im 21/21]
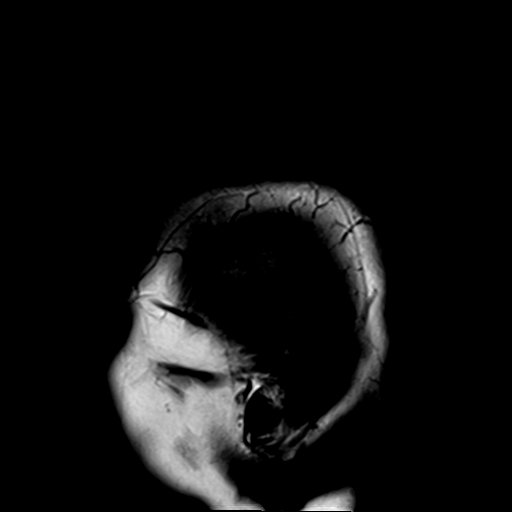

[Series 4: DWI · axial · 3.0mm · 1.80mm/px · z∈[-44,+96]mm · 11 of 96 slices shown (1 of 2)]
[im 1/96]
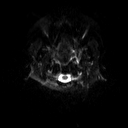
[im 10/96]
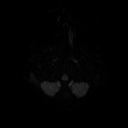
[im 20/96]
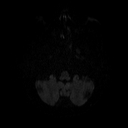
[im 29/96]
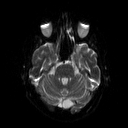
[im 39/96]
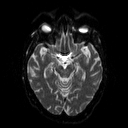
[im 48/96]
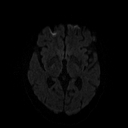
[im 58/96]
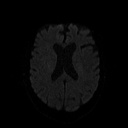
[im 67/96]
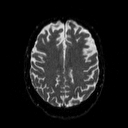
[im 77/96]
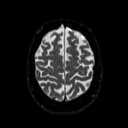
[im 86/96]
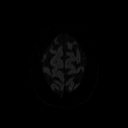
[im 96/96]
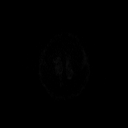

[Series 5: DWI · axial · 3.0mm · 1.80mm/px · z∈[-44,+96]mm · 6 of 48 slices shown (2 of 2)]
[im 1/48]
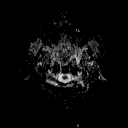
[im 10/48]
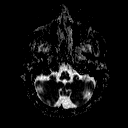
[im 19/48]
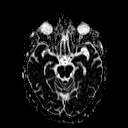
[im 29/48]
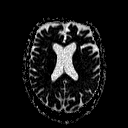
[im 38/48]
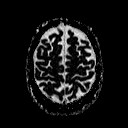
[im 48/48]
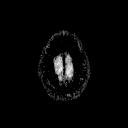

[Series 6: T2 · axial · 5.0mm · 0.45mm/px · z∈[-44,+97]mm · 3 of 23 slices shown]
[im 1/23]
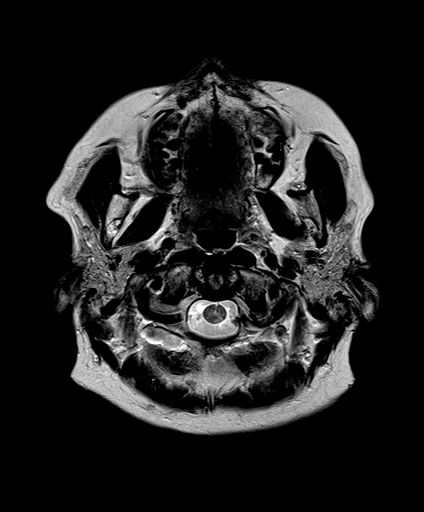
[im 12/23]
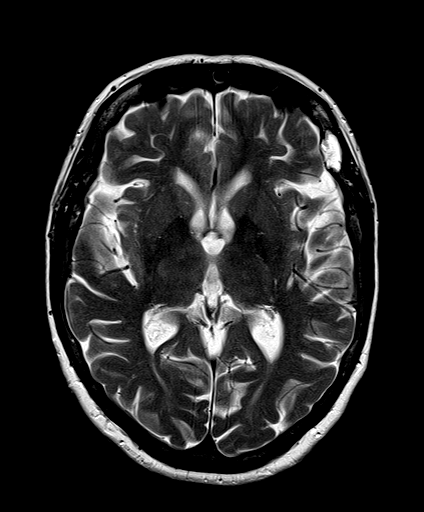
[im 23/23]
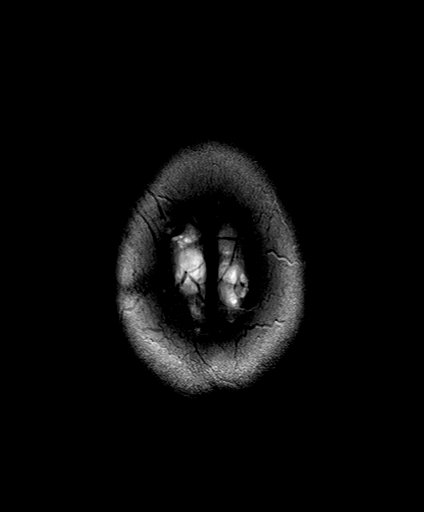

[Series 7: FLAIR · axial · 3.0mm · 0.45mm/px · z∈[-46,+98]mm · 3 of 30 slices shown]
[im 1/30]
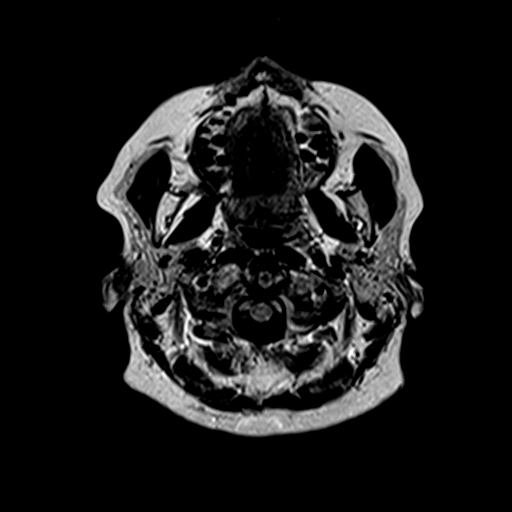
[im 15/30]
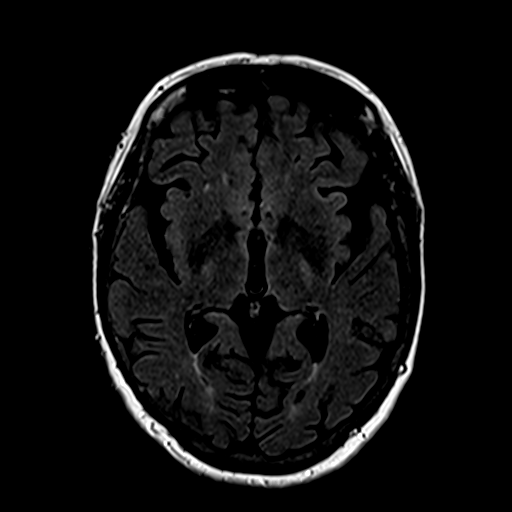
[im 30/30]
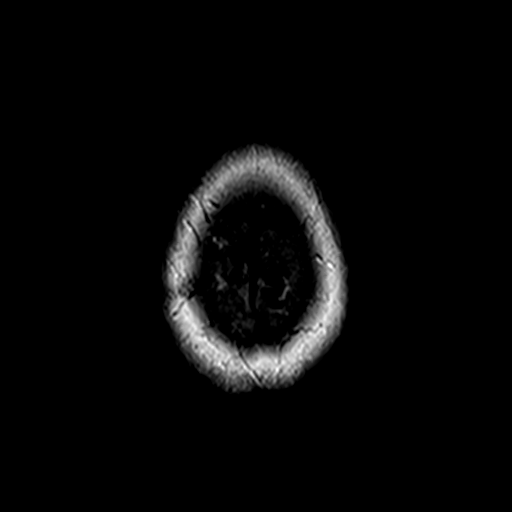

[Series 8: T1 · coronal · 3.0mm · 0.35mm/px · 1 of 11 slices shown (2 of 3)]
[im 1/11]
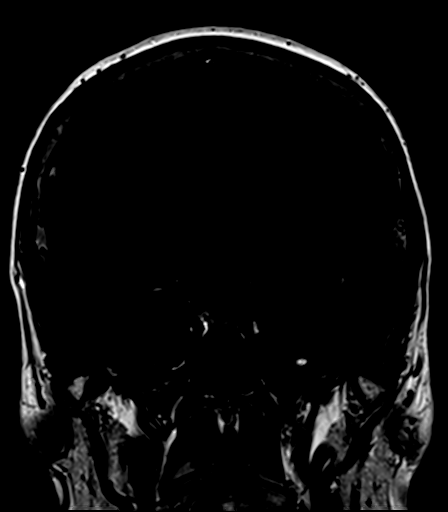

[Series 10: swi_images · axial · 4.0mm · 0.90mm/px · z∈[-51,+104]mm · 5 of 40 slices shown]
[im 1/40]
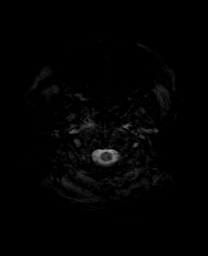
[im 10/40]
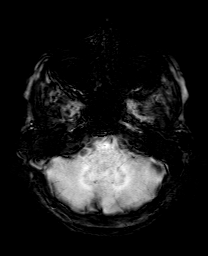
[im 20/40]
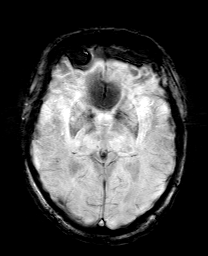
[im 30/40]
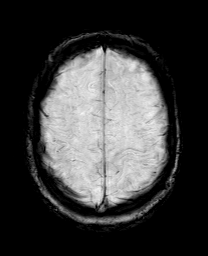
[im 40/40]
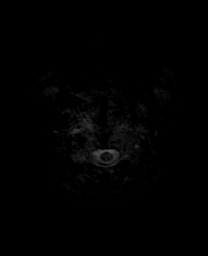

[Series 11: T1 · axial · 3.0mm · 0.35mm/px · 1 of 11 slices shown (3 of 3)]
[im 1/11]
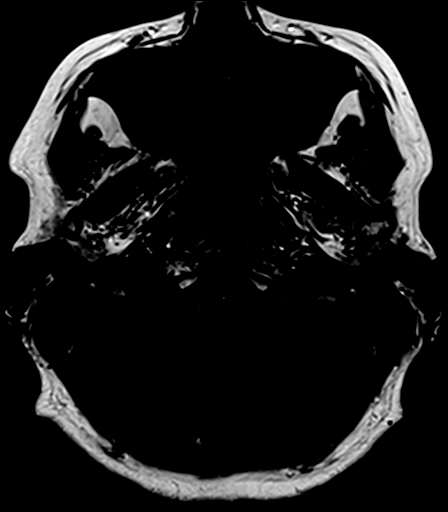

[Series 12: bSSFP · axial · 1.0mm · 0.28mm/px · z∈[-31,-8]mm · 3 of 36 slices shown]
[im 1/36]
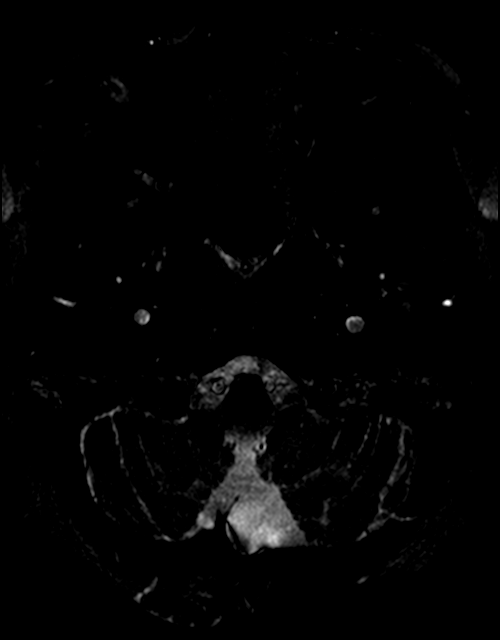
[im 12/36]
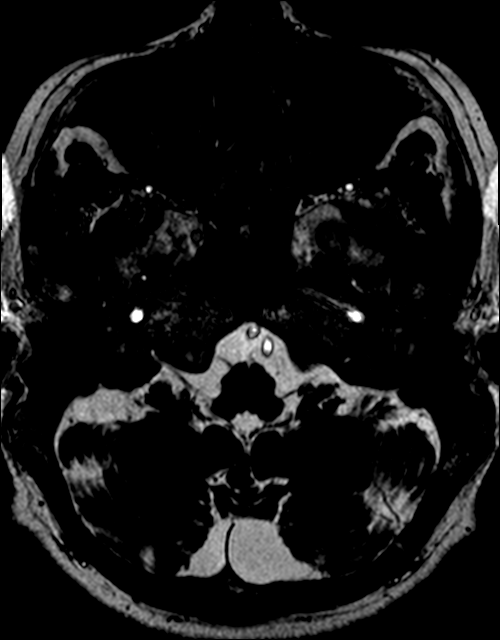
[im 24/36]
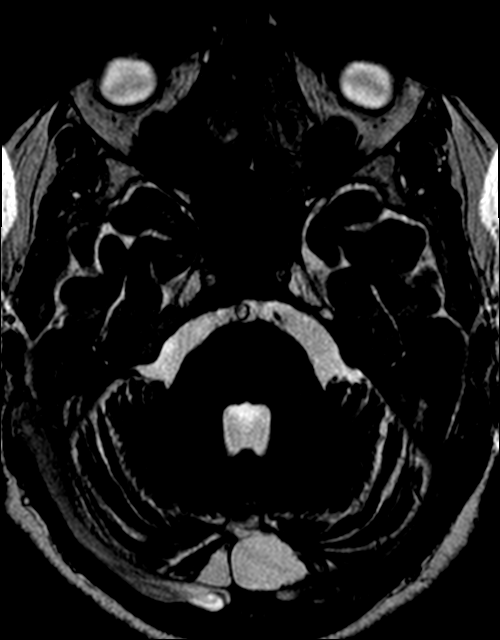

[Series 13: T1 post-contrast · coronal · 3.0mm · 0.35mm/px · 1 of 11 slices shown (1 of 2)]
[im 1/11]
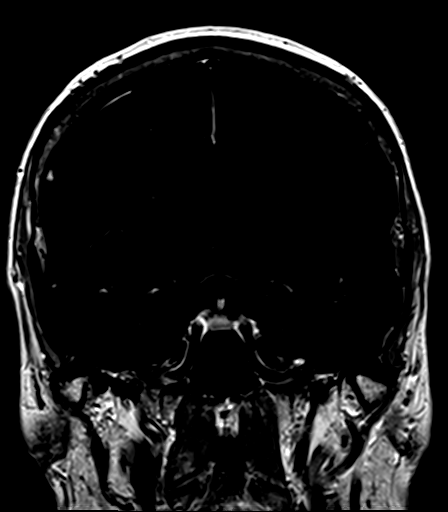

[Series 14: T1 post-contrast · axial · 3.0mm · 0.35mm/px · 1 of 11 slices shown (2 of 2)]
[im 1/11]
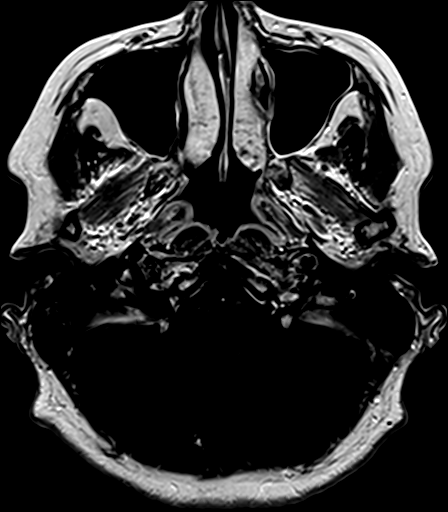

[38 of 48 positions shown; findings below may reference images not displayed]

FINDINGS: Brain: There is no evidence of acute infarct, intracranial
hemorrhage, mass, midline shift, or extra-axial fluid collection.
There is mild cerebral atrophy. Scattered small foci of T2
hyperintensity in the cerebral white matter bilaterally are
unchanged from the prior MRI and nonspecific but compatible with
minimal chronic small vessel ischemic disease. No abnormal
enhancement is identified. A mega cisterna magna is noted, a normal
variant.

Dedicated imaging through the internal auditory canals demonstrates
a normal course of cranial nerves VII and VIII without evidence of
mass or abnormal enhancement. Inner ear structures demonstrate
normal signal bilaterally. No mass is seen within the
cerebellopontine angles.

Vascular: Major intracranial vascular flow voids are preserved.

Skull and upper cervical spine: Unchanged 1.8 cm T2 hyperintense
nonenhancing lesion involving the lateral left frontal calvarium,
likely a cyst or other benign etiology.

Sinuses/Orbits: Bilateral cataract extraction. Mild right and
moderate left ethmoid air cell mucosal thickening. Small right
mastoid effusion, chronic.

Other: None.
IMPRESSION: 1. Negative internal auditory canal imaging. No retrocochlear
lesion.
2. Unchanged and largely unremarkable appearance of the brain for
age. No acute intracranial abnormality.

## 2020-08-05 ENCOUNTER — Ambulatory Visit (INDEPENDENT_AMBULATORY_CARE_PROVIDER_SITE_OTHER): Payer: Medicare PPO | Admitting: Family Medicine

## 2020-08-05 ENCOUNTER — Other Ambulatory Visit: Payer: Self-pay

## 2020-08-05 ENCOUNTER — Encounter (INDEPENDENT_AMBULATORY_CARE_PROVIDER_SITE_OTHER): Payer: Self-pay | Admitting: Family Medicine

## 2020-08-05 VITALS — BP 115/61 | HR 72 | Temp 97.8°F | Ht 63.0 in | Wt 157.0 lb

## 2020-08-05 DIAGNOSIS — E669 Obesity, unspecified: Secondary | ICD-10-CM | POA: Diagnosis not present

## 2020-08-05 DIAGNOSIS — Z6830 Body mass index (BMI) 30.0-30.9, adult: Secondary | ICD-10-CM | POA: Diagnosis not present

## 2020-08-05 DIAGNOSIS — R7303 Prediabetes: Secondary | ICD-10-CM | POA: Diagnosis not present

## 2020-08-05 DIAGNOSIS — E559 Vitamin D deficiency, unspecified: Secondary | ICD-10-CM | POA: Diagnosis not present

## 2020-08-05 DIAGNOSIS — E7849 Other hyperlipidemia: Secondary | ICD-10-CM | POA: Diagnosis not present

## 2020-08-05 MED ORDER — ATORVASTATIN CALCIUM 10 MG PO TABS: 10.0000 mg | ORAL_TABLET | Freq: Every day | ORAL | 0 refills | Status: DC

## 2020-08-05 MED ORDER — VITAMIN D (ERGOCALCIFEROL) 1.25 MG (50000 UNIT) PO CAPS: 50000.0000 [IU] | ORAL_CAPSULE | ORAL | 0 refills | Status: DC

## 2020-08-06 LAB — VITAMIN D 25 HYDROXY (VIT D DEFICIENCY, FRACTURES): Vit D, 25-Hydroxy: 65 ng/mL (ref 30.0–100.0)

## 2020-08-06 LAB — LIPID PANEL WITH LDL/HDL RATIO
Cholesterol, Total: 155 mg/dL (ref 100–199)
HDL: 55 mg/dL (ref >39)
LDL Chol Calc (NIH): 88 mg/dL (ref 0–99)
LDL/HDL Ratio: 1.6 ratio (ref 0.0–3.2)
Triglycerides: 61 mg/dL (ref 0–149)
VLDL Cholesterol Cal: 12 mg/dL (ref 5–40)

## 2020-08-06 LAB — HEMOGLOBIN A1C
Est. average glucose Bld gHb Est-mCnc: 120 mg/dL
Hgb A1c MFr Bld: 5.8 % — ABNORMAL HIGH (ref 4.8–5.6)

## 2020-08-06 LAB — INSULIN, RANDOM: INSULIN: 5.1 u[IU]/mL (ref 2.6–24.9)

## 2020-08-09 NOTE — Progress Notes (Signed)
Chief Complaint:   OBESITY Maureen Elliott is here to discuss her progress with her obesity treatment plan along with follow-up of her obesity related diagnoses. Maureen Elliott is on the Category 2 Plan and states she is following her eating plan approximately 100% of the time. Maureen Elliott states she is at the gym doing water aerobics and silver sneakers for 45 minutes 5 times per week.  Today's visit was #: 7 Starting weight: 171 lbs Starting date: 04/28/2020 Today's weight: 157 lbs Today's date: 08/05/2020 Total lbs lost to date: 14 Total lbs lost since last in-office visit: 3  Interim History: Maureen Elliott is following the meal plan completely. She hasn't had any sweets since starting the meal plan secondary to concerns over diabetes. For Thanksgiving she is hosting. She is making [place] and then other foods are being brought by other attendees.  Subjective:   1. Vitamin D deficiency Maureen Elliott denies nausea, vomiting, or muscle weakness, but she notes fatigue. She is on prescription Vit D. Last Vit D level was 31.7.  2. Other hyperlipidemia Maureen Elliott is on atorvastatin 10 mg, no LFT elevation and she denies myalgias.  3. Pre-diabetes Maureen Elliott's last A1c was 5.7 and insulin 7.6. She is not on metformin.  Assessment/Plan:   1. Vitamin D deficiency Low Vitamin D level contributes to fatigue and are associated with obesity, breast, and colon cancer. We will check labs today, and we will refill prescription Vitamin D for 1 month. Maureen Elliott will follow-up for routine testing of Vitamin D, at least 2-3 times per year to avoid over-replacement.  - Vitamin D, Ergocalciferol, (DRISDOL) 1.25 MG (50000 UNIT) CAPS capsule; Take 1 capsule (50,000 Units total) by mouth every 7 (seven) days.  Dispense: 4 capsule; Refill: 0 - VITAMIN D 25 Hydroxy (Vit-D Deficiency, Fractures)  2. Other hyperlipidemia Cardiovascular risk and specific lipid/LDL goals reviewed. We discussed several lifestyle modifications today and Maureen Elliott will continue to  work on diet, exercise and weight loss efforts. We will check labs today, and we will refill atorvastatin for 1 month. Orders and follow up as documented in patient record.   Counseling Intensive lifestyle modifications are the first line treatment for this issue.  Dietary changes: Increase soluble fiber. Decrease simple carbohydrates.  Exercise changes: Moderate to vigorous-intensity aerobic activity 150 minutes per week if tolerated.  Lipid-lowering medications: see documented in medical record.  - atorvastatin (LIPITOR) 10 MG tablet; Take 1 tablet (10 mg total) by mouth daily.  Dispense: 30 tablet; Refill: 0 - Lipid Panel With LDL/HDL Ratio  3. Pre-diabetes Maureen Elliott will continue to work on weight loss, exercise, and decreasing simple carbohydrates to help decrease the risk of diabetes. We will check labs today.  - Hemoglobin A1c - Insulin, random  4. Class 1 obesity with serious comorbidity and body mass index (BMI) of 30.0 to 30.9 in adult, unspecified obesity type Maureen Elliott is currently in the action stage of change. As such, her goal is to continue with weight loss efforts. She has agreed to the Category 2 Plan.   Exercise goals: As is.  Behavioral modification strategies: increasing lean protein intake, meal planning and cooking strategies, keeping healthy foods in the home and planning for success.  Maureen Elliott has agreed to follow-up with our clinic in 3 to 4 weeks. She was informed of the importance of frequent follow-up visits to maximize her success with intensive lifestyle modifications for her multiple health conditions.   Maureen Elliott was informed we would discuss her lab results at her next visit unless there is  a critical issue that needs to be addressed sooner. Maureen Elliott agreed to keep her next visit at the agreed upon time to discuss these results.  Objective:   Blood pressure 115/61, pulse 72, temperature 97.8 F (36.6 C), temperature source Oral, height 5\' 3"  (1.6 m), weight 157 lb  (71.2 kg), SpO2 97 %. Body mass index is 27.81 kg/m.  General: Cooperative, alert, well developed, in no acute distress. HEENT: Conjunctivae and lids unremarkable. Cardiovascular: Regular rhythm.  Lungs: Normal work of breathing. Neurologic: No focal deficits.   Lab Results  Component Value Date   CREATININE 0.84 04/28/2020   BUN 13 04/28/2020   NA 139 04/28/2020   K 4.5 04/28/2020   CL 101 04/28/2020   CO2 27 04/28/2020   Lab Results  Component Value Date   ALT 29 04/28/2020   AST 21 04/28/2020   ALKPHOS 81 04/28/2020   BILITOT 0.3 04/28/2020   Lab Results  Component Value Date   HGBA1C 5.8 (H) 08/05/2020   HGBA1C 5.7 (H) 04/28/2020   Lab Results  Component Value Date   INSULIN 5.1 08/05/2020   INSULIN 7.6 04/28/2020   Lab Results  Component Value Date   TSH 1.560 04/28/2020   Lab Results  Component Value Date   CHOL 155 08/05/2020   HDL 55 08/05/2020   LDLCALC 88 08/05/2020   TRIG 61 08/05/2020   Lab Results  Component Value Date   WBC 10.9 (H) 04/14/2020   HGB 12.7 04/14/2020   HCT 39.4 04/14/2020   MCV 92.9 04/14/2020   PLT 256 04/14/2020   No results found for: IRON, TIBC, FERRITIN  Obesity Behavioral Intervention:   Approximately 15 minutes were spent on the discussion below.  ASK: We discussed the diagnosis of obesity with 04/16/2020 today and [place] agreed to give [name] permission to discuss obesity behavioral modification therapy today.  ASSESS: Brooklen has the diagnosis of obesity and her BMI today is 27.82. Akiyah is in the action stage of change.   ADVISE: Nychelle was educated on the multiple health risks of obesity as well as the benefit of weight loss to improve her health. She was advised of the need for long term treatment and the importance of lifestyle modifications to improve her current health and to decrease her risk of future health problems.  AGREE: Multiple dietary modification options and treatment options were discussed and Lawren  agreed to follow the recommendations documented in the above note.  ARRANGE: Ahlani was educated on the importance of frequent visits to treat obesity as outlined per CMS and USPSTF guidelines and agreed to schedule her next follow up appointment today.  Attestation Statements:   Reviewed by clinician on day of visit: allergies, medications, problem list, medical history, surgical history, family history, social history, and previous encounter notes.   I, [name], am acting as transcriptionist for Burt Knack, MD.  I have reviewed the above documentation for accuracy and completeness, and I agree with the above. - Reuben Likes, MD

## 2020-08-26 ENCOUNTER — Other Ambulatory Visit: Payer: Self-pay

## 2020-08-26 ENCOUNTER — Ambulatory Visit (INDEPENDENT_AMBULATORY_CARE_PROVIDER_SITE_OTHER): Payer: Medicare PPO | Admitting: Family Medicine

## 2020-08-26 ENCOUNTER — Encounter (INDEPENDENT_AMBULATORY_CARE_PROVIDER_SITE_OTHER): Payer: Self-pay | Admitting: Family Medicine

## 2020-08-26 VITALS — BP 110/67 | HR 69 | Temp 98.1°F | Ht 63.0 in | Wt 156.0 lb

## 2020-08-26 DIAGNOSIS — E7849 Other hyperlipidemia: Secondary | ICD-10-CM

## 2020-08-26 DIAGNOSIS — R7303 Prediabetes: Secondary | ICD-10-CM | POA: Diagnosis not present

## 2020-08-26 DIAGNOSIS — Z6830 Body mass index (BMI) 30.0-30.9, adult: Secondary | ICD-10-CM | POA: Diagnosis not present

## 2020-08-26 DIAGNOSIS — E669 Obesity, unspecified: Secondary | ICD-10-CM | POA: Diagnosis not present

## 2020-08-26 DIAGNOSIS — E559 Vitamin D deficiency, unspecified: Secondary | ICD-10-CM

## 2020-08-26 MED ORDER — VITAMIN D (ERGOCALCIFEROL) 1.25 MG (50000 UNIT) PO CAPS: 50000.0000 [IU] | ORAL_CAPSULE | ORAL | 0 refills | Status: DC

## 2020-08-26 MED ORDER — ATORVASTATIN CALCIUM 10 MG PO TABS: 10.0000 mg | ORAL_TABLET | Freq: Every day | ORAL | 0 refills | Status: DC

## 2020-08-28 ENCOUNTER — Other Ambulatory Visit (INDEPENDENT_AMBULATORY_CARE_PROVIDER_SITE_OTHER): Payer: Self-pay | Admitting: Family Medicine

## 2020-08-28 DIAGNOSIS — E7849 Other hyperlipidemia: Secondary | ICD-10-CM

## 2020-08-31 NOTE — Progress Notes (Signed)
Chief Complaint:   OBESITY Maureen Elliott is here to discuss her progress with her obesity treatment plan along with follow-up of her obesity related diagnoses. Maureen Elliott is on the Category 2 Plan and states she is following her eating plan approximately 90% of the time. Maureen Elliott states she is doing water aerobics or silver sneakers for 45 minutes 5 times per week.  Today's visit was #: 8 Starting weight: 171 lbs Starting date: 04/28/2020 Today's weight: 156 lbs Today's date: 08/26/2020 Total lbs lost to date: 15 Total lbs lost since last in-office visit: 1  Interim History: Maureen Elliott continues to do well with weight loss despite having extra temptations over Thanksgiving. She is exercising regularly and trying to increase her strengthening exercises. Her hunger is controlled and she is happy with her plan. Her husband is doing well supporting her healthy lifestyle.  Subjective:   1. Other hyperlipidemia Maureen Elliott is doing well on her medications and diet. Her LDL has improved nicely. I discussed labs with the patient today.  2. Vitamin D deficiency Maureen Elliott is stable on Vit D, and she denies nausea, vomiting, or muscle weakness. I discussed labs with the patient today.  3. Pre-diabetes Maureen Elliott's A1c has increased very slightly but her fasting insulin has improved. She understands the importance of avoiding diabetes mellitus, and she is doing well with diet and weight loss. She notes decreased polyphagia. I discussed labs with the patient today.  Assessment/Plan:   1. Other hyperlipidemia Cardiovascular risk and specific lipid/LDL goals reviewed. We discussed several lifestyle modifications today. Maureen Elliott will continue to work on diet, exercise and weight loss efforts. We will refill Lipitor for 1 month. Orders and follow up as documented in patient record.   - atorvastatin (LIPITOR) 10 MG tablet; Take 1 tablet (10 mg total) by mouth daily.  Dispense: 30 tablet; Refill: 0  2. Vitamin D deficiency Low Vitamin  D level contributes to fatigue and are associated with obesity, breast, and colon cancer. We will refill prescription Vitamin D for 1 month. Maureen Elliott will follow-up for routine testing of Vitamin D, at least 2-3 times per year to avoid over-replacement.  - Vitamin D, Ergocalciferol, (DRISDOL) 1.25 MG (50000 UNIT) CAPS capsule; Take 1 capsule (50,000 Units total) by mouth every 7 (seven) days.  Dispense: 4 capsule; Refill: 0  3. Pre-diabetes Maureen Elliott will continue to work on weight loss, diet, exercise, and decreasing simple carbohydrates to help decrease the risk of diabetes. We will recheck labs in 1-2 months.  4. Class 1 obesity with serious comorbidity and body mass index (BMI) of 30.0 to 30.9 in adult, unspecified obesity type Maureen Elliott is currently in the action stage of change. As such, her goal is to continue with weight loss efforts. She has agreed to the Category 2 Plan.   Exercise goals: As is.  Behavioral modification strategies: holiday eating strategies .  Maureen Elliott has agreed to follow-up with our clinic in 2 to 3 weeks. She was informed of the importance of frequent follow-up visits to maximize her success with intensive lifestyle modifications for her multiple health conditions.   Objective:   Blood pressure 110/67, pulse 69, temperature 98.1 F (36.7 C), height 5\' 3"  (1.6 m), weight 156 lb (70.8 kg), SpO2 97 %. Body mass index is 27.63 kg/m.  General: Cooperative, alert, well developed, in no acute distress. HEENT: Conjunctivae and lids unremarkable. Cardiovascular: Regular rhythm.  Lungs: Normal work of breathing. Neurologic: No focal deficits.   Lab Results  Component Value Date   CREATININE  0.84 04/28/2020   BUN 13 04/28/2020   NA 139 04/28/2020   K 4.5 04/28/2020   CL 101 04/28/2020   CO2 27 04/28/2020   Lab Results  Component Value Date   ALT 29 04/28/2020   AST 21 04/28/2020   ALKPHOS 81 04/28/2020   BILITOT 0.3 04/28/2020   Lab Results  Component Value Date    HGBA1C 5.8 (H) 08/05/2020   HGBA1C 5.7 (H) 04/28/2020   Lab Results  Component Value Date   INSULIN 5.1 08/05/2020   INSULIN 7.6 04/28/2020   Lab Results  Component Value Date   TSH 1.560 04/28/2020   Lab Results  Component Value Date   CHOL 155 08/05/2020   HDL 55 08/05/2020   LDLCALC 88 08/05/2020   TRIG 61 08/05/2020   Lab Results  Component Value Date   WBC 10.9 (H) 04/14/2020   HGB 12.7 04/14/2020   HCT 39.4 04/14/2020   MCV 92.9 04/14/2020   PLT 256 04/14/2020   No results found for: IRON, TIBC, FERRITIN  Obesity Behavioral Intervention:   Approximately 15 minutes were spent on the discussion below.  ASK: We discussed the diagnosis of obesity with Maureen Elliott today and Maureen Elliott agreed to give [place] permission to discuss obesity behavioral modification therapy today.  ASSESS: Maureen Elliott has the diagnosis of obesity and her BMI today is 27.64. Maureen Elliott is in the action stage of change.   ADVISE: Maureen Elliott was educated on the multiple health risks of obesity as well as the benefit of weight loss to improve her health. She was advised of the need for long term treatment and the importance of lifestyle modifications to improve her current health and to decrease her risk of future health problems.  AGREE: Multiple dietary modification options and treatment options were discussed and Maureen Elliott agreed to follow the recommendations documented in the above note.  ARRANGE: Maureen Elliott was educated on the importance of frequent visits to treat obesity as outlined per CMS and USPSTF guidelines and agreed to schedule her next follow up appointment today.  Attestation Statements:   Reviewed by clinician on day of visit: allergies, medications, problem list, medical history, surgical history, family history, social history, and previous encounter notes.   I, Burt Knack, am acting as transcriptionist for Quillian Quince, MD.  I have reviewed the above documentation for accuracy and completeness, and I  agree with the above. -  Quillian Quince, MD

## 2020-09-10 ENCOUNTER — Other Ambulatory Visit (HOSPITAL_BASED_OUTPATIENT_CLINIC_OR_DEPARTMENT_OTHER): Payer: Self-pay | Admitting: Family Medicine

## 2020-09-10 DIAGNOSIS — Z0000 Encounter for general adult medical examination without abnormal findings: Secondary | ICD-10-CM | POA: Diagnosis not present

## 2020-09-10 DIAGNOSIS — N390 Urinary tract infection, site not specified: Secondary | ICD-10-CM | POA: Diagnosis not present

## 2020-09-10 DIAGNOSIS — H401130 Primary open-angle glaucoma, bilateral, stage unspecified: Secondary | ICD-10-CM | POA: Diagnosis not present

## 2020-09-10 DIAGNOSIS — Z6827 Body mass index (BMI) 27.0-27.9, adult: Secondary | ICD-10-CM | POA: Diagnosis not present

## 2020-09-10 DIAGNOSIS — M8580 Other specified disorders of bone density and structure, unspecified site: Secondary | ICD-10-CM | POA: Diagnosis not present

## 2020-09-10 DIAGNOSIS — E7800 Pure hypercholesterolemia, unspecified: Secondary | ICD-10-CM | POA: Diagnosis not present

## 2020-09-10 DIAGNOSIS — E663 Overweight: Secondary | ICD-10-CM | POA: Diagnosis not present

## 2020-09-10 DIAGNOSIS — E042 Nontoxic multinodular goiter: Secondary | ICD-10-CM | POA: Diagnosis not present

## 2020-09-10 DIAGNOSIS — Z86718 Personal history of other venous thrombosis and embolism: Secondary | ICD-10-CM | POA: Diagnosis not present

## 2020-09-10 DIAGNOSIS — M7989 Other specified soft tissue disorders: Secondary | ICD-10-CM

## 2020-09-13 DIAGNOSIS — H401113 Primary open-angle glaucoma, right eye, severe stage: Secondary | ICD-10-CM | POA: Diagnosis not present

## 2020-09-13 DIAGNOSIS — H401121 Primary open-angle glaucoma, left eye, mild stage: Secondary | ICD-10-CM | POA: Diagnosis not present

## 2020-09-14 ENCOUNTER — Encounter (INDEPENDENT_AMBULATORY_CARE_PROVIDER_SITE_OTHER): Payer: Self-pay | Admitting: Family Medicine

## 2020-09-14 ENCOUNTER — Other Ambulatory Visit: Payer: Self-pay

## 2020-09-14 ENCOUNTER — Telehealth (INDEPENDENT_AMBULATORY_CARE_PROVIDER_SITE_OTHER): Payer: Self-pay | Admitting: Family Medicine

## 2020-09-14 ENCOUNTER — Ambulatory Visit (INDEPENDENT_AMBULATORY_CARE_PROVIDER_SITE_OTHER): Payer: Medicare PPO | Admitting: Family Medicine

## 2020-09-14 VITALS — BP 114/72 | HR 83 | Temp 97.7°F | Ht 63.0 in | Wt 152.0 lb

## 2020-09-14 DIAGNOSIS — E559 Vitamin D deficiency, unspecified: Secondary | ICD-10-CM

## 2020-09-14 DIAGNOSIS — E7849 Other hyperlipidemia: Secondary | ICD-10-CM | POA: Diagnosis not present

## 2020-09-14 DIAGNOSIS — Z6830 Body mass index (BMI) 30.0-30.9, adult: Secondary | ICD-10-CM | POA: Diagnosis not present

## 2020-09-14 DIAGNOSIS — E669 Obesity, unspecified: Secondary | ICD-10-CM

## 2020-09-14 MED ORDER — ATORVASTATIN CALCIUM 10 MG PO TABS: 10.0000 mg | ORAL_TABLET | Freq: Every day | ORAL | 0 refills | Status: DC

## 2020-09-14 NOTE — Telephone Encounter (Signed)
Noted. This has been added to pt's med list.

## 2020-09-14 NOTE — Telephone Encounter (Signed)
Latanoprost solution  118mcg/2.5ml  005 percent  The patient wanted to notify of this new medication for her eyes that she was put on yesterday.

## 2020-09-15 ENCOUNTER — Ambulatory Visit (HOSPITAL_BASED_OUTPATIENT_CLINIC_OR_DEPARTMENT_OTHER)
Admission: RE | Admit: 2020-09-15 | Discharge: 2020-09-15 | Disposition: A | Discharge status: Home / Self Care | Payer: Medicare PPO | Source: Ambulatory Visit | Attending: Family Medicine | Admitting: Family Medicine

## 2020-09-15 DIAGNOSIS — M7989 Other specified soft tissue disorders: Secondary | ICD-10-CM | POA: Insufficient documentation

## 2020-09-15 DIAGNOSIS — D1722 Benign lipomatous neoplasm of skin and subcutaneous tissue of left arm: Secondary | ICD-10-CM | POA: Diagnosis not present

## 2020-09-15 DIAGNOSIS — R2232 Localized swelling, mass and lump, left upper limb: Secondary | ICD-10-CM | POA: Diagnosis not present

## 2020-09-15 NOTE — Progress Notes (Signed)
Chief Complaint:   OBESITY Maureen Elliott is here to discuss her progress with her obesity treatment plan along with follow-up of her obesity related diagnoses. Maureen Elliott is on the Category 2 Plan and states she is following her eating plan approximately 95% of the time. Maureen Elliott states she is doing water exercises and silver sneakers for 45 minutes 5 times per week.  Today's visit was #: 9 Starting weight: 171 lbs Starting date: 04/28/2020 Today's weight: 152 lbs Today's date: 09/14/2020 Total lbs lost to date: 19 Total lbs lost since last in-office visit: 4  Interim History: Maureen Elliott has done well with weight loss. She is exercising well with both strengthening and cardio. Her hunger is controlled and she has strategies in place to help decrease holiday weight gain.  Subjective:   1. Other hyperlipidemia Maureen Elliott is stable on Lipitor, and she is doing very well with weight loss, diet, and exercise.  2. Vitamin D deficiency Maureen Elliott is stable on Vit D, and she denies nausea, vomiting, or muscle weakness.  Assessment/Plan:   1. Other hyperlipidemia Cardiovascular risk and specific lipid/LDL goals reviewed. We discussed several lifestyle modifications today. Maureen Elliott will continue to work on diet, exercise and weight loss efforts. We will refill Lipitor for 1 month. Orders and follow up as documented in patient record.   - atorvastatin (LIPITOR) 10 MG tablet; Take 1 tablet (10 mg total) by mouth daily.  Dispense: 30 tablet; Refill: 0  2. Vitamin D deficiency Low Vitamin D level contributes to fatigue and are associated with obesity, breast, and colon cancer. Maureen Elliott agreed to continue taking prescription Vitamin D as is and we will repeat labs next year. She will follow-up for routine testing of Vitamin D, at least 2-3 times per year to avoid over-replacement.  3. Class 1 obesity with serious comorbidity and body mass index (BMI) of 30.0 to 30.9 in adult, unspecified obesity type Maureen Elliott is currently in the  action stage of change. As such, her goal is to continue with weight loss efforts. She has agreed to the Category 2 Plan.   Exercise goals: As is, and add lower body strengthening.  Behavioral modification strategies: decreasing simple carbohydrates and holiday eating strategies .  Maureen Elliott has agreed to follow-up with our clinic in 3 weeks. She was informed of the importance of frequent follow-up visits to maximize her success with intensive lifestyle modifications for her multiple health conditions.   Objective:   Blood pressure 114/72, pulse 83, temperature 97.7 F (36.5 C), height 5\' 3"  (1.6 m), weight 152 lb (68.9 kg), SpO2 96 %. Body mass index is 26.93 kg/m.  General: Cooperative, alert, well developed, in no acute distress. HEENT: Conjunctivae and lids unremarkable. Cardiovascular: Regular rhythm.  Lungs: Normal work of breathing. Neurologic: No focal deficits.   Lab Results  Component Value Date   CREATININE 0.84 04/28/2020   BUN 13 04/28/2020   NA 139 04/28/2020   K 4.5 04/28/2020   CL 101 04/28/2020   CO2 27 04/28/2020   Lab Results  Component Value Date   ALT 29 04/28/2020   AST 21 04/28/2020   ALKPHOS 81 04/28/2020   BILITOT 0.3 04/28/2020   Lab Results  Component Value Date   HGBA1C 5.8 (H) 08/05/2020   HGBA1C 5.7 (H) 04/28/2020   Lab Results  Component Value Date   INSULIN 5.1 08/05/2020   INSULIN 7.6 04/28/2020   Lab Results  Component Value Date   TSH 1.560 04/28/2020   Lab Results  Component Value Date  CHOL 155 08/05/2020   HDL 55 08/05/2020   LDLCALC 88 08/05/2020   TRIG 61 08/05/2020   Lab Results  Component Value Date   WBC 10.9 (H) 04/14/2020   HGB 12.7 04/14/2020   HCT 39.4 04/14/2020   MCV 92.9 04/14/2020   PLT 256 04/14/2020   No results found for: IRON, TIBC, FERRITIN  Obesity Behavioral Intervention:   Approximately 15 minutes were spent on the discussion below.  ASK: We discussed the diagnosis of obesity with [name]  today and [name] agreed to give [place] permission to discuss obesity behavioral modification therapy today.  ASSESS: [name] has the diagnosis of obesity and her BMI today is 26.93. [name] is in the action stage of change.   ADVISE: [name] was educated on the multiple health risks of obesity as well as the benefit of weight loss to improve her health. She was advised of the need for long term treatment and the importance of lifestyle modifications to improve her current health and to decrease her risk of future health problems.  AGREE: Multiple dietary modification options and treatment options were discussed and [name] agreed to follow the recommendations documented in the above note.  ARRANGE: [name] was educated on the importance of frequent visits to treat obesity as outlined per CMS and USPSTF guidelines and agreed to schedule her next follow up appointment today.  Attestation Statements:   Reviewed by clinician on day of visit: allergies, medications, problem list, medical history, surgical history, family history, social history, and previous encounter notes.   I, Burt Knack, am acting as transcriptionist for Quillian Quince, MD.  I have reviewed the above documentation for accuracy and completeness, and I agree with the above. -  Quillian Quince, MD

## 2020-09-23 ENCOUNTER — Other Ambulatory Visit (INDEPENDENT_AMBULATORY_CARE_PROVIDER_SITE_OTHER): Payer: Self-pay | Admitting: Family Medicine

## 2020-09-23 DIAGNOSIS — E7849 Other hyperlipidemia: Secondary | ICD-10-CM

## 2020-09-28 ENCOUNTER — Other Ambulatory Visit: Payer: Self-pay

## 2020-09-28 ENCOUNTER — Encounter (INDEPENDENT_AMBULATORY_CARE_PROVIDER_SITE_OTHER): Payer: Self-pay | Admitting: Family Medicine

## 2020-09-28 ENCOUNTER — Other Ambulatory Visit (INDEPENDENT_AMBULATORY_CARE_PROVIDER_SITE_OTHER): Payer: Self-pay | Admitting: Family Medicine

## 2020-09-28 ENCOUNTER — Ambulatory Visit (INDEPENDENT_AMBULATORY_CARE_PROVIDER_SITE_OTHER): Payer: Medicare PPO | Admitting: Family Medicine

## 2020-09-28 VITALS — BP 121/73 | HR 77 | Temp 97.8°F | Ht 63.0 in | Wt 151.0 lb

## 2020-09-28 DIAGNOSIS — E7849 Other hyperlipidemia: Secondary | ICD-10-CM

## 2020-09-28 DIAGNOSIS — E559 Vitamin D deficiency, unspecified: Secondary | ICD-10-CM

## 2020-09-28 DIAGNOSIS — E669 Obesity, unspecified: Secondary | ICD-10-CM

## 2020-09-28 DIAGNOSIS — Z6830 Body mass index (BMI) 30.0-30.9, adult: Secondary | ICD-10-CM | POA: Diagnosis not present

## 2020-09-28 MED ORDER — VITAMIN D (ERGOCALCIFEROL) 1.25 MG (50000 UNIT) PO CAPS: 50000.0000 [IU] | ORAL_CAPSULE | ORAL | 0 refills | Status: DC

## 2020-09-28 NOTE — Progress Notes (Signed)
Chief Complaint:   OBESITY Maureen Elliott is here to discuss her progress with her obesity treatment plan along with follow-up of her obesity related diagnoses. Maureen Elliott is on the Category 2 Plan and states she is following her eating plan approximately 95% of the time. Maureen Elliott states she is doing water aerobics for 45 minutes 4-5 times per week.  Today's visit was #: 10 Starting weight: 171 lbs Starting date: 04/28/2020 Today's weight: 151 lbs Today's date: 09/28/2020 Total lbs lost to date: 20 lbs Total lbs lost since last in-office visit: 1 lb  Interim History: Maureen Elliott tried to adhere to meal plan while family ate desserts on Christmas. She did injure her back at water aerobics class last week. Minimal hunger. She is getting 4 ounces at lunch and 6-7 ounces at dinner. Maureen Elliott plans to rest a bit after the holiday season.   Subjective:   1. Vitamin D deficiency Maureen Elliott's Vitamin D level was 65.0 on 08/05/2020. She is currently taking prescription vitamin D 50,000 IU each week. She denies nausea, vomiting or muscle weakness, and notes fatigue.  2. Other hyperlipidemia Maureen Elliott is on statin (Lipitor) 10 mg daily. No myalgias.  Lab Results  Component Value Date   ALT 29 04/28/2020   AST 21 04/28/2020   ALKPHOS 81 04/28/2020   BILITOT 0.3 04/28/2020   Lab Results  Component Value Date   CHOL 155 08/05/2020   HDL 55 08/05/2020   LDLCALC 88 08/05/2020   TRIG 61 08/05/2020     Assessment/Plan:   1. Vitamin D deficiency Low Vitamin D level contributes to fatigue and are associated with obesity, breast, and colon cancer. She agrees to continue to take prescription Vitamin D @50 ,000 IU every week and will follow-up for routine testing of Vitamin D, at least 2-3 times per year to avoid over-replacement.  - Vitamin D, Ergocalciferol, (DRISDOL) 1.25 MG (50000 UNIT) CAPS capsule; Take 1 capsule (50,000 Units total) by mouth every 7 (seven) days.  Dispense: 4 capsule; Refill: 0  2. Other  hyperlipidemia Maureen Elliott will continue statin. No refill needed.  Counseling Intensive lifestyle modifications are the first line treatment for this issue. . Dietary changes: Increase soluble fiber. Decrease simple carbohydrates. . Exercise changes: Moderate to vigorous-intensity aerobic activity 150 minutes per week if tolerated. . Lipid-lowering medications: see documented in medical record.   3. Class 1 obesity with serious comorbidity and body mass index (BMI) of 30.0 to 30.9 in adult, unspecified obesity type  Maureen Elliott is currently in the action stage of change. As such, her goal is to continue with weight loss efforts. She has agreed to the Category 2 Plan.   Exercise goals: No exercise has been prescribed at this time.  Behavioral modification strategies: increasing lean protein intake, meal planning and cooking strategies and keeping healthy foods in the home.  Maureen Elliott has agreed to follow-up with our clinic in 2-3 weeks. She was informed of the importance of frequent follow-up visits to maximize her success with intensive lifestyle modifications for her multiple health conditions.    Objective:   Blood pressure 121/73, pulse 77, temperature 97.8 F (36.6 C), temperature source Oral, height 5\' 3"  (1.6 m), weight 151 lb (68.5 kg), SpO2 98 %. Body mass index is 26.75 kg/m.  General: Cooperative, alert, well developed, in no acute distress. HEENT: Conjunctivae and lids unremarkable. Cardiovascular: Regular rhythm.  Lungs: Normal work of breathing. Neurologic: No focal deficits.   Lab Results  Component Value Date   CREATININE 0.84 04/28/2020  BUN 13 04/28/2020   NA 139 04/28/2020   K 4.5 04/28/2020   CL 101 04/28/2020   CO2 27 04/28/2020   Lab Results  Component Value Date   ALT 29 04/28/2020   AST 21 04/28/2020   ALKPHOS 81 04/28/2020   BILITOT 0.3 04/28/2020   Lab Results  Component Value Date   HGBA1C 5.8 (H) 08/05/2020   HGBA1C 5.7 (H) 04/28/2020   Lab Results   Component Value Date   INSULIN 5.1 08/05/2020   INSULIN 7.6 04/28/2020   Lab Results  Component Value Date   TSH 1.560 04/28/2020   Lab Results  Component Value Date   CHOL 155 08/05/2020   HDL 55 08/05/2020   LDLCALC 88 08/05/2020   TRIG 61 08/05/2020   Lab Results  Component Value Date   WBC 10.9 (H) 04/14/2020   HGB 12.7 04/14/2020   HCT 39.4 04/14/2020   MCV 92.9 04/14/2020   PLT 256 04/14/2020   No results found for: IRON, TIBC, FERRITIN  Obesity Behavioral Intervention:   Approximately 15 minutes were spent on the discussion below.  ASK: We discussed the diagnosis of obesity with Maureen Elliott today and Maureen Elliott agreed to give [place] permission to discuss obesity behavioral modification therapy today.  ASSESS: Maureen Elliott has the diagnosis of obesity and her BMI today is 26.9. Maureen Elliott is in the action stage of change.   ADVISE: Maureen Elliott was educated on the multiple health risks of obesity as well as the benefit of weight loss to improve her health. She was advised of the need for long term treatment and the importance of lifestyle modifications to improve her current health and to decrease her risk of future health problems.  AGREE: Multiple dietary modification options and treatment options were discussed and Maureen Elliott agreed to follow the recommendations documented in the above note.  ARRANGE: Maureen Elliott was educated on the importance of frequent visits to treat obesity as outlined per CMS and USPSTF guidelines and agreed to schedule her next follow up appointment today.  Attestation Statements:   Reviewed by clinician on day of visit: allergies, medications, problem list, medical history, surgical history, family history, social history, and previous encounter notes.  I, Delorse Limber, am acting as transcriptionist for Reuben Likes, MD.  I have reviewed the above documentation for accuracy and completeness, and I agree with the above. - Katherina Mires, MD

## 2020-09-29 DIAGNOSIS — H6121 Impacted cerumen, right ear: Secondary | ICD-10-CM | POA: Diagnosis not present

## 2020-09-29 DIAGNOSIS — H903 Sensorineural hearing loss, bilateral: Secondary | ICD-10-CM | POA: Diagnosis not present

## 2020-10-12 ENCOUNTER — Ambulatory Visit (INDEPENDENT_AMBULATORY_CARE_PROVIDER_SITE_OTHER): Payer: Medicare PPO | Admitting: Family Medicine

## 2020-10-17 ENCOUNTER — Other Ambulatory Visit (INDEPENDENT_AMBULATORY_CARE_PROVIDER_SITE_OTHER): Payer: Self-pay | Admitting: Family Medicine

## 2020-10-17 DIAGNOSIS — E559 Vitamin D deficiency, unspecified: Secondary | ICD-10-CM

## 2020-10-19 ENCOUNTER — Other Ambulatory Visit (INDEPENDENT_AMBULATORY_CARE_PROVIDER_SITE_OTHER): Payer: Self-pay | Admitting: Family Medicine

## 2020-10-19 DIAGNOSIS — E7849 Other hyperlipidemia: Secondary | ICD-10-CM

## 2020-10-26 ENCOUNTER — Other Ambulatory Visit: Payer: Self-pay

## 2020-10-26 ENCOUNTER — Ambulatory Visit (INDEPENDENT_AMBULATORY_CARE_PROVIDER_SITE_OTHER): Payer: Medicare PPO | Admitting: Family Medicine

## 2020-10-26 ENCOUNTER — Encounter (INDEPENDENT_AMBULATORY_CARE_PROVIDER_SITE_OTHER): Payer: Self-pay | Admitting: Family Medicine

## 2020-10-26 VITALS — BP 107/65 | HR 81 | Temp 97.9°F | Ht 63.0 in | Wt 147.0 lb

## 2020-10-26 DIAGNOSIS — E669 Obesity, unspecified: Secondary | ICD-10-CM

## 2020-10-26 DIAGNOSIS — E7849 Other hyperlipidemia: Secondary | ICD-10-CM | POA: Diagnosis not present

## 2020-10-26 DIAGNOSIS — E559 Vitamin D deficiency, unspecified: Secondary | ICD-10-CM | POA: Diagnosis not present

## 2020-10-26 DIAGNOSIS — Z6830 Body mass index (BMI) 30.0-30.9, adult: Secondary | ICD-10-CM

## 2020-10-26 MED ORDER — VITAMIN D (ERGOCALCIFEROL) 1.25 MG (50000 UNIT) PO CAPS: 50000.0000 [IU] | ORAL_CAPSULE | ORAL | 0 refills | Status: DC

## 2020-10-26 MED ORDER — ATORVASTATIN CALCIUM 10 MG PO TABS: 10.0000 mg | ORAL_TABLET | Freq: Every day | ORAL | 0 refills | Status: DC

## 2020-10-27 NOTE — Progress Notes (Signed)
Chief Complaint:   OBESITY Maureen Elliott is here to discuss her progress with her obesity treatment plan along with follow-up of her obesity related diagnoses. Maureen Elliott is on the Category 2 Plan and states she is following her eating plan approximately 100% of the time. Maureen Elliott states she is doing back exercises 20 minutes 7 times per week.  Today's visit was #: 11 Starting weight: 171 lbs Starting date: 04/28/2020 Today's weight: 147 lbs Today's date: 10/26/2020 Total lbs lost to date: 24 lbs Total lbs lost since last in-office visit: 4 lbs  Interim History: Last few weeks pt hurt her back in the pool and so she hasn't been able to go to the gym for a month and is doing physical therapy exercises 2 days a week. Pt is doing well following meal plan. She is interested in substituting instant oatmeal for bread at breakfast. Pt is eating more venison recently, as her husband is a Therapist, nutritional.  Subjective:   1. Other hyperlipidemia Pt is on Lipitor 10 mg. She denies transaminitis. She denies myalgias.   2. Vitamin D deficiency Pt denies nausea, vomiting, and muscle weakness but notes fatigue. She is on prescription Vit D. Last Vit D was 65.0.   Assessment/Plan:   1. Other hyperlipidemia Cardiovascular risk and specific lipid/LDL goals reviewed.  We discussed several lifestyle modifications today and Maureen Elliott will continue to work on diet, exercise and weight loss efforts. Orders and follow up as documented in patient record.   Counseling Intensive lifestyle modifications are the first line treatment for this issue. . Dietary changes: Increase soluble fiber. Decrease simple carbohydrates. . Exercise changes: Moderate to vigorous-intensity aerobic activity 150 minutes per week if tolerated. . Lipid-lowering medications: see documented in medical record.  - atorvastatin (LIPITOR) 10 MG tablet; Take 1 tablet (10 mg total) by mouth daily.  Dispense: 30 tablet; Refill: 0  2. Vitamin D deficiency Low  Vitamin D level contributes to fatigue and are associated with obesity, breast, and colon cancer. She agrees to continue to take prescription Vitamin D @50 ,000 IU every week and will follow-up for routine testing of Vitamin D, at least 2-3 times per year to avoid over-replacement. Check labs at next OV.  - Vitamin D, Ergocalciferol, (DRISDOL) 1.25 MG (50000 UNIT) CAPS capsule; Take 1 capsule (50,000 Units total) by mouth every 7 (seven) days.  Dispense: 4 capsule; Refill: 0  3. Class 1 obesity with serious comorbidity and body mass index (BMI) of 30.0 to 30.9 in adult, unspecified obesity type Maureen Elliott is currently in the action stage of change. As such, her goal is to continue with weight loss efforts. She has agreed to the Category 2 Plan.   Exercise goals: All adults should avoid inactivity. Some physical activity is better than none, and adults who participate in any amount of physical activity gain some health benefits.  Behavioral modification strategies: increasing lean protein intake, meal planning and cooking strategies, keeping healthy foods in the home and planning for success.  Maureen Elliott has agreed to follow-up with our clinic in 3 weeks. She was informed of the importance of frequent follow-up visits to maximize her success with intensive lifestyle modifications for her multiple health conditions.   Objective:   Blood pressure 107/65, pulse 81, temperature 97.9 F (36.6 C), temperature source Oral, height 5\' 3"  (1.6 m), weight 147 lb (66.7 kg), SpO2 97 %. Body mass index is 26.04 kg/m.  General: Cooperative, alert, well developed, in no acute distress. HEENT: Conjunctivae and lids unremarkable. Cardiovascular:  Regular rhythm.  Lungs: Normal work of breathing. Neurologic: No focal deficits.   Lab Results  Component Value Date   CREATININE 0.84 04/28/2020   BUN 13 04/28/2020   NA 139 04/28/2020   K 4.5 04/28/2020   CL 101 04/28/2020   CO2 27 04/28/2020   Lab Results  Component  Value Date   ALT 29 04/28/2020   AST 21 04/28/2020   ALKPHOS 81 04/28/2020   BILITOT 0.3 04/28/2020   Lab Results  Component Value Date   HGBA1C 5.8 (H) 08/05/2020   HGBA1C 5.7 (H) 04/28/2020   Lab Results  Component Value Date   INSULIN 5.1 08/05/2020   INSULIN 7.6 04/28/2020   Lab Results  Component Value Date   TSH 1.560 04/28/2020   Lab Results  Component Value Date   CHOL 155 08/05/2020   HDL 55 08/05/2020   LDLCALC 88 08/05/2020   TRIG 61 08/05/2020   Lab Results  Component Value Date   WBC 10.9 (H) 04/14/2020   HGB 12.7 04/14/2020   HCT 39.4 04/14/2020   MCV 92.9 04/14/2020   PLT 256 04/14/2020   No results found for: IRON, TIBC, FERRITIN  Obesity Behavioral Intervention:   Approximately 15 minutes were spent on the discussion below.  ASK: We discussed the diagnosis of obesity with [name] today and Chelsye agreed to give [place] permission to discuss obesity behavioral modification therapy today.  ASSESS: Elsie has the diagnosis of obesity and her BMI today is 26.2. [place] is in the action stage of change.   ADVISE: Hayle was educated on the multiple health risks of obesity as well as the benefit of weight loss to improve her health. She was advised of the need for long term treatment and the importance of lifestyle modifications to improve her current health and to decrease her risk of future health problems.  AGREE: Multiple dietary modification options and treatment options were discussed and Lillionna agreed to follow the recommendations documented in the above note.  ARRANGE: Oumou was educated on the importance of frequent visits to treat obesity as outlined per CMS and USPSTF guidelines and agreed to schedule her next follow up appointment today.  Attestation Statements:   Reviewed by clinician on day of visit: allergies, medications, problem list, medical history, surgical history, family history, social history, and previous encounter notes.  Edmund Hilda, am acting as transcriptionist for Reuben Likes, MD.   I have reviewed the above documentation for accuracy and completeness, and I agree with the above. - Katherina Mires, MD

## 2020-11-09 ENCOUNTER — Encounter (INDEPENDENT_AMBULATORY_CARE_PROVIDER_SITE_OTHER): Payer: Self-pay | Admitting: Family Medicine

## 2020-11-09 ENCOUNTER — Other Ambulatory Visit: Payer: Self-pay

## 2020-11-09 ENCOUNTER — Ambulatory Visit (INDEPENDENT_AMBULATORY_CARE_PROVIDER_SITE_OTHER): Payer: Medicare PPO | Admitting: Family Medicine

## 2020-11-09 VITALS — BP 122/70 | HR 62 | Temp 97.6°F | Ht 63.0 in | Wt 147.0 lb

## 2020-11-09 DIAGNOSIS — R7303 Prediabetes: Secondary | ICD-10-CM | POA: Diagnosis not present

## 2020-11-09 DIAGNOSIS — E669 Obesity, unspecified: Secondary | ICD-10-CM | POA: Diagnosis not present

## 2020-11-09 DIAGNOSIS — E7849 Other hyperlipidemia: Secondary | ICD-10-CM

## 2020-11-09 DIAGNOSIS — Z6830 Body mass index (BMI) 30.0-30.9, adult: Secondary | ICD-10-CM

## 2020-11-09 DIAGNOSIS — E559 Vitamin D deficiency, unspecified: Secondary | ICD-10-CM | POA: Diagnosis not present

## 2020-11-10 LAB — LIPID PANEL WITH LDL/HDL RATIO
Cholesterol, Total: 153 mg/dL (ref 100–199)
HDL: 54 mg/dL (ref >39)
LDL Chol Calc (NIH): 88 mg/dL (ref 0–99)
LDL/HDL Ratio: 1.6 ratio (ref 0.0–3.2)
Triglycerides: 50 mg/dL (ref 0–149)
VLDL Cholesterol Cal: 11 mg/dL (ref 5–40)

## 2020-11-10 LAB — COMPREHENSIVE METABOLIC PANEL
ALT: 19 IU/L (ref 0–32)
AST: 24 IU/L (ref 0–40)
Albumin/Globulin Ratio: 1.6 (ref 1.2–2.2)
Albumin: 4.1 g/dL (ref 3.7–4.7)
Alkaline Phosphatase: 73 IU/L (ref 44–121)
BUN/Creatinine Ratio: 22 (ref 12–28)
BUN: 18 mg/dL (ref 8–27)
Bilirubin Total: 0.4 mg/dL (ref 0.0–1.2)
CO2: 25 mmol/L (ref 20–29)
Calcium: 9.5 mg/dL (ref 8.7–10.3)
Chloride: 104 mmol/L (ref 96–106)
Creatinine, Ser: 0.82 mg/dL (ref 0.57–1.00)
GFR calc Af Amer: 82 mL/min/{1.73_m2} (ref >59)
GFR calc non Af Amer: 71 mL/min/{1.73_m2} (ref >59)
Globulin, Total: 2.5 g/dL (ref 1.5–4.5)
Glucose: 88 mg/dL (ref 65–99)
Potassium: 4 mmol/L (ref 3.5–5.2)
Sodium: 142 mmol/L (ref 134–144)
Total Protein: 6.6 g/dL (ref 6.0–8.5)

## 2020-11-10 LAB — HEMOGLOBIN A1C
Est. average glucose Bld gHb Est-mCnc: 111 mg/dL
Hgb A1c MFr Bld: 5.5 % (ref 4.8–5.6)

## 2020-11-10 LAB — INSULIN, RANDOM: INSULIN: 2.8 u[IU]/mL (ref 2.6–24.9)

## 2020-11-10 LAB — VITAMIN D 25 HYDROXY (VIT D DEFICIENCY, FRACTURES): Vit D, 25-Hydroxy: 76.1 ng/mL (ref 30.0–100.0)

## 2020-11-10 NOTE — Progress Notes (Signed)
Chief Complaint:   OBESITY Maureen Elliott is here to discuss her progress with her obesity treatment plan along with follow-up of her obesity related diagnoses. Maureen Elliott is on the Category 2 Plan and states she is following her eating plan approximately 100% of the time. Maureen Elliott states she is going to the [employer] 45 minutes 3 times per week.  Today's visit was #: 12 Starting weight: 171 lbs Starting date: 04/28/2020 Today's weight: 147 lbs Today's date: 11/09/2020 Total lbs lost to date: 24 lbs Total lbs lost since last in-office visit: 0  Interim History: Pt has a non-scale victory of fitting into a red leather jacket that she got from [place]. She stayed on the meal plan and had 1 frito with some dip on Super Bowl. She denies hunger. Pt is excited her grand-daughter is coming to the clinic now. Pt voices she thinks she is going to go out to eat for her birthday. Her back is improving.  Subjective:   1. Other hyperlipidemia Pt's last LDL 88, HDL 55, and triglycerides 61. She is on Lipitor 10 mg daily. She denies myalgias.  2. Pre-diabetes Pt's last A1c was 5.8 with an insulin level of 5.1. She is not on medication.  3. Vitamin D deficiency Pt denies nausea, vomiting, and muscle weakness but notes fatigue. Pt is not on prescription Vit D.  Assessment/Plan:   1. Other hyperlipidemia Cardiovascular risk and specific lipid/LDL goals reviewed.  We discussed several lifestyle modifications today and Maureen Elliott will continue to work on diet, exercise and weight loss efforts. Orders and follow up as documented in patient record. Check labs today.  Counseling Intensive lifestyle modifications are the first line treatment for this issue. . Dietary changes: Increase soluble fiber. Decrease simple carbohydrates. . Exercise changes: Moderate to vigorous-intensity aerobic activity 150 minutes per week if tolerated. . Lipid-lowering medications: see documented in medical record.  - Comprehensive metabolic  panel - Lipid Panel With LDL/HDL Ratio  2. Pre-diabetes Maureen Elliott will continue to work on weight loss, exercise, and decreasing simple carbohydrates to help decrease the risk of diabetes. Check labs today.  - Hemoglobin A1c - Insulin, random  3. Vitamin D deficiency Low Vitamin D level contributes to fatigue and are associated with obesity, breast, and colon cancer. She agrees to follow-up for routine testing of Vitamin D, at least 2-3 times per year to avoid over-replacement. Check labs today.  - VITAMIN D 25 Hydroxy (Vit-D Deficiency, Fractures)  4. Class 1 obesity with serious comorbidity and body mass index (BMI) of 30.0 to 30.9 in adult, unspecified obesity type Maureen Elliott is currently in the action stage of change. As such, her goal is to continue with weight loss efforts. She has agreed to the Category 2 Plan.   Exercise goals: All adults should avoid inactivity. Some physical activity is better than none, and adults who participate in any amount of physical activity gain some health benefits.  Behavioral modification strategies: increasing lean protein intake, meal planning and cooking strategies, keeping healthy foods in the home and planning for success.  Maureen Elliott has agreed to follow-up with our clinic in 3-4 weeks. She was informed of the importance of frequent follow-up visits to maximize her success with intensive lifestyle modifications for her multiple health conditions.   Objective:   Blood pressure 122/70, pulse 62, temperature 97.6 F (36.4 C), temperature source Oral, height 5\' 3"  (1.6 m), weight 147 lb (66.7 kg), SpO2 99 %. Body mass index is 26.04 kg/m.  General: Cooperative, alert, well  developed, in no acute distress. HEENT: Conjunctivae and lids unremarkable. Cardiovascular: Regular rhythm.  Lungs: Normal work of breathing. Neurologic: No focal deficits.   Lab Results  Component Value Date   CREATININE 0.82 11/09/2020   BUN 18 11/09/2020   NA 142 11/09/2020   K  4.0 11/09/2020   CL 104 11/09/2020   CO2 25 11/09/2020   Lab Results  Component Value Date   ALT 19 11/09/2020   AST 24 11/09/2020   ALKPHOS 73 11/09/2020   BILITOT 0.4 11/09/2020   Lab Results  Component Value Date   HGBA1C 5.5 11/09/2020   HGBA1C 5.8 (H) 08/05/2020   HGBA1C 5.7 (H) 04/28/2020   Lab Results  Component Value Date   INSULIN 2.8 11/09/2020   INSULIN 5.1 08/05/2020   INSULIN 7.6 04/28/2020   Lab Results  Component Value Date   TSH 1.560 04/28/2020   Lab Results  Component Value Date   CHOL 153 11/09/2020   HDL 54 11/09/2020   LDLCALC 88 11/09/2020   TRIG 50 11/09/2020   Lab Results  Component Value Date   WBC 10.9 (H) 04/14/2020   HGB 12.7 04/14/2020   HCT 39.4 04/14/2020   MCV 92.9 04/14/2020   PLT 256 04/14/2020   No results found for: IRON, TIBC, FERRITIN  Obesity Behavioral Intervention:   Approximately 15 minutes were spent on the discussion below.  ASK: We discussed the diagnosis of obesity with Maureen Elliott today and Maureen Elliott agreed to give [place] permission to discuss obesity behavioral modification therapy today.  ASSESS: Maureen Elliott has the diagnosis of obesity and her BMI today is 26.1. Maureen Elliott is in the action stage of change.   ADVISE: Maureen Elliott was educated on the multiple health risks of obesity as well as the benefit of weight loss to improve her health. She was advised of the need for long term treatment and the importance of lifestyle modifications to improve her current health and to decrease her risk of future health problems.  AGREE: Multiple dietary modification options and treatment options were discussed and Maureen Elliott agreed to follow the recommendations documented in the above note.  ARRANGE: Maureen Elliott was educated on the importance of frequent visits to treat obesity as outlined per CMS and USPSTF guidelines and agreed to schedule her next follow up appointment today.  Attestation Statements:   Reviewed by clinician on day of visit: allergies,  medications, problem list, medical history, surgical history, family history, social history, and previous encounter notes.  Maureen Elliott, am acting as transcriptionist for Reuben Likes, MD.   I have reviewed the above documentation for accuracy and completeness, and I agree with the above. - Katherina Mires, MD

## 2020-11-15 DIAGNOSIS — H401121 Primary open-angle glaucoma, left eye, mild stage: Secondary | ICD-10-CM | POA: Diagnosis not present

## 2020-11-15 DIAGNOSIS — H401113 Primary open-angle glaucoma, right eye, severe stage: Secondary | ICD-10-CM | POA: Diagnosis not present

## 2020-11-17 ENCOUNTER — Other Ambulatory Visit (INDEPENDENT_AMBULATORY_CARE_PROVIDER_SITE_OTHER): Payer: Self-pay | Admitting: Family Medicine

## 2020-11-17 ENCOUNTER — Encounter (INDEPENDENT_AMBULATORY_CARE_PROVIDER_SITE_OTHER): Payer: Self-pay

## 2020-11-17 DIAGNOSIS — E7849 Other hyperlipidemia: Secondary | ICD-10-CM

## 2020-11-17 NOTE — Telephone Encounter (Signed)
Message sent to pt-CAS 

## 2020-11-23 ENCOUNTER — Ambulatory Visit (INDEPENDENT_AMBULATORY_CARE_PROVIDER_SITE_OTHER): Payer: Medicare PPO | Admitting: Bariatrics

## 2020-12-01 ENCOUNTER — Other Ambulatory Visit: Payer: Self-pay

## 2020-12-01 ENCOUNTER — Encounter (INDEPENDENT_AMBULATORY_CARE_PROVIDER_SITE_OTHER): Payer: Self-pay | Admitting: Family Medicine

## 2020-12-01 ENCOUNTER — Ambulatory Visit (INDEPENDENT_AMBULATORY_CARE_PROVIDER_SITE_OTHER): Payer: Medicare PPO | Admitting: Family Medicine

## 2020-12-01 VITALS — BP 144/70 | HR 74 | Temp 97.8°F | Ht 63.0 in | Wt 145.0 lb

## 2020-12-01 DIAGNOSIS — E7849 Other hyperlipidemia: Secondary | ICD-10-CM

## 2020-12-01 DIAGNOSIS — Z6830 Body mass index (BMI) 30.0-30.9, adult: Secondary | ICD-10-CM | POA: Diagnosis not present

## 2020-12-01 DIAGNOSIS — R7303 Prediabetes: Secondary | ICD-10-CM

## 2020-12-01 DIAGNOSIS — E559 Vitamin D deficiency, unspecified: Secondary | ICD-10-CM | POA: Diagnosis not present

## 2020-12-01 DIAGNOSIS — E669 Obesity, unspecified: Secondary | ICD-10-CM | POA: Diagnosis not present

## 2020-12-01 MED ORDER — ATORVASTATIN CALCIUM 10 MG PO TABS: 10.0000 mg | ORAL_TABLET | Freq: Every day | ORAL | 0 refills | Status: DC

## 2020-12-06 NOTE — Progress Notes (Signed)
Chief Complaint:   OBESITY Maureen Elliott is here to discuss her progress with her obesity treatment plan along with follow-up of her obesity related diagnoses. Maureen Elliott is on the Category 2 Plan and states she is following her eating plan approximately 100% of the time. Maureen Elliott states she is walking 3/4 mile, water aerobics, and silver sneakers 45 minutes 7 times per week.  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 She has followed category 2 meal plan 100% of the time. Her daughter is helping her get over her back issues and so pt has started walking again. Pt would like to do corned beef and cabbage for [employer] Patrick[employer] Day. She is getting between 6-8 oz of meat at dinner.   Subjective:   1. Vitamin D deficiency Maureen Elliott's Vitamin D level was 76.1. She is currently taking prescription vitamin D 50,000 IU each week. She denies nausea, vomiting or muscle weakness.  2. Other hyperlipidemia Maureen Elliott's LDL 88, HDL 54, and triglycerides 50. She is on Lipitor 10 mg daily.  Lab Results  Component Value Date   ALT 19 11/09/2020   AST 24 11/09/2020   ALKPHOS 73 11/09/2020   BILITOT 0.4 11/09/2020   Lab Results  Component Value Date   CHOL 153 11/09/2020   HDL 54 11/09/2020   LDLCALC 88 11/09/2020   TRIG 50 11/09/2020    3. Pre-diabetes Maureen Elliott's last A1c 5.5. She is not medication.  Lab Results  Component Value Date   HGBA1C 5.5 11/09/2020   Lab Results  Component Value Date   INSULIN 2.8 11/09/2020   INSULIN 5.1 08/05/2020   INSULIN 7.6 04/28/2020    Assessment/Plan:   1. Vitamin D deficiency Low Vitamin D level contributes to fatigue and are associated with obesity, breast, and colon cancer. She will stop  taking prescription Vitamin D @50 ,000 IU every week and retest 3 months after being off supplementation. Will follow-up for routine testing of Vitamin D, at least 2-3 times per year to avoid over-replacement.  2. Other hyperlipidemia Cardiovascular risk and specific lipid/LDL goals reviewed.  We discussed several lifestyle modifications today and Maureen Elliott will continue to work on diet, exercise and weight loss efforts. Orders and follow up as documented in patient record.   Counseling Intensive lifestyle modifications are the first line treatment for this issue. . Dietary changes: Increase soluble fiber. Decrease simple carbohydrates. . Exercise changes: Moderate to vigorous-intensity aerobic activity 150 minutes per week if tolerated. . Lipid-lowering medications: see documented in medical record.  - atorvastatin (LIPITOR) 10 MG tablet; Take 1 tablet (10 mg total) by mouth daily.  Dispense: 30 tablet; Refill: 0  3. Pre-diabetes Maureen Elliott will continue to work on weight loss, exercise, and decreasing simple carbohydrates to help decrease the risk of diabetes. Continue category 2 plan with no change.  4. Class 1 obesity with serious comorbidity and body mass index (BMI) of 30.0 to 30.9 in adult, unspecified obesity type Maureen Elliott is currently in the action stage of change. As such, her goal is to continue with weight loss efforts. She has agreed to the Category 2 Plan.   Exercise goals: As is- Maureen Elliott is to slowly titrate up to weights.  Behavioral modification strategies: increasing lean protein intake, meal  planning and cooking strategies and keeping healthy foods in the home.  Maureen Elliott has agreed to follow-up with our clinic in 2-3 weeks. She was informed of the importance of frequent follow-up visits to maximize her success with intensive lifestyle modifications for her multiple health conditions.   Objective:   Blood pressure (!) 144/70, pulse 74, temperature 97.8 F (36.6 C), temperature source  Oral, height 5\' 3"  (1.6 m), weight 145 lb (65.8 kg), SpO2 97 %. Body mass index is 25.69 kg/m.  General: Cooperative, alert, well developed, in no acute distress. HEENT: Conjunctivae and lids unremarkable. Cardiovascular: Regular rhythm.  Lungs: Normal work of breathing. Neurologic: No focal deficits.   Lab Results  Component Value Date   CREATININE 0.82 11/09/2020   BUN 18 11/09/2020   NA 142 11/09/2020   K 4.0 11/09/2020   CL 104 11/09/2020   CO2 25 11/09/2020   Lab Results  Component Value Date   ALT 19 11/09/2020   AST 24 11/09/2020   ALKPHOS 73 11/09/2020   BILITOT 0.4 11/09/2020   Lab Results  Component Value Date   HGBA1C 5.5 11/09/2020   HGBA1C 5.8 (H) 08/05/2020   HGBA1C 5.7 (H) 04/28/2020   Lab Results  Component Value Date   INSULIN 2.8 11/09/2020   INSULIN 5.1 08/05/2020   INSULIN 7.6 04/28/2020   Lab Results  Component Value Date   TSH 1.560 04/28/2020   Lab Results  Component Value Date   CHOL 153 11/09/2020   HDL 54 11/09/2020   LDLCALC 88 11/09/2020   TRIG 50 11/09/2020   Lab Results  Component Value Date   WBC 10.9 (H) 04/14/2020   HGB 12.7 04/14/2020   HCT 39.4 04/14/2020   MCV 92.9 04/14/2020   PLT 256 04/14/2020   No results found for: IRON, TIBC, FERRITIN  Obesity Behavioral Intervention:   Approximately 15 minutes were spent on the discussion below.  ASK: We discussed the diagnosis of obesity with 04/16/2020 today and Maureen Elliott agreed to give Maureen Elliott permission to discuss obesity behavioral modification therapy today.  ASSESS: Maureen Elliott has the diagnosis of obesity and her BMI today is 25.8. Maureen Elliott is in the action stage of change.   ADVISE: Maureen Elliott was educated on the multiple health risks of obesity as well as the benefit of weight loss to improve her health. She was advised of the need for long term treatment and the importance of lifestyle modifications to improve her current health and to decrease her risk of future health  problems.  AGREE: Multiple dietary modification options and treatment options were discussed and Maureen Elliott agreed to follow the recommendations documented in the above note.  ARRANGE: Maureen Elliott was educated on the importance of frequent visits to treat obesity as outlined per CMS and USPSTF guidelines and agreed to schedule her next follow up appointment today.  Attestation Statements:   Reviewed by clinician on day of visit: allergies, medications, problem list, medical history, surgical history, family history, social history, and previous encounter notes.  Maureen Elliott, am acting as transcriptionist for Edmund Hilda, MD.   I have reviewed the above documentation for accuracy and completeness, and I agree with the above. - Reuben Likes, MD

## 2020-12-15 DIAGNOSIS — H401113 Primary open-angle glaucoma, right eye, severe stage: Secondary | ICD-10-CM | POA: Diagnosis not present

## 2020-12-15 DIAGNOSIS — H401121 Primary open-angle glaucoma, left eye, mild stage: Secondary | ICD-10-CM | POA: Diagnosis not present

## 2020-12-20 ENCOUNTER — Ambulatory Visit (INDEPENDENT_AMBULATORY_CARE_PROVIDER_SITE_OTHER): Payer: Medicare PPO | Admitting: Family Medicine

## 2020-12-20 ENCOUNTER — Encounter (INDEPENDENT_AMBULATORY_CARE_PROVIDER_SITE_OTHER): Payer: Self-pay | Admitting: Family Medicine

## 2020-12-20 ENCOUNTER — Other Ambulatory Visit: Payer: Self-pay

## 2020-12-20 VITALS — BP 101/66 | HR 67 | Temp 97.5°F | Ht 63.0 in | Wt 142.0 lb

## 2020-12-20 DIAGNOSIS — Z6830 Body mass index (BMI) 30.0-30.9, adult: Secondary | ICD-10-CM

## 2020-12-20 DIAGNOSIS — E7800 Pure hypercholesterolemia, unspecified: Secondary | ICD-10-CM | POA: Diagnosis not present

## 2020-12-20 DIAGNOSIS — R7303 Prediabetes: Secondary | ICD-10-CM | POA: Diagnosis not present

## 2020-12-20 DIAGNOSIS — E669 Obesity, unspecified: Secondary | ICD-10-CM

## 2020-12-20 DIAGNOSIS — E66811 Obesity, class 1: Secondary | ICD-10-CM
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
## 2020-12-25 ENCOUNTER — Other Ambulatory Visit (INDEPENDENT_AMBULATORY_CARE_PROVIDER_SITE_OTHER): Payer: Self-pay | Admitting: Family Medicine

## 2020-12-25 DIAGNOSIS — E7849 Other hyperlipidemia: Secondary | ICD-10-CM

## 2020-12-27 DIAGNOSIS — D1722 Benign lipomatous neoplasm of skin and subcutaneous tissue of left arm: Secondary | ICD-10-CM | POA: Diagnosis not present

## 2020-12-27 DIAGNOSIS — B078 Other viral warts: Secondary | ICD-10-CM | POA: Diagnosis not present

## 2020-12-27 NOTE — Telephone Encounter (Signed)
Dr.Ukleja 

## 2020-12-30 DIAGNOSIS — D1722 Benign lipomatous neoplasm of skin and subcutaneous tissue of left arm: Secondary | ICD-10-CM | POA: Diagnosis not present

## 2021-01-07 ENCOUNTER — Other Ambulatory Visit (INDEPENDENT_AMBULATORY_CARE_PROVIDER_SITE_OTHER): Payer: Self-pay | Admitting: Family Medicine

## 2021-01-07 DIAGNOSIS — E7849 Other hyperlipidemia: Secondary | ICD-10-CM

## 2021-01-10 DIAGNOSIS — H6121 Impacted cerumen, right ear: Secondary | ICD-10-CM | POA: Diagnosis not present

## 2021-01-10 DIAGNOSIS — J310 Chronic rhinitis: Secondary | ICD-10-CM | POA: Diagnosis not present

## 2021-01-10 DIAGNOSIS — J342 Deviated nasal septum: Secondary | ICD-10-CM | POA: Diagnosis not present

## 2021-01-10 DIAGNOSIS — H903 Sensorineural hearing loss, bilateral: Secondary | ICD-10-CM | POA: Diagnosis not present

## 2021-01-17 ENCOUNTER — Ambulatory Visit (INDEPENDENT_AMBULATORY_CARE_PROVIDER_SITE_OTHER): Payer: Medicare PPO | Admitting: Family Medicine

## 2021-01-27 ENCOUNTER — Other Ambulatory Visit (INDEPENDENT_AMBULATORY_CARE_PROVIDER_SITE_OTHER): Payer: Self-pay | Admitting: Family Medicine

## 2021-01-27 DIAGNOSIS — E7849 Other hyperlipidemia: Secondary | ICD-10-CM

## 2021-01-27 NOTE — Telephone Encounter (Signed)
Dr.Ukleja 

## 2021-01-31 ENCOUNTER — Encounter (INDEPENDENT_AMBULATORY_CARE_PROVIDER_SITE_OTHER): Payer: Self-pay | Admitting: Family Medicine

## 2021-01-31 ENCOUNTER — Ambulatory Visit (INDEPENDENT_AMBULATORY_CARE_PROVIDER_SITE_OTHER): Payer: Medicare PPO | Admitting: Family Medicine

## 2021-01-31 ENCOUNTER — Other Ambulatory Visit: Payer: Self-pay

## 2021-01-31 VITALS — BP 109/65 | HR 70 | Temp 98.0°F | Ht 63.0 in | Wt 139.0 lb

## 2021-01-31 DIAGNOSIS — Z6830 Body mass index (BMI) 30.0-30.9, adult: Secondary | ICD-10-CM

## 2021-01-31 DIAGNOSIS — E7849 Other hyperlipidemia: Secondary | ICD-10-CM | POA: Diagnosis not present

## 2021-01-31 DIAGNOSIS — E669 Obesity, unspecified: Secondary | ICD-10-CM | POA: Diagnosis not present

## 2021-01-31 NOTE — Progress Notes (Signed)
The 10-year ASCVD risk score Denman George DC Montez Hageman., et al., 2013) is: 10.4%   Values used to calculate the score:     Age: 75 years     Sex: Female     Is Non-Hispanic African American: No     Diabetic: No     Tobacco smoker: No     Systolic Blood Pressure: 109 mmHg     Is BP treated: No     HDL Cholesterol: 54 mg/dL     Total Cholesterol: 153 mg/dL

## 2021-01-31 NOTE — Progress Notes (Signed)
Chief Complaint:   OBESITY Maureen Elliott is here to discuss her progress with her obesity treatment plan along with follow-up of her obesity related diagnoses. Maureen Elliott is on the Category 2 Plan and states she is following her eating plan approximately 90% of the time. Maureen Elliott states she is doing pool exercise for 45 minutes 3 times per week.  Today's visit was #: 15 Starting weight: 171 lbs Starting date: 04/28/2020 Today's weight: 139 lbs Today's date: 01/31/2021 Total lbs lost to date: 32 Total lbs lost since last in-office visit: 3  Interim History: Maureen Elliott says she is done with losing weight and feels she is at her weight goal. Her weight today is 139.6 lbs (24 BMI). She notes she has increased protein and decreased sweets since starting our plan. She is down 32 lbs over the last 9 months.  Subjective:   1. Other hyperlipidemia Maureen Elliott's LDL has reduced from 165 on (04/28/2020) to 88 on (11/09/2020). Her atorvastatin was discontinued at her last office visit by Dr. Lawson Radar. She is taking fish oil and plant sterols.  Lab Results  Component Value Date   ALT 19 11/09/2020   AST 24 11/09/2020   ALKPHOS 73 11/09/2020   BILITOT 0.4 11/09/2020   Lab Results  Component Value Date   CHOL 153 11/09/2020   HDL 54 11/09/2020   LDLCALC 88 11/09/2020   TRIG 50 11/09/2020   Assessment/Plan:   1. Other hyperlipidemia Cardiovascular risk and specific lipid/LDL goals reviewed.Maureen Elliott will continue her fih oil and plant sterols. Recheck FLP in July 2022.   2. Overweight: Current BMI 24.63 Maureen Elliott is currently in the action stage of change. As such, her goal is to maintain weight for now. She has agreed to the Category 3 Plan.   Maureen Elliott's weight goal is 138-142 lbs.  Handouts were given today: Category 3 plan, Breakfast and Lunch options.  Exercise goals: As is.  Behavioral modification strategies: increasing lean protein intake.  Maureen Elliott has agreed to follow-up with our clinic in 4 weeks.  Objective:    Blood pressure 109/65, pulse 70, temperature 98 F (36.7 C), height 5\' 3"  (1.6 m), weight 139 lb (63 kg), SpO2 98 %. Body mass index is 24.62 kg/m.  General: Cooperative, alert, well developed, in no acute distress. HEENT: Conjunctivae and lids unremarkable. Cardiovascular: Regular rhythm.  Lungs: Normal work of breathing. Neurologic: No focal deficits.   Lab Results  Component Value Date   CREATININE 0.82 11/09/2020   BUN 18 11/09/2020   NA 142 11/09/2020   K 4.0 11/09/2020   CL 104 11/09/2020   CO2 25 11/09/2020   Lab Results  Component Value Date   ALT 19 11/09/2020   AST 24 11/09/2020   ALKPHOS 73 11/09/2020   BILITOT 0.4 11/09/2020   Lab Results  Component Value Date   HGBA1C 5.5 11/09/2020   HGBA1C 5.8 (H) 08/05/2020   HGBA1C 5.7 (H) 04/28/2020   Lab Results  Component Value Date   INSULIN 2.8 11/09/2020   INSULIN 5.1 08/05/2020   INSULIN 7.6 04/28/2020   Lab Results  Component Value Date   TSH 1.560 04/28/2020   Lab Results  Component Value Date   CHOL 153 11/09/2020   HDL 54 11/09/2020   LDLCALC 88 11/09/2020   TRIG 50 11/09/2020   Lab Results  Component Value Date   WBC 10.9 (H) 04/14/2020   HGB 12.7 04/14/2020   HCT 39.4 04/14/2020   MCV 92.9 04/14/2020   PLT 256 04/14/2020  No results found for: IRON, TIBC, FERRITIN  Obesity Behavioral Intervention:   Approximately 15 minutes were spent on the discussion below.  ASK: We discussed the diagnosis of obesity with Maureen Elliott today and Maureen Elliott agreed to give [place] permission to discuss obesity behavioral modification therapy today.  ASSESS: Maureen Elliott has the diagnosis of obesity and her BMI today is 24.93. Maureen Elliott is in the action stage of change.   ADVISE: Maureen Elliott was educated on the multiple health risks of obesity as well as the benefit of weight loss to improve her health. She was advised of the need for long term treatment and the importance of lifestyle modifications to improve her current health  and to decrease her risk of future health problems.  AGREE: Multiple dietary modification options and treatment options were discussed and Maureen Elliott agreed to follow the recommendations documented in the above note.  ARRANGE: Maureen Elliott was educated on the importance of frequent visits to treat obesity as outlined per CMS and USPSTF guidelines and agreed to schedule her next follow up appointment today.  Attestation Statements:   Reviewed by clinician on day of visit: allergies, medications, problem list, medical history, surgical history, family history, social history, and previous encounter notes.   Maureen Elliott, am acting as Energy manager for [place], FNP-C.  I have reviewed the above documentation for accuracy and completeness, and I agree with the above. -  Maureen Sans, FNP

## 2021-02-01 ENCOUNTER — Encounter (INDEPENDENT_AMBULATORY_CARE_PROVIDER_SITE_OTHER): Payer: Self-pay | Admitting: Family Medicine

## 2021-02-07 DIAGNOSIS — Z20828 Contact with and (suspected) exposure to other viral communicable diseases: Secondary | ICD-10-CM | POA: Diagnosis not present

## 2021-02-24 ENCOUNTER — Other Ambulatory Visit (INDEPENDENT_AMBULATORY_CARE_PROVIDER_SITE_OTHER): Payer: Self-pay | Admitting: Family Medicine

## 2021-02-24 DIAGNOSIS — E7849 Other hyperlipidemia: Secondary | ICD-10-CM

## 2021-02-24 NOTE — Telephone Encounter (Signed)
Last seen Dawn 

## 2021-02-28 ENCOUNTER — Encounter (INDEPENDENT_AMBULATORY_CARE_PROVIDER_SITE_OTHER): Payer: Self-pay | Admitting: Family Medicine

## 2021-02-28 ENCOUNTER — Other Ambulatory Visit: Payer: Self-pay

## 2021-02-28 ENCOUNTER — Ambulatory Visit (INDEPENDENT_AMBULATORY_CARE_PROVIDER_SITE_OTHER): Payer: Medicare PPO | Admitting: Family Medicine

## 2021-02-28 VITALS — BP 154/76 | HR 64 | Temp 98.3°F | Ht 63.0 in | Wt 140.0 lb

## 2021-02-28 DIAGNOSIS — R030 Elevated blood-pressure reading, without diagnosis of hypertension: Secondary | ICD-10-CM | POA: Diagnosis not present

## 2021-02-28 DIAGNOSIS — E669 Obesity, unspecified: Secondary | ICD-10-CM

## 2021-02-28 DIAGNOSIS — Z6830 Body mass index (BMI) 30.0-30.9, adult: Secondary | ICD-10-CM

## 2021-03-02 ENCOUNTER — Ambulatory Visit (INDEPENDENT_AMBULATORY_CARE_PROVIDER_SITE_OTHER): Payer: Medicare PPO | Admitting: Family Medicine

## 2021-03-02 NOTE — Progress Notes (Signed)
Chief Complaint:   OBESITY Camaryn is here to discuss her progress with her obesity treatment plan along with follow-up of her obesity related diagnoses. Claudell is on the Category 3 Plan and states she is following her eating plan approximately 80% of the time. Kenniyah states she is doing water aerobics and Silver Sneakers 45 minutes 5 times per week.  Today's visit was #: 16 Starting weight: 171 lbs Starting date: 04/28/2020 Today's weight: 140 lbs Today's date: 02/28/2021 Total lbs lost to date: 31 lbs Total lbs lost since last in-office visit: +1  Interim History: Bracha is in weight maintenance and has maintained her weight within her goal range of 138-142 lbs. We changed her to category 3 last OV and she notes she gained 2 lbs. She has since done category 2 plus a few extra foods.   Subjective:   1. Elevated blood-pressure reading without diagnosis of hypertension [name] notes BP elevated due to stress over caring for her 29 year old granddaughter. She has no history of hypertension and is not on any anti-hypertensives.  BP Readings from Last 3 Encounters:  02/28/21 (!) 154/76  01/31/21 109/65  12/20/20 101/66    Assessment/Plan:   1. Elevated blood-pressure reading without diagnosis of hypertension  Continue to monitor.  2. Overweight: Current BMI 24.81  Dawson is currently in the action stage of change. As such, her goal is to continue with weight loss efforts. She has agreed to the Category 2 Plan 200 calories and keeping a food journal and adhering to recommended goals of 1200-1400 calories and 80 grams protein.   Exercise goals:  As is  Behavioral modification strategies: increasing lean protein intake.  Hanifa has agreed to follow-up with our clinic in 3-4 weeks.   Objective:   Blood pressure (!) 154/76, pulse 64, temperature 98.3 F (36.8 C), height 5\' 3"  (1.6 m), weight 140 lb (63.5 kg), SpO2 99 %. Body mass index is 24.8 kg/m.  General: Cooperative, alert,  well developed, in no acute distress. HEENT: Conjunctivae and lids unremarkable. Cardiovascular: Regular rhythm.  Lungs: Normal work of breathing. Neurologic: No focal deficits.   Lab Results  Component Value Date   CREATININE 0.82 11/09/2020   BUN 18 11/09/2020   NA 142 11/09/2020   K 4.0 11/09/2020   CL 104 11/09/2020   CO2 25 11/09/2020   Lab Results  Component Value Date   ALT 19 11/09/2020   AST 24 11/09/2020   ALKPHOS 73 11/09/2020   BILITOT 0.4 11/09/2020   Lab Results  Component Value Date   HGBA1C 5.5 11/09/2020   HGBA1C 5.8 (H) 08/05/2020   HGBA1C 5.7 (H) 04/28/2020   Lab Results  Component Value Date   INSULIN 2.8 11/09/2020   INSULIN 5.1 08/05/2020   INSULIN 7.6 04/28/2020   Lab Results  Component Value Date   TSH 1.560 04/28/2020   Lab Results  Component Value Date   CHOL 153 11/09/2020   HDL 54 11/09/2020   LDLCALC 88 11/09/2020   TRIG 50 11/09/2020   Lab Results  Component Value Date   WBC 10.9 (H) 04/14/2020   HGB 12.7 04/14/2020   HCT 39.4 04/14/2020   MCV 92.9 04/14/2020   PLT 256 04/14/2020   No results found for: IRON, TIBC, FERRITIN  Obesity Behavioral Intervention:   Approximately 15 minutes were spent on the discussion below.  ASK: We discussed the diagnosis of obesity with 04/16/2020 today and Jacoya agreed to give [name] permission to discuss obesity behavioral  modification therapy today.  ASSESS: Ameera has the diagnosis of obesity and her BMI today is 24.9. Latonda is in the action stage of change.   ADVISE: Rhaelyn was educated on the multiple health risks of obesity as well as the benefit of weight loss to improve her health. She was advised of the need for long term treatment and the importance of lifestyle modifications to improve her current health and to decrease her risk of future health problems.  AGREE: Multiple dietary modification options and treatment options were discussed and Kieryn agreed to follow the recommendations  documented in the above note.  ARRANGE: Macala was educated on the importance of frequent visits to treat obesity as outlined per CMS and USPSTF guidelines and agreed to schedule her next follow up appointment today.  Attestation Statements:   Reviewed by clinician on day of visit: allergies, medications, problem list, medical history, surgical history, family history, social history, and previous encounter notes.  Edmund Hilda, CMA, am acting as Energy manager for [place], FNP.  I have reviewed the above documentation for accuracy and completeness, and I agree with the above. - Jesse Sans, FNP

## 2021-03-06 ENCOUNTER — Emergency Department (HOSPITAL_COMMUNITY)
Admission: EM | Admit: 2021-03-06 | Discharge: 2021-03-06 | Disposition: A | Discharge status: Home / Self Care | Payer: Medicare PPO | Attending: Emergency Medicine | Admitting: Emergency Medicine

## 2021-03-06 ENCOUNTER — Encounter (HOSPITAL_COMMUNITY): Payer: Self-pay | Admitting: Emergency Medicine

## 2021-03-06 ENCOUNTER — Emergency Department (HOSPITAL_COMMUNITY): Payer: Medicare PPO

## 2021-03-06 ENCOUNTER — Other Ambulatory Visit: Payer: Self-pay

## 2021-03-06 ENCOUNTER — Encounter (INDEPENDENT_AMBULATORY_CARE_PROVIDER_SITE_OTHER): Payer: Self-pay | Admitting: Family Medicine

## 2021-03-06 DIAGNOSIS — R42 Dizziness and giddiness: Secondary | ICD-10-CM | POA: Diagnosis not present

## 2021-03-06 DIAGNOSIS — Z20822 Contact with and (suspected) exposure to covid-19: Secondary | ICD-10-CM | POA: Diagnosis not present

## 2021-03-06 DIAGNOSIS — R61 Generalized hyperhidrosis: Secondary | ICD-10-CM | POA: Insufficient documentation

## 2021-03-06 DIAGNOSIS — R110 Nausea: Secondary | ICD-10-CM | POA: Diagnosis not present

## 2021-03-06 DIAGNOSIS — R112 Nausea with vomiting, unspecified: Secondary | ICD-10-CM | POA: Insufficient documentation

## 2021-03-06 DIAGNOSIS — G9389 Other specified disorders of brain: Secondary | ICD-10-CM | POA: Diagnosis not present

## 2021-03-06 DIAGNOSIS — R29810 Facial weakness: Secondary | ICD-10-CM | POA: Diagnosis not present

## 2021-03-06 DIAGNOSIS — Z79899 Other long term (current) drug therapy: Secondary | ICD-10-CM | POA: Diagnosis not present

## 2021-03-06 DIAGNOSIS — R1111 Vomiting without nausea: Secondary | ICD-10-CM | POA: Diagnosis not present

## 2021-03-06 DIAGNOSIS — R001 Bradycardia, unspecified: Secondary | ICD-10-CM | POA: Diagnosis not present

## 2021-03-06 LAB — BASIC METABOLIC PANEL
Anion gap: 6 (ref 5–15)
BUN: 25 mg/dL — ABNORMAL HIGH (ref 8–23)
CO2: 26 mmol/L (ref 22–32)
Calcium: 9 mg/dL (ref 8.9–10.3)
Chloride: 105 mmol/L (ref 98–111)
Creatinine, Ser: 0.82 mg/dL (ref 0.44–1.00)
GFR, Estimated: >60 mL/min (ref >60)
Glucose, Bld: 117 mg/dL — ABNORMAL HIGH (ref 70–99)
Potassium: 5.3 mmol/L — ABNORMAL HIGH (ref 3.5–5.1)
Sodium: 137 mmol/L (ref 135–145)

## 2021-03-06 LAB — COMPREHENSIVE METABOLIC PANEL
ALT: 16 U/L (ref 0–44)
AST: 33 U/L (ref 15–41)
Albumin: 3.4 g/dL — ABNORMAL LOW (ref 3.5–5.0)
Alkaline Phosphatase: 50 U/L (ref 38–126)
Anion gap: 6 (ref 5–15)
BUN: 27 mg/dL — ABNORMAL HIGH (ref 8–23)
CO2: 26 mmol/L (ref 22–32)
Calcium: 9 mg/dL (ref 8.9–10.3)
Chloride: 105 mmol/L (ref 98–111)
Creatinine, Ser: 0.95 mg/dL (ref 0.44–1.00)
GFR, Estimated: >60 mL/min (ref >60)
Glucose, Bld: 113 mg/dL — ABNORMAL HIGH (ref 70–99)
Potassium: 5.3 mmol/L — ABNORMAL HIGH (ref 3.5–5.1)
Sodium: 137 mmol/L (ref 135–145)
Total Bilirubin: 0.8 mg/dL (ref 0.3–1.2)
Total Protein: 6.7 g/dL (ref 6.5–8.1)

## 2021-03-06 LAB — PROTIME-INR
INR: 1.1 (ref 0.8–1.2)
Prothrombin Time: 14.4 seconds (ref 11.4–15.2)

## 2021-03-06 LAB — CBC WITH DIFFERENTIAL/PLATELET
Abs Immature Granulocytes: 0.03 10*3/uL (ref 0.00–0.07)
Basophils Absolute: 0 10*3/uL (ref 0.0–0.1)
Basophils Relative: 0 %
Eosinophils Absolute: 0.1 10*3/uL (ref 0.0–0.5)
Eosinophils Relative: 1 %
HCT: 37.7 % (ref 36.0–46.0)
Hemoglobin: 12.4 g/dL (ref 12.0–15.0)
Immature Granulocytes: 0 %
Lymphocytes Relative: 13 %
Lymphs Abs: 1 10*3/uL (ref 0.7–4.0)
MCH: 31 pg (ref 26.0–34.0)
MCHC: 32.9 g/dL (ref 30.0–36.0)
MCV: 94.3 fL (ref 80.0–100.0)
Monocytes Absolute: 0.3 10*3/uL (ref 0.1–1.0)
Monocytes Relative: 4 %
Neutro Abs: 6.3 10*3/uL (ref 1.7–7.7)
Neutrophils Relative %: 82 %
Platelets: 202 10*3/uL (ref 150–400)
RBC: 4 MIL/uL (ref 3.87–5.11)
RDW: 14 % (ref 11.5–15.5)
WBC: 7.6 10*3/uL (ref 4.0–10.5)
nRBC: 0 % (ref 0.0–0.2)

## 2021-03-06 LAB — RAPID URINE DRUG SCREEN, HOSP PERFORMED
Amphetamines: NOT DETECTED
Barbiturates: NOT DETECTED
Benzodiazepines: NOT DETECTED
Cocaine: NOT DETECTED
Opiates: NOT DETECTED
Tetrahydrocannabinol: NOT DETECTED

## 2021-03-06 LAB — RESP PANEL BY RT-PCR (FLU A&B, COVID) ARPGX2
Influenza A by PCR: NEGATIVE
Influenza B by PCR: NEGATIVE
SARS Coronavirus 2 by RT PCR: NEGATIVE

## 2021-03-06 LAB — URINALYSIS, ROUTINE W REFLEX MICROSCOPIC
Bilirubin Urine: NEGATIVE
Glucose, UA: NEGATIVE mg/dL
Hgb urine dipstick: NEGATIVE
Ketones, ur: NEGATIVE mg/dL
Leukocytes,Ua: NEGATIVE
Nitrite: NEGATIVE
Protein, ur: NEGATIVE mg/dL
Specific Gravity, Urine: 1.013 (ref 1.005–1.030)
pH: 6 (ref 5.0–8.0)

## 2021-03-06 LAB — CBG MONITORING, ED: Glucose-Capillary: 113 mg/dL — ABNORMAL HIGH (ref 70–99)

## 2021-03-06 LAB — ETHANOL: Alcohol, Ethyl (B): <10 mg/dL (ref <10)

## 2021-03-06 LAB — APTT: aPTT: 25 seconds (ref 24–36)

## 2021-03-06 MED ORDER — ONDANSETRON 4 MG PO TBDP: 4.0000 mg | ORAL_TABLET | Freq: Three times a day (TID) | ORAL | 0 refills | Status: DC | PRN

## 2021-03-06 MED ORDER — MECLIZINE HCL 25 MG PO TABS
25.0000 mg | ORAL_TABLET | Freq: Once | ORAL | Status: AC
  Administered 2021-03-06: 25 mg via ORAL
  Filled 2021-03-06: qty 1

## 2021-03-06 MED ORDER — MECLIZINE HCL 25 MG PO TABS: 25.0000 mg | ORAL_TABLET | Freq: Three times a day (TID) | ORAL | 0 refills | Status: DC | PRN

## 2021-03-06 MED ORDER — ONDANSETRON HCL 4 MG/2ML IJ SOLN
4.0000 mg | Freq: Once | INTRAMUSCULAR | Status: AC
  Administered 2021-03-06: 4 mg via INTRAVENOUS
  Filled 2021-03-06: qty 2

## 2021-03-06 NOTE — ED Provider Notes (Signed)
[facility]-EMERGENCY DEPT Provider Note   CSN: [id number] Arrival date & time: 03/06/21  5176     History Chief Complaint  Patient presents with   Dizziness   Nausea    Maureen Elliott is a 75 y.o. female.  Patient woke this morning with sudden severe room spinning.  Resulting in diaphoresis nausea and some vomiting.  Patient received COVID booster last week.  Brought in by [facility].  Blood pressure was 108/70 they gave her 250 cc of fluid in route.  Blood pressure upon arrival was 138/78.  Blood sugar was 129.  No fever.  Patient without any symptoms yesterday.  Patient states that she has been having some memory problems gradually over the last several weeks.  Patient denies any visual changes any speech problems any numbness or weakness in her legs.  No headache.  She has not had a prior history of vertigo.      Past Medical History:  Diagnosis Date   Complication of anesthesia    be careful with intubation due to previous cervical neck surgery   Cystocele    Diverticulitis    Diverticulosis    DVT (deep venous thrombosis) (HCC)    left leg   Gallbladder problem    Gestational diabetes    Glaucoma    Hemorrhoids internal and external   High cholesterol    History of gestational diabetes yrs ago   Joint pain    Lactose intolerance    Nocturia    Osteoarthritis    Partial hearing loss    PONV (postoperative nausea and vomiting) 2013   n/v after phenergan, injectable phenergan helps with nausea   Prediabetes     Patient Active Problem List   Diagnosis Date Noted   Class 1 obesity with serious comorbidity and body mass index (BMI) of 30.0 to 30.9 in adult 01/31/2021   Uveitis 10/21/2019   Numbness 10/21/2019   Bilateral hearing loss 10/21/2019   Cystocele 01/13/2014    Past Surgical History:  Procedure Laterality Date   ABDOMINAL HYSTERECTOMY  2013   ovaries removed    BLADDER SURGERY     BREAST BIOPSY Bilateral 2001    cervical fusion C 5, C 6 and C 7  2004   colonscopy  feb 2015   blood in stool, just was hemorrhoids   CYSTOCELE REPAIR N/A 01/13/2014   Procedure: CYSTOSCOPY, REPAIR CYSTOCELE VAULT PROLAPSE/GRAFT;  Surgeon: Martina Sinner, MD;  Location: [facility];  Service: Urology;  Laterality: N/A;   EYE SURGERY Bilateral 8 yrs ago   lens replacement for cataract   GLAUCOMA SURGERY     SEPTOPLASTY Bilateral 03/01/2020   Procedure: NASAL SEPTOPLASTY;  Surgeon: Newman Pies, MD;  Location: [facility];  Service: ENT;  Laterality: Bilateral;   tummy tuck with mesh  6 yrs ago   large abdominal is present     OB History     Gravida  1   Para      Term      Preterm      AB      Living         SAB      IAB      Ectopic      Multiple      Live Births              Family History  Problem Relation Age of Onset   Breast cancer Maternal Aunt    Breast  cancer Maternal Aunt    Breast cancer Maternal Aunt    Cancer Mother    Hypertension Father    Cancer Father    Alcohol abuse Father    Obesity Father     Social History   Tobacco Use   Smoking status: Never   Smokeless tobacco: Never  Vaping Use   Vaping Use: Never used  Substance Use Topics   Alcohol use: Yes    Comment: occasional wine with dinner   Drug use: No    Home Medications Prior to Admission medications   Medication Sig Start Date End Date Taking? Authorizing Provider  atorvastatin (LIPITOR) 10 MG tablet Take 10 mg by mouth daily.   Yes [provider]  brimonidine (ALPHAGAN) 0.2 % ophthalmic solution Place 1 drop into both eyes 3 (three) times daily.   Yes [provider]  CALCIUM PO Take 1 Dose by mouth daily.   Yes [provider]  Carboxymethylcell-Hypromellose (GENTEAL OP) Apply 1 drop to eye as needed (dry eyes).   Yes [provider]  conjugated estrogens (PREMARIN) vaginal cream Place 1 Applicatorful vaginally 2 (two) times a week.   Yes [provider]  ergocalciferol (VITAMIN D2) 1.25 MG (50000 UT) capsule Take 50,000 Units by mouth once a week.   Yes [provider]  Glucosamine HCl (GLUCOSAMINE PO) Take 1 Dose by mouth daily.   Yes [provider]  meclizine (ANTIVERT) 25 MG tablet Take 1 tablet (25 mg total) by mouth 3 (three) times daily as needed for dizziness. 03/06/21  Yes Vanetta Mulders, MD  Omega-3 Fatty Acids (FISH OIL) 1200 MG CAPS Take 1,200 mg by mouth daily.   Yes [provider]  ondansetron (ZOFRAN ODT) 4 MG disintegrating tablet Take 1 tablet (4 mg total) by mouth every 8 (eight) hours as needed for nausea or vomiting. 03/06/21  Yes Vanetta Mulders, MD  pilocarpine (PILOCAR) 1 % ophthalmic solution Place 1 drop into both eyes 3 (three) times daily. 04/10/20  Yes [provider]  timolol (TIMOPTIC) 0.5 % ophthalmic solution Place 1 drop into both eyes 2 (two) times daily. 01/24/21  Yes [provider]  Tretinoin 0.075 % CREA Apply 1 application topically at bedtime.   Yes [provider]    Allergies    Amoxicillin, Caffeine, Dorzolamide, Keflex [cephalexin], Levaquin [levofloxacin], Maxitrol [neomycin-polymyxin-dexameth], Methotrexate derivatives, Prednisone, Restasis [cyclosporine], and Valtrex [valacyclovir]  Review of Systems   Review of Systems  Constitutional:  Positive for diaphoresis. Negative for chills and fever.  HENT:  Negative for ear pain and sore throat.   Eyes:  Negative for pain and visual disturbance.  Respiratory:  Negative for cough and shortness of breath.   Cardiovascular:  Negative for chest pain and palpitations.  Gastrointestinal:  Positive for nausea and vomiting. Negative for abdominal pain.  Genitourinary:  Negative for dysuria and hematuria.  Musculoskeletal:  Negative for arthralgias and back pain.  Skin:  Negative for color change and rash.  Neurological:  Positive for dizziness. Negative for seizures, syncope, facial  asymmetry, speech difficulty, weakness, numbness and headaches.  All other systems reviewed and are negative.  Physical Exam Updated Vital Signs BP 127/65   Pulse (!) 57   Temp 97.8 F (36.6 C) (Oral)   Resp 16   Ht 1.6 m (5\' 3" )   Wt 63.5 kg   SpO2 97%   BMI 24.80 kg/m   Physical Exam Vitals and nursing note reviewed.  Constitutional:      General:  She is not in acute distress.    Appearance: Normal appearance. She is well-developed.  HENT:     Head: Normocephalic and atraumatic.  Eyes:     Extraocular Movements: Extraocular movements intact.     Conjunctiva/sclera: Conjunctivae normal.     Pupils: Pupils are equal, round, and reactive to light.  Cardiovascular:     Rate and Rhythm: Normal rate and regular rhythm.     Heart sounds: No murmur heard. Pulmonary:     Effort: Pulmonary effort is normal. No respiratory distress.     Breath sounds: Normal breath sounds.  Abdominal:     Palpations: Abdomen is soft.     Tenderness: There is no abdominal tenderness.  Musculoskeletal:        General: No swelling.     Cervical back: Normal range of motion and neck supple. No rigidity.  Skin:    General: Skin is warm and dry.     Capillary Refill: Capillary refill takes less than 2 seconds.  Neurological:     General: No focal deficit present.     Mental Status: She is alert. Mental status is at baseline.     Cranial Nerves: No cranial nerve deficit.     Sensory: No sensory deficit.     Motor: No weakness.    ED Results / Procedures / Treatments   Labs (all labs ordered are listed, but only abnormal results are displayed) Labs Reviewed  BASIC METABOLIC PANEL - Abnormal; Notable for the following components:      Result Value   Potassium 5.3 (*)    Glucose, Bld 117 (*)    BUN 25 (*)    All other components within normal limits  COMPREHENSIVE METABOLIC PANEL - Abnormal; Notable for the following components:   Potassium 5.3 (*)    Glucose, Bld 113 (*)    BUN 27 (*)     Albumin 3.4 (*)    All other components within normal limits  URINALYSIS, ROUTINE W REFLEX MICROSCOPIC - Abnormal; Notable for the following components:   APPearance HAZY (*)    All other components within normal limits  CBG MONITORING, ED - Abnormal; Notable for the following components:   Glucose-Capillary 113 (*)    All other components within normal limits  RESP PANEL BY RT-PCR (FLU A&B, COVID) ARPGX2  ETHANOL  PROTIME-INR  APTT  RAPID URINE DRUG SCREEN, HOSP PERFORMED  CBC WITH DIFFERENTIAL/PLATELET  CBC    EKG EKG Interpretation  Date/Time:  Sunday March 06 2021 08:51:54 EDT Ventricular Rate:  59 PR Interval:  191 QRS Duration: 91 QT Interval:  435 QTC Calculation: 431 R Axis:   11 Text Interpretation: Sinus arrhythmia Low voltage, precordial leads Nonspecific T abnormalities, anterior leads No significant change since last tracing Confirmed by Vanetta MuldersZackowski, Kelli Egolf [phone]([id number]) on 03/06/2021 9:02:08 AM  Radiology CT HEAD WO CONTRAST  Result Date: 03/06/2021 CLINICAL DATA:  Dizziness beginning this morning.  Possible stroke. EXAM: CT HEAD WITHOUT CONTRAST TECHNIQUE: Contiguous axial images were obtained from the base of the skull through the vertex without intravenous contrast. COMPARISON:  02/11/2018 FINDINGS: Brain: Ventricles, cisterns and other CSF spaces are within normal. There is minimal stable age related atrophy. No mass, mass effect, shift of midline structures or acute hemorrhage. No evidence of acute infarction. Vascular: No hyperdense vessel or unexpected calcification. Skull: Stable ill-defined lucent lesion over the left frontal skull. No other focal bony abnormalities. Sinuses/Orbits: No acute finding. Other: None. IMPRESSION: No acute findings. Electronically Signed   By:  Elberta Fortis M.D.   On: 03/06/2021 11:28   MR Brain Wo Contrast (neuro protocol)  Result Date: 03/06/2021 CLINICAL DATA:  Dizziness EXAM: MRI HEAD WITHOUT CONTRAST TECHNIQUE: Multiplanar, multiecho  pulse sequences of the brain and surrounding structures were obtained without intravenous contrast. COMPARISON:  December 2019 FINDINGS: Brain: There is no acute infarction or intracranial hemorrhage. There is no intracranial mass, mass effect, or edema. There is no hydrocephalus or extra-axial fluid collection. Prominence of the ventricles and sulci reflects mild generalized parenchymal volume loss. Patchy foci of T2 hyperintensity in the supratentorial white matter are nonspecific but may reflect mild chronic microvascular ischemic changes. No substantial progression since the prior study. Vascular: Major vessel flow voids at the skull base are preserved. Skull and upper cervical spine: Unchanged benign and nonaggressive T2 hyperintense lesion of the left frontal calvarium. Normal marrow signal is otherwise preserved. Partially imaged susceptibility artifact related to cervical spine fusion. Sinuses/Orbits: Minor paranasal sinus mucosal thickening. No significant orbital abnormality. Other: Sella is unremarkable. Chronic fluid opacification of the right mastoid tip. IMPRESSION: No evidence of recent infarction, hemorrhage, or mass. Mild chronic microvascular ischemic changes. Electronically Signed   By: Guadlupe Spanish M.D.   On: 03/06/2021 12:58    Procedures Procedures   CRITICAL CARE Performed by: Vanetta Mulders Total critical care time: 35 minutes Critical care time was exclusive of separately billable procedures and treating other patients. Critical care was necessary to treat or prevent imminent or life-threatening deterioration. Critical care was time spent personally by me on the following activities: development of treatment plan with patient and/or surrogate as well as nursing, discussions with consultants, evaluation of patient's response to treatment, examination of patient, obtaining history from patient or surrogate, ordering and performing treatments and interventions, ordering and review  of laboratory studies, ordering and review of radiographic studies, pulse oximetry and re-evaluation of patient's condition.   Medications Ordered in ED Medications  ondansetron (ZOFRAN) injection 4 mg (4 mg Intravenous Given 03/06/21 1352)  meclizine (ANTIVERT) tablet 25 mg (25 mg Oral Given 03/06/21 1502)    ED Course  I have reviewed the triage vital signs and the nursing notes.  Pertinent labs & imaging results that were available during my care of the patient were reviewed by me and considered in my medical decision making (see chart for details).    MDM Rules/Calculators/A&P                          No reproducible vertigo here.  So based on that did head CT which was negative.  An MRI to rule out any subtle infarct or tumor.  It was negative.  Patient still having symptoms if she sits up.  But overall improved.  Labs without significant abnormalities.  COVID testing negative no evidence of urinary tract infection.  Potassium was elevated at 5.3 no renal insufficiency.  But they said it was hemolysis.  Liver function tests normal. Final Clinical Impression(s) / ED Diagnoses Final diagnoses:  Vertigo    Rx / DC Orders ED Discharge Orders          Ordered    meclizine (ANTIVERT) 25 MG tablet  3 times daily PRN        03/06/21 1529    ondansetron (ZOFRAN ODT) 4 MG disintegrating tablet  Every 8 hours PRN        03/06/21 1529             Vanetta Mulders, MD  03/06/21 1541  

## 2021-03-06 NOTE — ED Triage Notes (Signed)
BIBA Per EMS: Pt coming from home with complaints of sweating, dizziness and nausea upon wakening. Pt received 4th COVID booster last week. 108/70 BP- 250cc fluids given en route. 138/78 BP now. 129 CBG.  18 G L AC

## 2021-03-06 NOTE — Discharge Instructions (Addendum)
Work-up here today consistent with vertigo.  MRI brain without any acute findings.  Make an appointment to follow-up with ear nose and throat Dr. Suszanne Conners and a new appointment with [facility] neurology.  Take the Antivert as directed.  Take Zofran for nausea and vomiting.  Hopefully the Antivert will help subside the symptoms so that she can function better.  Be careful.  Make sure you do not put yourself in a position where he could fall.  Return for any new or worse symptoms.

## 2021-03-09 ENCOUNTER — Other Ambulatory Visit: Payer: Self-pay | Admitting: Family Medicine

## 2021-03-09 DIAGNOSIS — Z1231 Encounter for screening mammogram for malignant neoplasm of breast: Secondary | ICD-10-CM

## 2021-03-10 DIAGNOSIS — H401121 Primary open-angle glaucoma, left eye, mild stage: Secondary | ICD-10-CM | POA: Diagnosis not present

## 2021-03-10 DIAGNOSIS — Z79899 Other long term (current) drug therapy: Secondary | ICD-10-CM | POA: Diagnosis not present

## 2021-03-10 DIAGNOSIS — H401113 Primary open-angle glaucoma, right eye, severe stage: Secondary | ICD-10-CM | POA: Diagnosis not present

## 2021-03-11 DIAGNOSIS — Z298 Encounter for other specified prophylactic measures: Secondary | ICD-10-CM | POA: Diagnosis not present

## 2021-03-31 ENCOUNTER — Other Ambulatory Visit: Payer: Self-pay

## 2021-03-31 ENCOUNTER — Encounter (INDEPENDENT_AMBULATORY_CARE_PROVIDER_SITE_OTHER): Payer: Self-pay | Admitting: Family Medicine

## 2021-03-31 ENCOUNTER — Ambulatory Visit (INDEPENDENT_AMBULATORY_CARE_PROVIDER_SITE_OTHER): Payer: Medicare PPO | Admitting: Family Medicine

## 2021-03-31 VITALS — BP 95/55 | HR 72 | Temp 98.1°F | Ht 63.0 in | Wt 139.0 lb

## 2021-03-31 DIAGNOSIS — Z6830 Body mass index (BMI) 30.0-30.9, adult: Secondary | ICD-10-CM | POA: Diagnosis not present

## 2021-03-31 DIAGNOSIS — E559 Vitamin D deficiency, unspecified: Secondary | ICD-10-CM | POA: Diagnosis not present

## 2021-03-31 DIAGNOSIS — E8881 Metabolic syndrome: Secondary | ICD-10-CM | POA: Diagnosis not present

## 2021-03-31 DIAGNOSIS — E88819 Insulin resistance, unspecified: Secondary | ICD-10-CM

## 2021-03-31 DIAGNOSIS — E669 Obesity, unspecified: Secondary | ICD-10-CM | POA: Diagnosis not present

## 2021-03-31 MED ORDER — VITAMIN D 50 MCG (2000 UT) PO CAPS: 1.0000 | ORAL_CAPSULE | Freq: Every day | ORAL | 0 refills | Status: AC

## 2021-04-01 LAB — VITAMIN D 25 HYDROXY (VIT D DEFICIENCY, FRACTURES): Vit D, 25-Hydroxy: 51.2 ng/mL (ref 30.0–100.0)

## 2021-04-01 LAB — HEMOGLOBIN A1C
Est. average glucose Bld gHb Est-mCnc: 117 mg/dL
Hgb A1c MFr Bld: 5.7 % — ABNORMAL HIGH (ref 4.8–5.6)

## 2021-04-06 NOTE — Progress Notes (Signed)
Chief Complaint:   OBESITY Maureen Elliott is here to discuss her progress with her obesity treatment plan along with follow-up of her obesity related diagnoses. Maureen Elliott is on the Category 2 Plan + 200 calories or keeping a food journal and adhering to recommended goals of 1200-1400 calories and 80 grams of protein daily and states she is following her eating plan approximately 0% of the time. Maureen Elliott states she is doing 0 minutes 0 times per week.  Today's visit was #: 17 Starting weight: 171 lbs Starting date: 04/28/2020 Today's weight: 139 lbs Today's date: 03/31/2021 Total lbs lost to date: 32 Total lbs lost since last in-office visit: 1  Interim History: Maureen Elliott is going to [place] in 4 days for one week. She has maintained her weight pretty well-she is at goal weight. She is mostly practicing portion control and smarter choices. She is weighing daily and adjusting her eating accordingly. She has not exercised since she had an episode of vertigo for which she went to the emergency department.  Subjective:   1. Vitamin D deficiency Maureen Elliott's last Vit D level was at goal at 76.1. She is on OTC Vit D, but she is unsure of the dose.   Lab Results  Component Value Date   VD25OH 51.2 03/31/2021   VD25OH 76.1 11/09/2020   VD25OH 65.0 08/05/2020   2. Insulin resistance Maureen Elliott's A1c was in pre-diabetes range at the start of the program (5.8). Her last A1c was 5.5. She is not on metformin.   Lab Results  Component Value Date   INSULIN 2.8 11/09/2020   INSULIN 5.1 08/05/2020   INSULIN 7.6 04/28/2020   Lab Results  Component Value Date   HGBA1C 5.7 (H) 03/31/2021   Assessment/Plan:   1. Vitamin D deficiency We will check labs today, and we will refill Vitamin D for 1 month. Maureen Elliott will follow-up for routine testing of Vitamin D, at least 2-3 times per year to avoid over-replacement.  - Cholecalciferol (VITAMIN D) 50 MCG (2000 UT) CAPS; Take 1 capsule (2,000 Units total) by mouth daily.   Dispense: 30 capsule; Refill: 0 - VITAMIN D 25 Hydroxy (Vit-D Deficiency, Fractures)  2. Insulin resistance  We will check labs today. Maureen Elliott agreed to follow-up with [place] as directed to closely monitor her progress.  - Hemoglobin A1c  3. Overweight: Current BMI 24.63 Maureen Elliott is currently in the action stage of change. As such, her goal is to continue with weight loss efforts. She has agreed to practicing portion control and making smarter food choices, such as increasing vegetables and decreasing simple carbohydrates.   Exercise goals: She is to restart exercise after her trip to [place].  Behavioral modification strategies: increasing lean protein intake and decreasing simple carbohydrates.  Maureen Elliott has agreed to follow-up with our clinic in 6 weeks.  Maureen Elliott was informed we would discuss her lab results at her next visit unless there is a critical issue that needs to be addressed sooner. Maureen Elliott agreed to keep her next visit at the agreed upon time to discuss these results.  Objective:   Blood pressure (!) 95/55, pulse 72, temperature 98.1 F (36.7 C), height 5\' 3"  (1.6 m), weight 139 lb (63 kg), SpO2 94 %. Body mass index is 24.62 kg/m.  General: Cooperative, alert, well developed, in no acute distress. HEENT: Conjunctivae and lids unremarkable. Cardiovascular: Regular rhythm.  Lungs: Normal work of breathing. Neurologic: No focal deficits.   Lab Results  Component Value Date   CREATININE 0.95 03/06/2021  BUN 27 (H) 03/06/2021   NA 137 03/06/2021   K 5.3 (H) 03/06/2021   CL 105 03/06/2021   CO2 26 03/06/2021   Lab Results  Component Value Date   ALT 16 03/06/2021   AST 33 03/06/2021   ALKPHOS 50 03/06/2021   BILITOT 0.8 03/06/2021   Lab Results  Component Value Date   HGBA1C 5.7 (H) 03/31/2021   HGBA1C 5.5 11/09/2020   HGBA1C 5.8 (H) 08/05/2020   HGBA1C 5.7 (H) 04/28/2020   Lab Results  Component Value Date   INSULIN 2.8 11/09/2020   INSULIN 5.1 08/05/2020    INSULIN 7.6 04/28/2020   Lab Results  Component Value Date   TSH 1.560 04/28/2020   Lab Results  Component Value Date   CHOL 153 11/09/2020   HDL 54 11/09/2020   LDLCALC 88 11/09/2020   TRIG 50 11/09/2020   Lab Results  Component Value Date   VD25OH 51.2 03/31/2021   VD25OH 76.1 11/09/2020   VD25OH 65.0 08/05/2020   Lab Results  Component Value Date   WBC 7.6 03/06/2021   HGB 12.4 03/06/2021   HCT 37.7 03/06/2021   MCV 94.3 03/06/2021   PLT 202 03/06/2021   No results found for: IRON, TIBC, FERRITIN  Obesity Behavioral Intervention:   Approximately 15 minutes were spent on the discussion below.  ASK: We discussed the diagnosis of obesity with Maureen Elliott today and Maureen Elliott agreed to give [place] permission to discuss obesity behavioral modification therapy today.  ASSESS: Maureen Elliott has the diagnosis of obesity and her BMI today is 24.63. Maureen Elliott is in the action stage of change.   ADVISE: Maureen Elliott was educated on the multiple health risks of obesity as well as the benefit of weight loss to improve her health. She was advised of the need for long term treatment and the importance of lifestyle modifications to improve her current health and to decrease her risk of future health problems.  AGREE: Multiple dietary modification options and treatment options were discussed and Maureen Elliott agreed to follow the recommendations documented in the above note.  ARRANGE: Maureen Elliott was educated on the importance of frequent visits to treat obesity as outlined per CMS and USPSTF guidelines and agreed to schedule her next follow up appointment today.  Attestation Statements:   Reviewed by clinician on day of visit: allergies, medications, problem list, medical history, surgical history, family history, social history, and previous encounter notes.   Trude Mcburney, am acting as Energy manager for [place], FNP-C.  I have reviewed the above documentation for accuracy and completeness, and I agree with  the above. -  Jesse Sans, FNP

## 2021-04-07 ENCOUNTER — Encounter (INDEPENDENT_AMBULATORY_CARE_PROVIDER_SITE_OTHER): Payer: Self-pay | Admitting: Family Medicine

## 2021-04-07 DIAGNOSIS — E8881 Metabolic syndrome: Secondary | ICD-10-CM | POA: Insufficient documentation

## 2021-04-07 DIAGNOSIS — E88819 Insulin resistance, unspecified: Secondary | ICD-10-CM | POA: Insufficient documentation

## 2021-05-03 ENCOUNTER — Other Ambulatory Visit: Payer: Self-pay

## 2021-05-03 ENCOUNTER — Ambulatory Visit
Admission: RE | Admit: 2021-05-03 | Discharge: 2021-05-03 | Disposition: A | Discharge status: Home / Self Care | Payer: Medicare PPO | Source: Ambulatory Visit | Attending: Family Medicine | Admitting: Family Medicine

## 2021-05-03 DIAGNOSIS — Z1231 Encounter for screening mammogram for malignant neoplasm of breast: Secondary | ICD-10-CM

## 2021-05-12 ENCOUNTER — Encounter (INDEPENDENT_AMBULATORY_CARE_PROVIDER_SITE_OTHER): Payer: Self-pay | Admitting: Family Medicine

## 2021-05-12 ENCOUNTER — Ambulatory Visit (INDEPENDENT_AMBULATORY_CARE_PROVIDER_SITE_OTHER): Payer: Medicare PPO | Admitting: Family Medicine

## 2021-05-12 ENCOUNTER — Other Ambulatory Visit: Payer: Self-pay

## 2021-05-12 VITALS — BP 114/70 | HR 66 | Temp 98.4°F | Ht 63.0 in | Wt 140.0 lb

## 2021-05-12 DIAGNOSIS — E7849 Other hyperlipidemia: Secondary | ICD-10-CM | POA: Diagnosis not present

## 2021-05-12 DIAGNOSIS — E669 Obesity, unspecified: Secondary | ICD-10-CM

## 2021-05-12 DIAGNOSIS — Z6830 Body mass index (BMI) 30.0-30.9, adult: Secondary | ICD-10-CM

## 2021-05-16 NOTE — Progress Notes (Signed)
Chief Complaint:   OBESITY Maureen Elliott is here to discuss her progress with her obesity treatment plan along with follow-up of her obesity related diagnoses. Maureen Elliott is on the Category 2 Plan, the Category 3 Plan, and practicing portion control and making smarter food choices, such as increasing vegetables and decreasing simple carbohydrates and states she is following her eating plan approximately 90% of the time. Maureen Elliott states she is doing Comptroller for 45 minutes 5 times per week.  Today's visit was #: 18 Starting weight: 171 lbs Starting date: 04/28/2020 Today's weight: 140 lbs Today's date:05/12/2021 Total lbs lost to date: 31 lbs Total lbs lost since last in-office visit: 0  Interim History: Maureen Elliott has been working on maintaining her weight between 138 lbs - 142 lbs. Her weight today is 140 lbs despite going on 2 recent trips. She is eating adequate protein. She is eating 3 meals and one snack per day. Her hunger is satisfied.  Subjective:   1. Other hyperlipidemia Maureen Elliott stopped her Atorvastatin several months ago because her triglycerides, HDL and LDL were within normal limits.  Lab Results  Component Value Date   CHOL 153 11/09/2020   HDL 54 11/09/2020   LDLCALC 88 11/09/2020   TRIG 50 11/09/2020   Lab Results  Component Value Date   ALT 16 03/06/2021   AST 33 03/06/2021   ALKPHOS 50 03/06/2021   BILITOT 0.8 03/06/2021   The 10-year ASCVD risk score Denman George DC Jr., et al., 2013) is: 11.3%   Values used to calculate the score:     Age: 75 years     Sex: Female     Is Non-Hispanic African American: No     Diabetic: No     Tobacco smoker: No     Systolic Blood Pressure: 114 mmHg     Is BP treated: No     HDL Cholesterol: 54 mg/dL     Total Cholesterol: 153 mg/dL   Assessment/Plan:   1. Other hyperlipidemia She will ask her PCP to check FLP at next office visit.   2. Overweight: Current BMI 24.81 Maureen Elliott is currently in the action stage of change. As  such, her goal is to maintain weight for now. She has agreed to practicing portion control and making smarter food choices, such as increasing vegetables and decreasing simple carbohydrates.   Maureen Elliott is exercising regularly. She is doing a great job with eating and exercise.  Exercise goals:  As is.  Behavioral modification strategies: planning for success.  Maureen Elliott has agreed to follow-up with our clinic in 8 weeks. She was informed of the importance of frequent follow-up visits to maximize her success with intensive lifestyle modifications for her multiple health conditions.   Objective:   Blood pressure 114/70, pulse 66, temperature 98.4 F (36.9 C), height 5\' 3"  (1.6 m), weight 140 lb (63.5 kg), SpO2 97 %. Body mass index is 24.8 kg/m.  General: Cooperative, alert, well developed, in no acute distress. HEENT: Conjunctivae and lids unremarkable. Cardiovascular: Regular rhythm.  Lungs: Normal work of breathing. Neurologic: No focal deficits.   Lab Results  Component Value Date   CREATININE 0.95 03/06/2021   BUN 27 (H) 03/06/2021   NA 137 03/06/2021   K 5.3 (H) 03/06/2021   CL 105 03/06/2021   CO2 26 03/06/2021   Lab Results  Component Value Date   ALT 16 03/06/2021   AST 33 03/06/2021   ALKPHOS 50 03/06/2021   BILITOT 0.8 03/06/2021  Lab Results  Component Value Date   HGBA1C 5.7 (H) 03/31/2021   HGBA1C 5.5 11/09/2020   HGBA1C 5.8 (H) 08/05/2020   HGBA1C 5.7 (H) 04/28/2020   Lab Results  Component Value Date   INSULIN 2.8 11/09/2020   INSULIN 5.1 08/05/2020   INSULIN 7.6 04/28/2020   Lab Results  Component Value Date   TSH 1.560 04/28/2020   Lab Results  Component Value Date   CHOL 153 11/09/2020   HDL 54 11/09/2020   LDLCALC 88 11/09/2020   TRIG 50 11/09/2020   Lab Results  Component Value Date   VD25OH 51.2 03/31/2021   VD25OH 76.1 11/09/2020   VD25OH 65.0 08/05/2020   Lab Results  Component Value Date   WBC 7.6 03/06/2021   HGB 12.4  03/06/2021   HCT 37.7 03/06/2021   MCV 94.3 03/06/2021   PLT 202 03/06/2021   No results found for: IRON, TIBC, FERRITIN  Obesity Behavioral Intervention:   Approximately 15 minutes were spent on the discussion below.  ASK: We discussed the diagnosis of obesity with Maureen Elliott today and Maureen Elliott agreed to give [place] permission to discuss obesity behavioral modification therapy today.  ASSESS: Maureen Elliott has the diagnosis of obesity and her BMI today is 24.9. Maureen Elliott is in the action stage of change.   ADVISE: Maureen Elliott was educated on the multiple health risks of obesity as well as the benefit of weight loss to improve her health. She was advised of the need for long term treatment and the importance of lifestyle modifications to improve her current health and to decrease her risk of future health problems.  AGREE: Multiple dietary modification options and treatment options were discussed and Maureen Elliott agreed to follow the recommendations documented in the above note.  ARRANGE: Maureen Elliott was educated on the importance of frequent visits to treat obesity as outlined per CMS and USPSTF guidelines and agreed to schedule her next follow up appointment today.  Attestation Statements:   Reviewed by clinician on day of visit: allergies, medications, problem list, medical history, surgical history, family history, social history, and previous encounter notes.  I, Jackson Latino, RMA, am acting as Energy manager for [place], FNP.   I have reviewed the above documentation for accuracy and completeness, and I agree with the above. -  Jesse Sans, FNP

## 2021-05-19 DIAGNOSIS — L821 Other seborrheic keratosis: Secondary | ICD-10-CM | POA: Diagnosis not present

## 2021-05-19 DIAGNOSIS — B078 Other viral warts: Secondary | ICD-10-CM | POA: Diagnosis not present

## 2021-06-13 DIAGNOSIS — H401113 Primary open-angle glaucoma, right eye, severe stage: Secondary | ICD-10-CM | POA: Diagnosis not present

## 2021-06-13 DIAGNOSIS — H401121 Primary open-angle glaucoma, left eye, mild stage: Secondary | ICD-10-CM | POA: Diagnosis not present

## 2021-06-20 DIAGNOSIS — R0982 Postnasal drip: Secondary | ICD-10-CM | POA: Diagnosis not present

## 2021-06-20 DIAGNOSIS — J343 Hypertrophy of nasal turbinates: Secondary | ICD-10-CM | POA: Diagnosis not present

## 2021-06-20 DIAGNOSIS — H903 Sensorineural hearing loss, bilateral: Secondary | ICD-10-CM | POA: Diagnosis not present

## 2021-06-20 DIAGNOSIS — J310 Chronic rhinitis: Secondary | ICD-10-CM | POA: Diagnosis not present

## 2021-07-07 ENCOUNTER — Other Ambulatory Visit: Payer: Self-pay

## 2021-07-07 ENCOUNTER — Encounter (INDEPENDENT_AMBULATORY_CARE_PROVIDER_SITE_OTHER): Payer: Self-pay | Admitting: Bariatrics

## 2021-07-07 ENCOUNTER — Ambulatory Visit (INDEPENDENT_AMBULATORY_CARE_PROVIDER_SITE_OTHER): Payer: Medicare PPO | Admitting: Bariatrics

## 2021-07-07 VITALS — BP 121/67 | HR 67 | Temp 98.0°F | Ht 63.0 in | Wt 140.0 lb

## 2021-07-07 DIAGNOSIS — E7800 Pure hypercholesterolemia, unspecified: Secondary | ICD-10-CM

## 2021-07-07 DIAGNOSIS — R7303 Prediabetes: Secondary | ICD-10-CM

## 2021-07-07 DIAGNOSIS — Z6830 Body mass index (BMI) 30.0-30.9, adult: Secondary | ICD-10-CM | POA: Diagnosis not present

## 2021-07-07 DIAGNOSIS — E669 Obesity, unspecified: Secondary | ICD-10-CM | POA: Diagnosis not present

## 2021-07-07 NOTE — Progress Notes (Signed)
Chief Complaint:   OBESITY Maureen Elliott is here to discuss her progress with her obesity treatment plan along with follow-up of her obesity related diagnoses. Ellyn is on practicing portion control and making smarter food choices, such as increasing vegetables and decreasing simple carbohydrates and states she is following her eating plan approximately 90% of the time. Marveen states she is doing Geophysicist/field seismologist for 45 minutes 5 times per week.  Today's visit was #: 19 Starting weight: 171 lbs Starting date: 04/28/2020 Today's weight: 140 lbs Today's date: 07/07/2021 Total lbs lost to date: 31 lbs Total lbs lost since last in-office visit: 0  Interim History: Amaziah's weight remain the same, but she has done well overall. She is on a maintenance plan.   Subjective:   1. Hypercholesterolemia Bobette is currently taking Fish Oil.  2. Prediabetes Anarie is not on medications currently.  Assessment/Plan:   1. Hypercholesterolemia Cardiovascular risk and specific lipid/LDL goals reviewed.  We discussed several lifestyle modifications today and Jet will continue to work on diet, exercise and weight loss efforts. Lailany will continue Fish Oil. She will have zero trans fats and decrease saturated fat.Orders and follow up as documented in patient record.   Counseling Intensive lifestyle modifications are the first line treatment for this issue. Dietary changes: Increase soluble fiber. Decrease simple carbohydrates. Exercise changes: Moderate to vigorous-intensity aerobic activity 150 minutes per week if tolerated. Lipid-lowering medications: see documented in medical record.   2. Prediabetes Millee will continue to work on weight loss, exercise, and decreasing simple carbohydrates to help decrease the risk of diabetes. She will increase healthy fats and protein.  3. Overweight: Current BMI 24.9 Appollonia is currently in the action stage of change. As such, her goal is to continue with weight loss  efforts. She has agreed to practicing portion control and making smarter food choices, such as increasing vegetables and decreasing simple carbohydrates.   Shantella will continue meal planning and intentional eating. She will continue on a maintenance plan.   Exercise goals:  Adiana will continue Silver Sneakers with bands and balls.   Behavioral modification strategies: increasing lean protein intake, decreasing simple carbohydrates, increasing vegetables, increasing water intake, decreasing eating out, no skipping meals, meal planning and cooking strategies, keeping healthy foods in the home, and planning for success.  Jayda has agreed to follow-up with our clinic in 2 months with Dr. Lawson Radar or Adah Salvage, FNP. She was informed of the importance of frequent follow-up visits to maximize her success with intensive lifestyle modifications for her multiple health conditions.   Objective:   Blood pressure 121/67, pulse 67, temperature 98 F (36.7 C), height 5\' 3"  (1.6 m), weight 140 lb (63.5 kg), SpO2 98 %. Body mass index is 24.8 kg/m.  General: Cooperative, alert, well developed, in no acute distress. HEENT: Conjunctivae and lids unremarkable. Cardiovascular: Regular rhythm.  Lungs: Normal work of breathing. Neurologic: No focal deficits.   Lab Results  Component Value Date   CREATININE 0.95 03/06/2021   BUN 27 (H) 03/06/2021   NA 137 03/06/2021   K 5.3 (H) 03/06/2021   CL 105 03/06/2021   CO2 26 03/06/2021   Lab Results  Component Value Date   ALT 16 03/06/2021   AST 33 03/06/2021   ALKPHOS 50 03/06/2021   BILITOT 0.8 03/06/2021   Lab Results  Component Value Date   HGBA1C 5.7 (H) 03/31/2021   HGBA1C 5.5 11/09/2020   HGBA1C 5.8 (H) 08/05/2020   HGBA1C 5.7 (H) 04/28/2020  Lab Results  Component Value Date   INSULIN 2.8 11/09/2020   INSULIN 5.1 08/05/2020   INSULIN 7.6 04/28/2020   Lab Results  Component Value Date   TSH 1.560 04/28/2020   Lab Results  Component  Value Date   CHOL 153 11/09/2020   HDL 54 11/09/2020   LDLCALC 88 11/09/2020   TRIG 50 11/09/2020   Lab Results  Component Value Date   VD25OH 51.2 03/31/2021   VD25OH 76.1 11/09/2020   VD25OH 65.0 08/05/2020   Lab Results  Component Value Date   WBC 7.6 03/06/2021   HGB 12.4 03/06/2021   HCT 37.7 03/06/2021   MCV 94.3 03/06/2021   PLT 202 03/06/2021   No results found for: IRON, TIBC, FERRITIN  Obesity Behavioral Intervention:   Approximately 15 minutes were spent on the discussion below.  ASK: We discussed the diagnosis of obesity with [name] today and Kimyetta agreed to give [place] permission to discuss obesity behavioral modification therapy today.  ASSESS: Tarina has the diagnosis of obesity and her BMI today is 24.9. Doral is in the action stage of change.   ADVISE: Lizet was educated on the multiple health risks of obesity as well as the benefit of weight loss to improve her health. She was advised of the need for long term treatment and the importance of lifestyle modifications to improve her current health and to decrease her risk of future health problems.  AGREE: Multiple dietary modification options and treatment options were discussed and Cylah agreed to follow the recommendations documented in the above note.  ARRANGE: Kieley was educated on the importance of frequent visits to treat obesity as outlined per CMS and USPSTF guidelines and agreed to schedule her next follow up appointment today.  Attestation Statements:   Reviewed by clinician on day of visit: allergies, medications, problem list, medical history, surgical history, family history, social history, and previous encounter notes.  I, Jackson Latino, RMA, am acting as Energy manager for [employer], DO.   I have reviewed the above documentation for accuracy and completeness, and I agree with the above. Corinna Capra, DO

## 2021-07-12 ENCOUNTER — Encounter (INDEPENDENT_AMBULATORY_CARE_PROVIDER_SITE_OTHER): Payer: Self-pay | Admitting: Bariatrics

## 2021-08-05 DIAGNOSIS — U071 COVID-19: Secondary | ICD-10-CM | POA: Diagnosis not present

## 2021-08-05 DIAGNOSIS — R059 Cough, unspecified: Secondary | ICD-10-CM | POA: Diagnosis not present

## 2021-09-12 ENCOUNTER — Emergency Department (HOSPITAL_BASED_OUTPATIENT_CLINIC_OR_DEPARTMENT_OTHER)
Admission: EM | Admit: 2021-09-12 | Discharge: 2021-09-12 | Disposition: A | Discharge status: Home / Self Care | Payer: Medicare PPO | Attending: Emergency Medicine | Admitting: Emergency Medicine

## 2021-09-12 ENCOUNTER — Encounter (HOSPITAL_BASED_OUTPATIENT_CLINIC_OR_DEPARTMENT_OTHER): Payer: Self-pay

## 2021-09-12 ENCOUNTER — Encounter (INDEPENDENT_AMBULATORY_CARE_PROVIDER_SITE_OTHER): Payer: Self-pay | Admitting: Bariatrics

## 2021-09-12 ENCOUNTER — Emergency Department (HOSPITAL_BASED_OUTPATIENT_CLINIC_OR_DEPARTMENT_OTHER): Payer: Medicare PPO

## 2021-09-12 ENCOUNTER — Ambulatory Visit (INDEPENDENT_AMBULATORY_CARE_PROVIDER_SITE_OTHER): Payer: Medicare PPO | Admitting: Bariatrics

## 2021-09-12 ENCOUNTER — Other Ambulatory Visit: Payer: Self-pay

## 2021-09-12 VITALS — BP 118/60 | HR 79 | Temp 98.0°F | Ht 63.0 in | Wt 142.0 lb

## 2021-09-12 DIAGNOSIS — E669 Obesity, unspecified: Secondary | ICD-10-CM | POA: Diagnosis not present

## 2021-09-12 DIAGNOSIS — R0789 Other chest pain: Secondary | ICD-10-CM

## 2021-09-12 DIAGNOSIS — E7849 Other hyperlipidemia: Secondary | ICD-10-CM

## 2021-09-12 DIAGNOSIS — R059 Cough, unspecified: Secondary | ICD-10-CM | POA: Diagnosis not present

## 2021-09-12 DIAGNOSIS — R7303 Prediabetes: Secondary | ICD-10-CM | POA: Diagnosis not present

## 2021-09-12 DIAGNOSIS — R42 Dizziness and giddiness: Secondary | ICD-10-CM

## 2021-09-12 DIAGNOSIS — R079 Chest pain, unspecified: Secondary | ICD-10-CM | POA: Diagnosis not present

## 2021-09-12 DIAGNOSIS — I4891 Unspecified atrial fibrillation: Secondary | ICD-10-CM | POA: Insufficient documentation

## 2021-09-12 DIAGNOSIS — Z6830 Body mass index (BMI) 30.0-30.9, adult: Secondary | ICD-10-CM

## 2021-09-12 LAB — BASIC METABOLIC PANEL
Anion gap: 8 (ref 5–15)
BUN: 19 mg/dL (ref 8–23)
CO2: 29 mmol/L (ref 22–32)
Calcium: 9.5 mg/dL (ref 8.9–10.3)
Chloride: 105 mmol/L (ref 98–111)
Creatinine, Ser: 0.74 mg/dL (ref 0.44–1.00)
GFR, Estimated: >60 mL/min (ref >60)
Glucose, Bld: 100 mg/dL — ABNORMAL HIGH (ref 70–99)
Potassium: 3.7 mmol/L (ref 3.5–5.1)
Sodium: 142 mmol/L (ref 135–145)

## 2021-09-12 LAB — CBC WITH DIFFERENTIAL/PLATELET
Abs Immature Granulocytes: 0.01 10*3/uL (ref 0.00–0.07)
Basophils Absolute: 0 10*3/uL (ref 0.0–0.1)
Basophils Relative: 1 %
Eosinophils Absolute: 0.1 10*3/uL (ref 0.0–0.5)
Eosinophils Relative: 3 %
HCT: 40.9 % (ref 36.0–46.0)
Hemoglobin: 13.4 g/dL (ref 12.0–15.0)
Immature Granulocytes: 0 %
Lymphocytes Relative: 22 %
Lymphs Abs: 1 10*3/uL (ref 0.7–4.0)
MCH: 30.4 pg (ref 26.0–34.0)
MCHC: 32.8 g/dL (ref 30.0–36.0)
MCV: 92.7 fL (ref 80.0–100.0)
Monocytes Absolute: 0.4 10*3/uL (ref 0.1–1.0)
Monocytes Relative: 9 %
Neutro Abs: 3.1 10*3/uL (ref 1.7–7.7)
Neutrophils Relative %: 65 %
Platelets: 230 10*3/uL (ref 150–400)
RBC: 4.41 MIL/uL (ref 3.87–5.11)
RDW: 14.3 % (ref 11.5–15.5)
WBC: 4.7 10*3/uL (ref 4.0–10.5)
nRBC: 0 % (ref 0.0–0.2)

## 2021-09-12 LAB — MAGNESIUM: Magnesium: 1.9 mg/dL (ref 1.7–2.4)

## 2021-09-12 LAB — TROPONIN I (HIGH SENSITIVITY): Troponin I (High Sensitivity): 3 ng/L (ref <18)

## 2021-09-12 NOTE — Progress Notes (Signed)
Chief Complaint:   OBESITY Maureen Elliott is here to discuss her progress with her obesity treatment plan along with follow-up of her obesity related diagnoses. Maureen Elliott is on practicing portion control and making smarter food choices, such as increasing vegetables and decreasing simple carbohydrates and states she is following her eating plan approximately 80-85% of the time. Maureen Elliott states she is gym exercise and silver sneakers for 45 minutes 4 times per week.  Today's visit was #: 20 Starting weight: 171 lbs Starting date: 04/28/2020 Today's weight: 142 lbs Today's date: 09/12/2021 Total lbs lost to date: 29 lbs Total lbs lost since last in-office visit: 0  Interim History: Maureen Elliott is up 2 lbs since her last visit. She went on a cruise in September and caught Covid (no antiviral) and has been on albuterol.   Subjective:   1. Other hyperlipidemia Maureen Elliott is currently taking Fish oil.  2. Pre-diabetes Maureen Elliott is currently not on medications.   Assessment/Plan:   1. Other hyperlipidemia Cardiovascular risk and specific lipid/LDL goals reviewed.  Maureen Elliott will continue taking Fish oil pills. She will keep unhealthy saturated fats to a minimum. We discussed several lifestyle modifications today and Maureen Elliott will continue to work on diet, exercise and weight loss efforts. Orders and follow up as documented in patient record.   Counseling Intensive lifestyle modifications are the first line treatment for this issue. Dietary changes: Increase soluble fiber. Decrease simple carbohydrates. Exercise changes: Moderate to vigorous-intensity aerobic activity 150 minutes per week if tolerated. Lipid-lowering medications: see documented in medical record.  2. Pre-diabetes Maureen Elliott will continue to work on weight loss, exercise, and decreasing simple carbohydrates to help decrease the risk of diabetes. She will keep calories to 1,000-1,100.   3. Obesity, current BMI 25.2 Maureen Elliott is currently in the action stage of  change. As such, her goal is to continue with weight loss efforts. She has agreed to keeping a food journal and adhering to recommended goals of [phone] calories and 70-80 grams of protein and practicing portion control and making smarter food choices, such as increasing vegetables and decreasing simple carbohydrates.   Maureen Elliott will continue meal planning. She will keep calories 1000-1100 and 70-80 grams of protein. She will go to the [facility] for an evaluation and she will call our office after she finishes there.   Exercise goals:  As is.  Behavioral modification strategies: increasing lean protein intake, decreasing simple carbohydrates, increasing vegetables, increasing water intake, decreasing eating out, no skipping meals, meal planning and cooking strategies, keeping healthy foods in the home, and planning for success.  Maureen Elliott has agreed to follow-up with our clinic in 3-4 weeks. She was informed of the importance of frequent follow-up visits to maximize her success with intensive lifestyle modifications for her multiple health conditions.   Objective:   Blood pressure 118/60, pulse 79, temperature 98 F (36.7 C), height 5\' 3"  (1.6 m), weight 142 lb (64.4 kg), SpO2 100 %. Body mass index is 25.15 kg/m. Maureen Elliott's lungs has no rales, rhonchi or wheezing. Her heart-regular rate and rhythm. She has no palpations or irregularly.   General: Cooperative, alert, well developed, in no acute distress. HEENT: Conjunctivae and lids unremarkable. Cardiovascular: Regular rhythm.  Lungs: Normal work of breathing. Neurologic: No focal deficits.   Lab Results  Component Value Date   CREATININE 0.74 09/12/2021   BUN 19 09/12/2021   NA 142 09/12/2021   K 3.7 09/12/2021   CL 105 09/12/2021   CO2 29 09/12/2021   Lab  Results  Component Value Date   ALT 16 03/06/2021   AST 33 03/06/2021   ALKPHOS 50 03/06/2021   BILITOT 0.8 03/06/2021   Lab Results  Component Value Date   HGBA1C  5.7 (H) 03/31/2021   HGBA1C 5.5 11/09/2020   HGBA1C 5.8 (H) 08/05/2020   HGBA1C 5.7 (H) 04/28/2020   Lab Results  Component Value Date   INSULIN 2.8 11/09/2020   INSULIN 5.1 08/05/2020   INSULIN 7.6 04/28/2020   Lab Results  Component Value Date   TSH 1.560 04/28/2020   Lab Results  Component Value Date   CHOL 153 11/09/2020   HDL 54 11/09/2020   LDLCALC 88 11/09/2020   TRIG 50 11/09/2020   Lab Results  Component Value Date   VD25OH 51.2 03/31/2021   VD25OH 76.1 11/09/2020   VD25OH 65.0 08/05/2020   Lab Results  Component Value Date   WBC 4.7 09/12/2021   HGB 13.4 09/12/2021   HCT 40.9 09/12/2021   MCV 92.7 09/12/2021   PLT 230 09/12/2021   No results found for: IRON, TIBC, FERRITIN  Attestation Statements:   Reviewed by clinician on day of visit: allergies, medications, problem list, medical history, surgical history, family history, social history, and previous encounter notes.  I, Jackson Latino, RMA, am acting as Energy manager for [employer], DO.  I have reviewed the above documentation for accuracy and completeness, and I agree with the above. Corinna Capra, DO

## 2021-09-12 NOTE — ED Triage Notes (Signed)
Pt arrives ambulatory to ED with reports of dizziness and CP after having Covid in October. Pt reports CP is left upper chest, comes and goes. Was seen by her 'diet doctor today and advised to come to ED.

## 2021-09-12 NOTE — ED Notes (Signed)
Patient transported to CT 

## 2021-09-12 NOTE — Discharge Instructions (Addendum)
You were seen in the emergency department for intermittent dizziness and intermittent chest pain.  You had lab work EKG chest x-ray and a CAT scan of your head.  The only significant finding was that your EKG showed you to be in and out of atrial fibrillation.  We are putting a referral in for you to follow-up with cardiology.  We discussed starting on blood thinners and you wanted to hold off until you discuss this with cardiology.  Please return to the emergency department if any worsening or concerning symptoms.

## 2021-09-12 NOTE — ED Provider Notes (Signed)
[facility] EMERGENCY DEPARTMENT Provider Note   CSN: [id number] Arrival date & time: 09/12/21  1020     History Chief Complaint  Patient presents with   Chest Pain   Dizziness    Maureen Elliott is a 75 y.o. female.  She is here for evaluation of left-sided chest pain and dizziness.  She is not having either the symptoms now.  She has had on and off dizzy spells since she contracted COVID in October.  Occur mostly when she turns her head in bed or if she stands up quickly.  She is also had a few episodes of intermittent left-sided sharp stabbing chest pain.  These do not seem to be related to the dizzy spells.  She was at a doctor's appointment today and they told her she should come here and get it checked out.  She thinks she might have a sinus infection because she has had some nasal congestion.  No fever.  No history of cardiac disease.  The history is provided by the patient.  Chest Pain Pain location:  L chest Pain quality: stabbing   Pain radiates to:  Does not radiate Pain severity:  Moderate Onset quality:  Gradual Duration:  2 months Timing:  Sporadic Progression:  Improving Chronicity:  New Relieved by:  None tried Worsened by:  Nothing Ineffective treatments:  None tried Associated symptoms: cough and dizziness   Associated symptoms: no abdominal pain, no fever, no headache, no nausea, no shortness of breath and no vomiting   Dizziness Quality:  Room spinning Severity:  Moderate Onset quality:  Gradual Duration:  2 months Timing:  Sporadic Progression:  Unchanged Chronicity:  New Context: head movement and standing up   Relieved by:  None tried Worsened by:  Turning head Ineffective treatments:  None tried Associated symptoms: chest pain   Associated symptoms: no headaches, no nausea, no shortness of breath, no tinnitus, no vision changes and no vomiting       Past Medical History:  Diagnosis Date   Complication of anesthesia    be  careful with intubation due to previous cervical neck surgery   Cystocele    Diverticulitis    Diverticulosis    DVT (deep venous thrombosis) (HCC)    left leg   Gallbladder problem    Gestational diabetes    Glaucoma    Hemorrhoids internal and external   High cholesterol    History of gestational diabetes yrs ago   Joint pain    Lactose intolerance    Nocturia    Osteoarthritis    Partial hearing loss    PONV (postoperative nausea and vomiting) 2013   n/v after phenergan, injectable phenergan helps with nausea   Prediabetes     Patient Active Problem List   Diagnosis Date Noted   Insulin resistance 04/07/2021   Class 1 obesity with serious comorbidity and body mass index (BMI) of 30.0 to 30.9 in adult 01/31/2021   Uveitis 10/21/2019   Numbness 10/21/2019   Bilateral hearing loss 10/21/2019   Cystocele 01/13/2014    Past Surgical History:  Procedure Laterality Date   ABDOMINAL HYSTERECTOMY  2013   ovaries removed    BLADDER SURGERY     BREAST BIOPSY Bilateral 2001   cervical fusion C 5, C 6 and C 7  2004   colonscopy  feb 2015   blood in stool, just was hemorrhoids   CYSTOCELE REPAIR N/A 01/13/2014   Procedure: CYSTOSCOPY, REPAIR CYSTOCELE VAULT PROLAPSE/GRAFT;  Surgeon: Nicki Reaper  A MacDiarmid, MD;  Location: [facility];  Service: Urology;  Laterality: N/A;   EYE SURGERY Bilateral 8 yrs ago   lens replacement for cataract   GLAUCOMA SURGERY     SEPTOPLASTY Bilateral 03/01/2020   Procedure: NASAL SEPTOPLASTY;  Surgeon: Newman Pies, MD;  Location: [facility];  Service: ENT;  Laterality: Bilateral;   tummy tuck with mesh  6 yrs ago   large abdominal is present     OB History     Gravida  1   Para      Term      Preterm      AB      Living         SAB      IAB      Ectopic      Multiple      Live Births              Family History  Problem Relation Age of Onset   Breast cancer Maternal Aunt    Breast cancer Maternal Aunt    Breast  cancer Maternal Aunt    Cancer Mother    Hypertension Father    Cancer Father    Alcohol abuse Father    Obesity Father     Social History   Tobacco Use   Smoking status: Never   Smokeless tobacco: Never  Vaping Use   Vaping Use: Never used  Substance Use Topics   Alcohol use: Yes    Comment: occasional wine with dinner   Drug use: No    Home Medications Prior to Admission medications   Medication Sig Start Date End Date Taking? Authorizing Provider  brimonidine (ALPHAGAN) 0.2 % ophthalmic solution Place 1 drop into both eyes 3 (three) times daily.    [provider]  CALCIUM PO Take 1 Dose by mouth daily.    [provider]  Carboxymethylcell-Hypromellose (GENTEAL OP) Apply 1 drop to eye as needed (dry eyes).    [provider]  Cholecalciferol (VITAMIN D) 50 MCG (2000 UT) CAPS Take 1 capsule (2,000 Units total) by mouth daily. 03/31/21   Whitmire, Thermon Leyland, FNP  conjugated estrogens (PREMARIN) vaginal cream Place 1 Applicatorful vaginally 2 (two) times a week.    [provider]  Glucosamine HCl (GLUCOSAMINE PO) Take 1 Dose by mouth daily.    [provider]  melatonin 5 MG TABS Take 5 mg by mouth at bedtime.    [provider]  Omega-3 Fatty Acids (FISH OIL) 1200 MG CAPS Take 1,200 mg by mouth daily.    [provider]  pilocarpine (PILOCAR) 1 % ophthalmic solution Place 1 drop into both eyes 3 (three) times daily. 04/10/20   [provider]  timolol (TIMOPTIC) 0.5 % ophthalmic solution Place 1 drop into both eyes 2 (two) times daily. 01/24/21   [provider]  Tretinoin 0.075 % CREA Apply 1 application topically at bedtime.    [provider]    Allergies    Amoxicillin, Caffeine, Dorzolamide, Keflex [cephalexin], Levaquin [levofloxacin], Maxitrol [neomycin-polymyxin-dexameth], Methotrexate derivatives, Prednisone, Restasis [cyclosporine], and Valtrex [valacyclovir]  Review of Systems    Review of Systems  Constitutional:  Negative for fever.  HENT:  Negative for sore throat and tinnitus.   Eyes:  Negative for visual disturbance.  Respiratory:  Positive for cough. Negative for shortness of breath.   Cardiovascular:  Positive for chest pain.  Gastrointestinal:  Negative for abdominal pain, nausea and vomiting.  Genitourinary:  Negative for dysuria.  Musculoskeletal:  Negative for neck pain.  Skin:  Negative for rash.  Neurological:  Positive for dizziness. Negative for headaches.   Physical Exam Updated Vital Signs BP 129/70 (BP Location: Right Arm)    Pulse 76    Temp 98.2 F (36.8 C) (Oral)    Resp 16    Ht 5\' 3"  (1.6 m)    Wt 64.4 kg    SpO2 100%    BMI 25.15 kg/m   Physical Exam Vitals and nursing note reviewed.  Constitutional:      General: She is not in acute distress.    Appearance: She is well-developed.  HENT:     Head: Normocephalic and atraumatic.  Eyes:     Extraocular Movements: Extraocular movements intact.     Conjunctiva/sclera: Conjunctivae normal.     Pupils: Pupils are equal, round, and reactive to light.     Comments: No nystagmus  Cardiovascular:     Rate and Rhythm: Normal rate and regular rhythm.     Heart sounds: No murmur heard. Pulmonary:     Effort: Pulmonary effort is normal. No respiratory distress.     Breath sounds: Normal breath sounds.  Abdominal:     Palpations: Abdomen is soft.     Tenderness: There is no abdominal tenderness.  Musculoskeletal:        General: No swelling. Normal range of motion.     Cervical back: Neck supple.     Right lower leg: No tenderness. No edema.     Left lower leg: No tenderness. No edema.  Skin:    General: Skin is warm and dry.     Capillary Refill: Capillary refill takes less than 2 seconds.  Neurological:     General: No focal deficit present.     Mental Status: She is alert.     Cranial Nerves: No cranial nerve deficit.     Motor: No weakness.     Comments: Awake alert.  No  facial asymmetry.  Fluent speech.  No numbness or weakness.  Normal gait.  Psychiatric:        Mood and Affect: Mood normal.    ED Results / Procedures / Treatments   Labs (all labs ordered are listed, but only abnormal results are displayed) Labs Reviewed  BASIC METABOLIC PANEL - Abnormal; Notable for the following components:      Result Value   Glucose, Bld 100 (*)    All other components within normal limits  CBC WITH DIFFERENTIAL/PLATELET  MAGNESIUM  TROPONIN I (HIGH SENSITIVITY)    EKG EKG Interpretation  Date/Time:  Monday September 12 2021 11:22:43 EST Ventricular Rate:  60 PR Interval:  152 QRS Duration: 104 QT Interval:  407 QTC Calculation: 407 R Axis:   14 Text Interpretation: Atrial fibrillation and sinus Low voltage, precordial leads Abnormal R-wave progression, early transition Confirmed by Meridee ScoreButler, Abdirahim Flavell [phone]) on 09/12/2021 12:10:33 PM  Radiology CT Head Wo Contrast  Result Date: 09/12/2021 CLINICAL DATA:  Dizziness, persistent/recurrent, cardiac or vascular cause suspected EXAM: CT HEAD WITHOUT CONTRAST TECHNIQUE: Contiguous axial images were obtained from the base of the skull through the vertex without intravenous contrast. COMPARISON:  03/06/2021 FINDINGS: Brain: Mild age related volume loss. No acute intracranial abnormality. Specifically, no hemorrhage, hydrocephalus, mass lesion, acute infarction, or significant intracranial injury. Vascular: No hyperdense vessel or unexpected calcification. Skull: No acute calvarial abnormality. Sinuses/Orbits: No acute findings Other: None IMPRESSION: No acute intracranial abnormality. Electronically Signed   By: Charlett NoseKevin  Dover  M.D.   On: 09/12/2021 11:18   DG Chest Port 1 View  Result Date: 09/12/2021 CLINICAL DATA:  Chest pain EXAM: PORTABLE CHEST 1 VIEW COMPARISON:  02/11/2018 FINDINGS: The heart size and mediastinal contours are within normal limits. Both lungs are clear. The visualized skeletal structures are  unremarkable. IMPRESSION: Negative. Electronically Signed   By: Rolm Baptise M.D.   On: 09/12/2021 11:17    Procedures Procedures   Medications Ordered in ED Medications - No data to display  ED Course  I have reviewed the triage vital signs and the nursing notes.  Pertinent labs & imaging results that were available during my care of the patient were reviewed by me and considered in my medical decision making (see chart for details).  Clinical Course as of 09/12/21 1948  Mon Sep 12, 2021  1205 Discussed with Dr. Harriet Masson cardiology.  She agrees that patient appears to be in intermittent A. fib.  Consideration for anticoagulation and getting set up with a monitor. [MB]  [id number] Had shared decision-making with patient regarding starting on blood thinners.  She felt she would rather see the cardiologist first and make a decision with them.  I reviewed that her [place] vas score shows her at over 4% risk of stroke yearly.  She understands this [MB]    Clinical Course User Index [MB] Hayden Rasmussen, MD   MDM Rules/Calculators/A&P                         This patient complains of episodes of dizziness which sound like vertigo, intermittent stabbing left-sided chest pain; this involves an extensive number of treatment Options and is a complaint that carries with it a high risk of complications and Morbidity. The differential includes vertigo, stroke, ACS, musculoskeletal, arrhythmia, pneumonia, metabolic derangement  I ordered, reviewed and interpreted labs, which included CBC with normal white count normal hemoglobin, chemistries normal, troponin unremarkable  I ordered imaging studies which included chest x-ray and head CT and I independently    visualized and interpreted imaging which showed no acute findings Previous records obtained and reviewed in epic, no prior mention of any arrhythmias I consulted Dr. Harriet Masson cardiology and discussed lab and imaging findings  Critical Interventions:  None  After the interventions stated above, I reevaluated the patient and found patient to be currently asymptomatic with stable vitals.  She elects not to start on anticoagulation and follow-up with cardiology as referred.  Return instructions discussed This patients CHA2DS2-VASc Score and unadjusted Ischemic Stroke Rate (% per year) is equal to 4.8 % stroke rate/year from a score of 4  Above score calculated as 1 point each if present [CHF, HTN, DM, Vascular=MI/PAD/Aortic Plaque, Age if 65-74, or Female] Above score calculated as 2 points each if present [Age > 75, or Stroke/TIA/TE]    Final Clinical Impression(s) / ED Diagnoses Final diagnoses:  Dizziness  Atypical chest pain  Atrial fibrillation, unspecified type [facility])    Rx / DC Orders ED Discharge Orders     None        Hayden Rasmussen, MD 09/12/21 1950

## 2021-09-13 ENCOUNTER — Encounter (INDEPENDENT_AMBULATORY_CARE_PROVIDER_SITE_OTHER): Payer: Self-pay | Admitting: Bariatrics

## 2021-09-14 DIAGNOSIS — I4891 Unspecified atrial fibrillation: Secondary | ICD-10-CM | POA: Diagnosis not present

## 2021-09-14 DIAGNOSIS — Z6825 Body mass index (BMI) 25.0-25.9, adult: Secondary | ICD-10-CM | POA: Diagnosis not present

## 2021-09-27 DIAGNOSIS — R059 Cough, unspecified: Secondary | ICD-10-CM | POA: Diagnosis not present

## 2021-10-04 ENCOUNTER — Emergency Department (HOSPITAL_COMMUNITY): Payer: Medicare PPO

## 2021-10-04 ENCOUNTER — Encounter (HOSPITAL_COMMUNITY): Payer: Self-pay | Admitting: Internal Medicine

## 2021-10-04 ENCOUNTER — Other Ambulatory Visit: Payer: Self-pay

## 2021-10-04 ENCOUNTER — Observation Stay (HOSPITAL_COMMUNITY): Payer: Medicare PPO

## 2021-10-04 ENCOUNTER — Observation Stay (HOSPITAL_COMMUNITY)
Admission: EM | Admit: 2021-10-04 | Discharge: 2021-10-05 | Disposition: A | Discharge status: Home / Self Care | Payer: Medicare PPO | Attending: Internal Medicine | Admitting: Internal Medicine

## 2021-10-04 DIAGNOSIS — I82402 Acute embolism and thrombosis of unspecified deep veins of left lower extremity: Secondary | ICD-10-CM | POA: Insufficient documentation

## 2021-10-04 DIAGNOSIS — Z9889 Other specified postprocedural states: Secondary | ICD-10-CM | POA: Diagnosis not present

## 2021-10-04 DIAGNOSIS — R231 Pallor: Secondary | ICD-10-CM | POA: Diagnosis not present

## 2021-10-04 DIAGNOSIS — I480 Paroxysmal atrial fibrillation: Secondary | ICD-10-CM | POA: Insufficient documentation

## 2021-10-04 DIAGNOSIS — Z7901 Long term (current) use of anticoagulants: Secondary | ICD-10-CM | POA: Insufficient documentation

## 2021-10-04 DIAGNOSIS — R0902 Hypoxemia: Secondary | ICD-10-CM | POA: Diagnosis not present

## 2021-10-04 DIAGNOSIS — R110 Nausea: Secondary | ICD-10-CM | POA: Diagnosis not present

## 2021-10-04 DIAGNOSIS — Z79899 Other long term (current) drug therapy: Secondary | ICD-10-CM | POA: Diagnosis not present

## 2021-10-04 DIAGNOSIS — U071 COVID-19: Secondary | ICD-10-CM | POA: Diagnosis present

## 2021-10-04 DIAGNOSIS — Z872 Personal history of diseases of the skin and subcutaneous tissue: Secondary | ICD-10-CM | POA: Diagnosis not present

## 2021-10-04 DIAGNOSIS — R42 Dizziness and giddiness: Principal | ICD-10-CM

## 2021-10-04 DIAGNOSIS — Z981 Arthrodesis status: Secondary | ICD-10-CM | POA: Diagnosis not present

## 2021-10-04 DIAGNOSIS — Z8709 Personal history of other diseases of the respiratory system: Secondary | ICD-10-CM | POA: Diagnosis not present

## 2021-10-04 DIAGNOSIS — I672 Cerebral atherosclerosis: Secondary | ICD-10-CM | POA: Diagnosis not present

## 2021-10-04 DIAGNOSIS — E042 Nontoxic multinodular goiter: Secondary | ICD-10-CM | POA: Diagnosis not present

## 2021-10-04 LAB — BASIC METABOLIC PANEL
Anion gap: 7 (ref 5–15)
BUN: 27 mg/dL — ABNORMAL HIGH (ref 8–23)
CO2: 25 mmol/L (ref 22–32)
Calcium: 9.4 mg/dL (ref 8.9–10.3)
Chloride: 107 mmol/L (ref 98–111)
Creatinine, Ser: 0.81 mg/dL (ref 0.44–1.00)
GFR, Estimated: >60 mL/min (ref >60)
Glucose, Bld: 145 mg/dL — ABNORMAL HIGH (ref 70–99)
Potassium: 3.9 mmol/L (ref 3.5–5.1)
Sodium: 139 mmol/L (ref 135–145)

## 2021-10-04 LAB — CBC WITH DIFFERENTIAL/PLATELET
Abs Immature Granulocytes: 0.03 10*3/uL (ref 0.00–0.07)
Basophils Absolute: 0 10*3/uL (ref 0.0–0.1)
Basophils Relative: 0 %
Eosinophils Absolute: 0.1 10*3/uL (ref 0.0–0.5)
Eosinophils Relative: 1 %
HCT: 38.2 % (ref 36.0–46.0)
Hemoglobin: 12.7 g/dL (ref 12.0–15.0)
Immature Granulocytes: 0 %
Lymphocytes Relative: 12 %
Lymphs Abs: 0.9 10*3/uL (ref 0.7–4.0)
MCH: 30.7 pg (ref 26.0–34.0)
MCHC: 33.2 g/dL (ref 30.0–36.0)
MCV: 92.3 fL (ref 80.0–100.0)
Monocytes Absolute: 0.4 10*3/uL (ref 0.1–1.0)
Monocytes Relative: 6 %
Neutro Abs: 5.9 10*3/uL (ref 1.7–7.7)
Neutrophils Relative %: 81 %
Platelets: 237 10*3/uL (ref 150–400)
RBC: 4.14 MIL/uL (ref 3.87–5.11)
RDW: 13.8 % (ref 11.5–15.5)
WBC: 7.4 10*3/uL (ref 4.0–10.5)
nRBC: 0 % (ref 0.0–0.2)

## 2021-10-04 LAB — TROPONIN I (HIGH SENSITIVITY)
Troponin I (High Sensitivity): <2 ng/L (ref <18)
Troponin I (High Sensitivity): <2 ng/L (ref <18)

## 2021-10-04 LAB — RESP PANEL BY RT-PCR (FLU A&B, COVID) ARPGX2
Influenza A by PCR: NEGATIVE
Influenza B by PCR: NEGATIVE
SARS Coronavirus 2 by RT PCR: POSITIVE — AB

## 2021-10-04 LAB — HEMOGLOBIN A1C
Hgb A1c MFr Bld: 5.3 % (ref 4.8–5.6)
Mean Plasma Glucose: 105.41 mg/dL

## 2021-10-04 MED ORDER — PROCHLORPERAZINE EDISYLATE 10 MG/2ML IJ SOLN: 10.0000 mg | Freq: Four times a day (QID) | INTRAMUSCULAR | Status: DC | PRN

## 2021-10-04 MED ORDER — RIVAROXABAN 20 MG PO TABS
20.0000 mg | ORAL_TABLET | Freq: Every day | ORAL | Status: AC
  Administered 2021-10-04: 20 mg via ORAL
  Filled 2021-10-04: qty 1

## 2021-10-04 MED ORDER — MECLIZINE HCL 25 MG PO TABS
25.0000 mg | ORAL_TABLET | Freq: Once | ORAL | Status: AC
  Administered 2021-10-04: 25 mg via ORAL
  Filled 2021-10-04: qty 1

## 2021-10-04 MED ORDER — IOHEXOL 350 MG/ML SOLN
60.0000 mL | Freq: Once | INTRAVENOUS | Status: AC | PRN
  Administered 2021-10-04: 60 mL via INTRAVENOUS

## 2021-10-04 MED ORDER — ACETAMINOPHEN 325 MG PO TABS: 650.0000 mg | ORAL_TABLET | ORAL | Status: DC | PRN

## 2021-10-04 MED ORDER — MECLIZINE HCL 25 MG PO TABS
25.0000 mg | ORAL_TABLET | Freq: Three times a day (TID) | ORAL | Status: DC
  Administered 2021-10-04 – 2021-10-05 (×3): 25 mg via ORAL
  Filled 2021-10-04 (×3): qty 1

## 2021-10-04 MED ORDER — ONDANSETRON HCL 4 MG/2ML IJ SOLN
4.0000 mg | Freq: Four times a day (QID) | INTRAMUSCULAR | Status: DC | PRN
  Administered 2021-10-04: 4 mg via INTRAVENOUS
  Filled 2021-10-04: qty 2

## 2021-10-04 NOTE — Plan of Care (Signed)
°  Problem: Education: Goal: Knowledge of General Education information will improve Description: Including pain rating scale, medication(s)/side effects and non-pharmacologic comfort measures Outcome: Progressing   Problem: Clinical Measurements: Goal: Will remain free from infection Outcome: Progressing Goal: Diagnostic test results will improve Outcome: Progressing Goal: Respiratory complications will improve Outcome: Progressing   Problem: Activity: Goal: Risk for activity intolerance will decrease Outcome: Progressing   Problem: Elimination: Goal: Will not experience complications related to bowel motility Outcome: Progressing Goal: Will not experience complications related to urinary retention Outcome: Progressing   Problem: Safety: Goal: Ability to remain free from injury will improve Outcome: Progressing

## 2021-10-04 NOTE — ED Provider Notes (Signed)
[facility] EMERGENCY DEPARTMENT Provider Note    CSN: [id number] Arrival date & time: 10/04/21 0900  History Chief Complaint  Patient presents with   Dizziness    Maureen Elliott is a 76 y.o. female with history of recurrent vertigo reports she previously had to have sinus surgery about a year and a half ago and after that her vertigo went away but it has since returned. This morning she woke up with room spinning dizziness and feeling off balance. She reports 'intermittent' chest pains. She has also recently had L sided facial pain so she was given Rx for doxycycline which she finished last night but has not had improvement. At her most recent ED visits for dizziness about 3-4 weeks ago she was also diagnosed with afib, PCP started her on Xarelto. She had nausea and diaphoresis with her dizziness this morning, similar to previous.   Home Medications Prior to Admission medications   Medication Sig Start Date End Date Taking? Authorizing Provider  brimonidine (ALPHAGAN) 0.2 % ophthalmic solution Place 1 drop into both eyes 3 (three) times daily.    [provider]  CALCIUM PO Take 1 Dose by mouth daily.    [provider]  Carboxymethylcell-Hypromellose (GENTEAL OP) Apply 1 drop to eye as needed (dry eyes).    [provider]  Cholecalciferol (VITAMIN D) 50 MCG (2000 UT) CAPS Take 1 capsule (2,000 Units total) by mouth daily. 03/31/21   Whitmire, Thermon Leyland, FNP  conjugated estrogens (PREMARIN) vaginal cream Place 1 Applicatorful vaginally 2 (two) times a week.    [provider]  Glucosamine HCl (GLUCOSAMINE PO) Take 1 Dose by mouth daily.    [provider]  melatonin 5 MG TABS Take 5 mg by mouth at bedtime.    [provider]  Omega-3 Fatty Acids (FISH OIL) 1200 MG CAPS Take 1,200 mg by mouth daily.    [provider]  pilocarpine (PILOCAR) 1 % ophthalmic solution Place 1 drop into both eyes 3 (three) times daily. 04/10/20    [provider]  timolol (TIMOPTIC) 0.5 % ophthalmic solution Place 1 drop into both eyes 2 (two) times daily. 01/24/21   [provider]  Tretinoin 0.075 % CREA Apply 1 application topically at bedtime.    [provider]     Allergies    Amoxicillin, Caffeine, Dorzolamide, Keflex [cephalexin], Levaquin [levofloxacin], Maxitrol [neomycin-polymyxin-dexameth], Methotrexate derivatives, Prednisone, Restasis [cyclosporine], and Valtrex [valacyclovir]   Review of Systems   Review of Systems Please see HPI for pertinent positives and negatives  Physical Exam BP (!) 127/51    Pulse 63    Temp (!) 97.2 F (36.2 C)    Resp (!) 22    SpO2 100%   Physical Exam Vitals and nursing note reviewed.  Constitutional:      Appearance: Normal appearance.  HENT:     Head: Normocephalic and atraumatic.     Nose: Nose normal.     Mouth/Throat:     Mouth: Mucous membranes are moist.  Eyes:     Extraocular Movements: Extraocular movements intact.     Conjunctiva/sclera: Conjunctivae normal.  Cardiovascular:     Rate and Rhythm: Normal rate.  Pulmonary:     Effort: Pulmonary effort is normal.     Breath sounds: Normal breath sounds.  Abdominal:     General: Abdomen is flat.     Palpations: Abdomen is soft.     Tenderness: There is no abdominal tenderness.  Musculoskeletal:  General: No swelling. Normal range of motion.     Cervical back: Neck supple.  Skin:    General: Skin is warm and dry.  Neurological:     General: No focal deficit present.     Mental Status: She is alert and oriented to person, place, and time.     Cranial Nerves: No cranial nerve deficit.     Sensory: No sensory deficit.     Motor: No weakness.     Comments: No nystagmus, but feel dizzy with sitting up  Psychiatric:        Mood and Affect: Mood normal.    ED Results / Procedures / Treatments   EKG EKG Interpretation  Date/Time:  Tuesday October 04 2021 09:48:37 EST Ventricular  Rate:  57 PR Interval:  173 QRS Duration: 97 QT Interval:  436 QTC Calculation: 425 R Axis:   19 Text Interpretation: Sinus rhythm Abnormal R-wave progression, early transition No significant change since last tracing Confirmed by Susy Frizzle [phone]) on 10/04/2021 10:11:22 AM  Procedures Procedures  Medications Ordered in the ED Medications  meclizine (ANTIVERT) tablet 25 mg (25 mg Oral Given 10/04/21 0948)    Initial Impression and Plan  Patient here with recurrence of vertiginous dizziness and facial pain. She states that everytime she goes to the ER they scan her head but nobody has scanned her face to evaluate her sinuses. Will check imaging and labs today. Try meclizine for symptom relief.   ED Course   Clinical Course as of 10/04/21 1228  Tue Oct 04, 2021  1011 CBC is normal.  [CS]  1037 BMP and Trop neg x 1.  [CS]  1143 CT images and results reviewed, neg for acute process. Patient reports she is still feeling dizzy, unable to stand or walk, very nauseated. Will plan admission for  [CS]  1227 Spoke with Dr. Ronaldo Miyamoto, Hospitalist, who will evaluate the patient for admission.  [CS]    Clinical Course User Index [CS] Pollyann Savoy, MD     MDM Rules/Calculators/A&P Medical Decision Making Problems Addressed: Vertigo: acute illness or injury with systemic symptoms  Amount and/or Complexity of Data Reviewed External Data Reviewed: radiology. Labs: ordered. Decision-making details documented in ED Course. Radiology: ordered and independent interpretation performed. Decision-making details documented in ED Course. ECG/medicine tests: ordered and independent interpretation performed.  Risk Prescription drug management. Decision regarding hospitalization.    Final Clinical Impression(s) / ED Diagnoses Final diagnoses:  Vertigo    Rx / DC Orders ED Discharge Orders     None        Pollyann Savoy, MD 10/04/21 1228

## 2021-10-04 NOTE — H&P (Signed)
History and Physical    [name] [id number] DOB: Jul 17, 1946 DOA: 10/04/2021  PCP: Kathyrn Lass, MD  Patient coming from: Home  Chief Complaint: dizziness  HPI: Maureen Elliott is a 76 y.o. female with medical history significant of glaucoma, HLD, a fib. Presenting with dizziness. She's had several weeks of dizziness. She came the ED 12/19 for dizziness, but her ED w/u was negative. At the time she was noted to have a fib. It was recommended that she follow up with cardiology. Her PCP started her on xarelto and she is still waiting to have her cards visit. She started having facial pain about a week or so ago. She thought that may be playing into her dizziness. She was started on doxycycline. As she was finishing up her doxy, it seemed to improve her dizziness. However over the last day her dizziness has worsened. She also has non-specific intermittent chest pains during some of these episodes. She returns today d/t dizziness and associated N/V. She denies any other aggravating or alleviating factors.   ED Course: CTH and maxillofacial are negative. She was noted to be dizzy and vomit during ambulation. TRH was called for admission.   Review of Systems: Review of systems is otherwise negative for all not mentioned in HPI.   PMHx Past Medical History:  Diagnosis Date   Complication of anesthesia    be careful with intubation due to previous cervical neck surgery   Cystocele    Diverticulitis    Diverticulosis    DVT (deep venous thrombosis) (HCC)    left leg   Gallbladder problem    Gestational diabetes    Glaucoma    Hemorrhoids internal and external   High cholesterol    History of gestational diabetes yrs ago   Joint pain    Lactose intolerance    Nocturia    Osteoarthritis    Partial hearing loss    PONV (postoperative nausea and vomiting) 2013   n/v after phenergan, injectable phenergan helps with nausea   Prediabetes     PSHx Past Surgical History:   Procedure Laterality Date   ABDOMINAL HYSTERECTOMY  2013   ovaries removed    BLADDER SURGERY     BREAST BIOPSY Bilateral 2001   cervical fusion C 5, C 6 and C 7  2004   colonscopy  feb 2015   blood in stool, just was hemorrhoids   CYSTOCELE REPAIR N/A 01/13/2014   Procedure: CYSTOSCOPY, REPAIR CYSTOCELE VAULT PROLAPSE/GRAFT;  Surgeon: Reece Packer, MD;  Location: [facility];  Service: Urology;  Laterality: N/A;   EYE SURGERY Bilateral 8 yrs ago   lens replacement for cataract   GLAUCOMA SURGERY     SEPTOPLASTY Bilateral 03/01/2020   Procedure: NASAL SEPTOPLASTY;  Surgeon: Leta Baptist, MD;  Location: [facility];  Service: ENT;  Laterality: Bilateral;   tummy tuck with mesh  6 yrs ago   large abdominal is present    SocHx  reports that she has never smoked. She has never used smokeless tobacco. She reports current alcohol use. She reports that she does not use drugs.  Allergies  Allergen Reactions   Amoxicillin Diarrhea    Normal adverse effect   Caffeine Other (See Comments)    Avoid due to glaucoma   Dorzolamide Other (See Comments)    Severe eye irritation   Keflex [Cephalexin] Diarrhea    Normal adverse effect   Levaquin [Levofloxacin] Other (See Comments)    Joint pain and stiffness  Maxitrol [Neomycin-Polymyxin-Dexameth] Other (See Comments)    Burns eyes   Methotrexate Derivatives Other (See Comments)    Hearing damage   Prednisone Other (See Comments)    Avoid due to glaucoma   Restasis [Cyclosporine] Other (See Comments)    Burns eyes   Valtrex [Valacyclovir] Swelling and Other (See Comments)    Face swelling, headache on left side, increased eye pressure    FamHx Family History  Problem Relation Age of Onset   Breast cancer Maternal Aunt    Breast cancer Maternal Aunt    Breast cancer Maternal Aunt    Cancer Mother    Hypertension Father    Cancer Father    Alcohol abuse Father    Obesity Father     Prior to Admission medications    Medication Sig Start Date End Date Taking? Authorizing Provider  brimonidine (ALPHAGAN) 0.2 % ophthalmic solution Place 1 drop into both eyes 3 (three) times daily.    [provider]  CALCIUM PO Take 1 Dose by mouth daily.    [provider]  Carboxymethylcell-Hypromellose (GENTEAL OP) Apply 1 drop to eye as needed (dry eyes).    [provider]  Cholecalciferol (VITAMIN D) 50 MCG (2000 UT) CAPS Take 1 capsule (2,000 Units total) by mouth daily. 03/31/21   Whitmire, Joneen Boers, FNP  conjugated estrogens (PREMARIN) vaginal cream Place 1 Applicatorful vaginally 2 (two) times a week.    [provider]  Glucosamine HCl (GLUCOSAMINE PO) Take 1 Dose by mouth daily.    [provider]  melatonin 5 MG TABS Take 5 mg by mouth at bedtime.    [provider]  Omega-3 Fatty Acids (FISH OIL) 1200 MG CAPS Take 1,200 mg by mouth daily.    [provider]  pilocarpine (PILOCAR) 1 % ophthalmic solution Place 1 drop into both eyes 3 (three) times daily. 04/10/20   [provider]  timolol (TIMOPTIC) 0.5 % ophthalmic solution Place 1 drop into both eyes 2 (two) times daily. 01/24/21   [provider]  Tretinoin 0.075 % CREA Apply 1 application topically at bedtime.    [provider]    Physical Exam: Vitals:   10/04/21 0920 10/04/21 0922 10/04/21 1000 10/04/21 1110  BP:  (!) 135/56 (!) 126/58 (!) 127/51  Pulse:  (!) 57 (!) 57 63  Resp:  15 19 (!) 22  Temp:  (!) 97.2 F (36.2 C)    SpO2: 100% 100% 100% 100%    General: 76 y.o. female resting in bed in NAD ENMT: Nares patent w/o discharge, orophaynx clear, dentition normal, ears w/o discharge/lesions/ulcers Neck: Supple, trachea midline Cardiovascular: RRR, +S1, S2, no m/g/r, equal pulses throughout Respiratory: CTABL, no w/r/r, normal WOB GI: BS+, NDNT, no masses noted, no organomegaly noted MSK: No e/c/c Neuro: A&O x 3, no focal deficits Psyc: Appropriate interaction  and affect, calm/cooperative  Labs on Admission: I have personally reviewed following labs and imaging studies  CBC: Recent Labs  Lab 10/04/21 0939  WBC 7.4  NEUTROABS 5.9  HGB 12.7  HCT 38.2  MCV 92.3  PLT [id number]   Basic Metabolic Panel: Recent Labs  Lab 10/04/21 0939  NA 139  K 3.9  CL 107  CO2 25  GLUCOSE 145*  BUN 27*  CREATININE 0.81  CALCIUM 9.4   GFR: CrCl cannot be calculated (Unknown ideal weight.). Liver Function Tests: No results for input(s): AST, ALT, ALKPHOS, BILITOT, PROT, ALBUMIN in the last 168 hours. No results for input(s): LIPASE,  AMYLASE in the last 168 hours. No results for input(s): AMMONIA in the last 168 hours. Coagulation Profile: No results for input(s): INR, PROTIME in the last 168 hours. Cardiac Enzymes: No results for input(s): CKTOTAL, CKMB, CKMBINDEX, TROPONINI in the last 168 hours. BNP (last 3 results) No results for input(s): PROBNP in the last 8760 hours. HbA1C: No results for input(s): HGBA1C in the last 72 hours. CBG: No results for input(s): GLUCAP in the last 168 hours. Lipid Profile: No results for input(s): CHOL, HDL, LDLCALC, TRIG, CHOLHDL, LDLDIRECT in the last 72 hours. Thyroid Function Tests: No results for input(s): TSH, T4TOTAL, FREET4, T3FREE, THYROIDAB in the last 72 hours. Anemia Panel: No results for input(s): VITAMINB12, FOLATE, FERRITIN, TIBC, IRON, RETICCTPCT in the last 72 hours. Urine analysis:    Component Value Date/Time   COLORURINE YELLOW 03/06/2021 1008   APPEARANCEUR HAZY (A) 03/06/2021 1008   LABSPEC 1.013 03/06/2021 1008   PHURINE 6.0 03/06/2021 1008   GLUCOSEU NEGATIVE 03/06/2021 1008   HGBUR NEGATIVE 03/06/2021 1008   BILIRUBINUR NEGATIVE 03/06/2021 1008   KETONESUR NEGATIVE 03/06/2021 1008   PROTEINUR NEGATIVE 03/06/2021 1008   UROBILINOGEN 0.2 09/15/2014 1130   NITRITE NEGATIVE 03/06/2021 1008   LEUKOCYTESUR NEGATIVE 03/06/2021 1008    Radiological Exams on Admission: CT Head Wo  Contrast  Result Date: 10/04/2021 CLINICAL DATA:  Dizziness, persistent/recurrent, cardiac or vascular cause suspected; Maxillary/facial abscess recurrent sinusitis EXAM: CT HEAD WITHOUT CONTRAST CT MAXILLOFACIAL WITHOUT CONTRAST TECHNIQUE: Multidetector CT imaging of the head and maxillofacial structures were performed using the standard protocol without intravenous contrast. Multiplanar CT image reconstructions of the maxillofacial structures were also generated. COMPARISON:  None. FINDINGS: CT HEAD FINDINGS Brain: No evidence of acute intracranial hemorrhage or extra-axial collection.No evidence of mass lesion/concern mass effect.The ventricles are unchanged in size.Scattered subcortical and periventricular white matter hypodensities, nonspecific but likely sequela of chronic small vessel ischemic disease. Vascular: No hyperdense vessel or unexpected calcification. Skull: Unchanged lytic lesion in the left frontal calvarium with benign features. No acute fracture. Other: None. CT MAXILLOFACIAL FINDINGS Osseous: No fracture or mandibular dislocation. No destructive process. Partially visualized ACDF hardware. Orbits: Prior orbital surgeries. No acute traumatic or inflammatory finding. Sinuses: The paranasal sinuses are predominantly clear. Chronic small right mastoid effusion. Soft tissues: Unremarkable. IMPRESSION: No acute intracranial abnormality. Mild sequela of chronic small vessel ischemic disease. Predominantly clear paranasal sinuses. Chronic small right mastoid effusion. Electronically Signed   By: Maurine Simmering M.D.   On: 10/04/2021 10:55   CT Maxillofacial Wo Contrast  Result Date: 10/04/2021 CLINICAL DATA:  Dizziness, persistent/recurrent, cardiac or vascular cause suspected; Maxillary/facial abscess recurrent sinusitis EXAM: CT HEAD WITHOUT CONTRAST CT MAXILLOFACIAL WITHOUT CONTRAST TECHNIQUE: Multidetector CT imaging of the head and maxillofacial structures were performed using the standard  protocol without intravenous contrast. Multiplanar CT image reconstructions of the maxillofacial structures were also generated. COMPARISON:  None. FINDINGS: CT HEAD FINDINGS Brain: No evidence of acute intracranial hemorrhage or extra-axial collection.No evidence of mass lesion/concern mass effect.The ventricles are unchanged in size.Scattered subcortical and periventricular white matter hypodensities, nonspecific but likely sequela of chronic small vessel ischemic disease. Vascular: No hyperdense vessel or unexpected calcification. Skull: Unchanged lytic lesion in the left frontal calvarium with benign features. No acute fracture. Other: None. CT MAXILLOFACIAL FINDINGS Osseous: No fracture or mandibular dislocation. No destructive process. Partially visualized ACDF hardware. Orbits: Prior orbital surgeries. No acute traumatic or inflammatory finding. Sinuses: The paranasal sinuses are predominantly clear. Chronic small right mastoid effusion. Soft tissues: Unremarkable. IMPRESSION:  No acute intracranial abnormality. Mild sequela of chronic small vessel ischemic disease. Predominantly clear paranasal sinuses. Chronic small right mastoid effusion. Electronically Signed   By: Maurine Simmering M.D.   On: 10/04/2021 10:55    EKG: Independently reviewed. Sinus, no st elevations  Assessment/Plan Vertigo     - place in obs, tele     - check orthostatics     - PT vestibular eval     - meclizine  Chest pain     - trp negative x 2     - EKG is ok     - check echo, monitor on tele  Paroxsymal a fib     - currently on xarelto     - continue outpt follow up  Glaucoma     - continue home regimen  HLD      - continue home regimen  Prediabetes      - hyperglycemic today; check A1c  DVT prophylaxis: xarelto  Code Status: FULL  Family Communication: w/ husband at bedside  Consults called: None   Status is: Observation  The patient remains OBS appropriate and will d/c before 2 midnights.  Jonnie Finner DO [facility]  If 7PM-7AM, please contact night-coverage [email or web address]  10/04/2021, 12:29 PM

## 2021-10-04 NOTE — ED Triage Notes (Signed)
Patient BIBA from home. C/O dizziness, sweating, chest px. Finished treatment for sinus infection last night. Passed stroke screen from EMS. AOX4  Hx Afib- Xarelto  Given 8mg  IV Zofran  BP: 130/66 HR: 66 SpO2: 98 RA CBG: 140

## 2021-10-04 NOTE — ED Notes (Signed)
Attempted to ambulate patient. Patient sat on edge of bed, then stood and immediately began to stumble. Patient sat back in bed, c/o extreme nausea and dizziness.

## 2021-10-05 ENCOUNTER — Observation Stay (HOSPITAL_BASED_OUTPATIENT_CLINIC_OR_DEPARTMENT_OTHER): Payer: Medicare PPO

## 2021-10-05 DIAGNOSIS — U071 COVID-19: Secondary | ICD-10-CM | POA: Diagnosis present

## 2021-10-05 DIAGNOSIS — R079 Chest pain, unspecified: Secondary | ICD-10-CM | POA: Diagnosis not present

## 2021-10-05 DIAGNOSIS — R42 Dizziness and giddiness: Secondary | ICD-10-CM | POA: Diagnosis not present

## 2021-10-05 LAB — CBC WITH DIFFERENTIAL/PLATELET
Abs Immature Granulocytes: 0.02 10*3/uL (ref 0.00–0.07)
Basophils Absolute: 0 10*3/uL (ref 0.0–0.1)
Basophils Relative: 1 %
Eosinophils Absolute: 0.1 10*3/uL (ref 0.0–0.5)
Eosinophils Relative: 1 %
HCT: 38.6 % (ref 36.0–46.0)
Hemoglobin: 12.7 g/dL (ref 12.0–15.0)
Immature Granulocytes: 0 %
Lymphocytes Relative: 22 %
Lymphs Abs: 1.8 10*3/uL (ref 0.7–4.0)
MCH: 30.8 pg (ref 26.0–34.0)
MCHC: 32.9 g/dL (ref 30.0–36.0)
MCV: 93.5 fL (ref 80.0–100.0)
Monocytes Absolute: 0.9 10*3/uL (ref 0.1–1.0)
Monocytes Relative: 11 %
Neutro Abs: 5.2 10*3/uL (ref 1.7–7.7)
Neutrophils Relative %: 65 %
Platelets: 228 10*3/uL (ref 150–400)
RBC: 4.13 MIL/uL (ref 3.87–5.11)
RDW: 13.8 % (ref 11.5–15.5)
WBC: 8 10*3/uL (ref 4.0–10.5)
nRBC: 0 % (ref 0.0–0.2)

## 2021-10-05 LAB — ECHOCARDIOGRAM COMPLETE
AR max vel: 2.21 cm2
AV Area VTI: 2.68 cm2
AV Area mean vel: 2.28 cm2
AV Mean grad: 4 mmHg
AV Peak grad: 10.1 mmHg
Ao pk vel: 1.59 m/s
Area-P 1/2: 2.37 cm2
Height: 63 in
S' Lateral: 2.6 cm
Weight: 2345.69 oz

## 2021-10-05 LAB — COMPREHENSIVE METABOLIC PANEL
ALT: 16 U/L (ref 0–44)
AST: 19 U/L (ref 15–41)
Albumin: 3.4 g/dL — ABNORMAL LOW (ref 3.5–5.0)
Alkaline Phosphatase: 54 U/L (ref 38–126)
Anion gap: 10 (ref 5–15)
BUN: 17 mg/dL (ref 8–23)
CO2: 25 mmol/L (ref 22–32)
Calcium: 9.6 mg/dL (ref 8.9–10.3)
Chloride: 104 mmol/L (ref 98–111)
Creatinine, Ser: 0.72 mg/dL (ref 0.44–1.00)
GFR, Estimated: >60 mL/min (ref >60)
Glucose, Bld: 97 mg/dL (ref 70–99)
Potassium: 3.5 mmol/L (ref 3.5–5.1)
Sodium: 139 mmol/L (ref 135–145)
Total Bilirubin: 0.7 mg/dL (ref 0.3–1.2)
Total Protein: 6.7 g/dL (ref 6.5–8.1)

## 2021-10-05 MED ORDER — PILOCARPINE HCL 1 % OP SOLN
1.0000 [drp] | Freq: Three times a day (TID) | OPHTHALMIC | Status: DC
  Filled 2021-10-05: qty 15

## 2021-10-05 MED ORDER — RIVAROXABAN 20 MG PO TABS: 20.0000 mg | ORAL_TABLET | Freq: Every day | ORAL | Status: DC

## 2021-10-05 MED ORDER — MECLIZINE HCL 25 MG PO TABS: 25.0000 mg | ORAL_TABLET | Freq: Three times a day (TID) | ORAL | 0 refills | Status: DC | PRN

## 2021-10-05 MED ORDER — MELATONIN 5 MG PO TABS: 5.0000 mg | ORAL_TABLET | Freq: Every day | ORAL | Status: DC

## 2021-10-05 MED ORDER — TIMOLOL MALEATE 0.5 % OP SOLN
1.0000 [drp] | Freq: Two times a day (BID) | OPHTHALMIC | Status: DC
  Filled 2021-10-05: qty 5

## 2021-10-05 MED ORDER — ALBUTEROL SULFATE HFA 108 (90 BASE) MCG/ACT IN AERS: 2.0000 | INHALATION_SPRAY | RESPIRATORY_TRACT | Status: DC | PRN

## 2021-10-05 MED ORDER — BRIMONIDINE TARTRATE 0.2 % OP SOLN
1.0000 [drp] | Freq: Three times a day (TID) | OPHTHALMIC | Status: DC
  Filled 2021-10-05: qty 5

## 2021-10-05 NOTE — Assessment & Plan Note (Signed)
39 CT

## 2021-10-05 NOTE — Progress Notes (Signed)
Echocardiogram 2D Echocardiogram has been performed.  Maureen Elliott 10/05/2021, 1:36 PM

## 2021-10-05 NOTE — Evaluation (Signed)
Physical Therapy Evaluation Patient Details Name: Maureen Elliott MRN: [id number] DOB: 12-Feb-1946 Today's Date: 10/05/2021  History of Present Illness  Patient is 76 y.o. female presenting with dizziness. She's had several weeks of dizziness. She came the ED 12/19 for dizziness, but her ED w/u was negative. At the time she was noted to have a fib. It was recommended that she follow up with cardiology. Her PCP started her on xarelto and she is still waiting to have her cards visit. She started having facial pain about a week or so ago. She thought that may be playing into her dizziness. She was started on doxycycline. As she was finishing up her doxy, it seemed to improve her dizziness. However over the last day her dizziness has worsened. She also has non-specific intermittent chest pains during some of these episodes. She returns today d/t dizziness and associated N/V. PMH significant for DVT, glaucoma, OA, hearing loss, C5-7 fusion, septoplasty.    Clinical Impression  Maureen Elliott is 76 y.o. female admitted with above HPI and diagnosis. Patient is currently limited by functional impairments below (see PT problem list). Patient lives with her spouse and is independent at baseline. Vestibular evaluation completed and HINTS testing negative for central concern. VOR appears intact and gaze testing limited by impaired visual field secondary to glaucoma. Pt did not experience increased symptoms or nystagmus with bil Dix-Hallpike testing and position changes/head rotation does not appear to impact dizziness. Patient was able to complete transfers and ambulate with min guard for safety and overall demonstrates slow cautious gait with slightly shuffled steps.Patient will benefit from continued skilled PT interventions to address impairments and progress independence with mobility, recommending OPPT follow up for further vestibular testing and ENT follow up. Acute PT will follow and progress as able.         Recommendations for follow up therapy are one component of a multi-disciplinary discharge planning process, led by the attending physician.  Recommendations may be updated based on patient status, additional functional criteria and insurance authorization.  PT Recommendation   Follow Up Recommendations Outpatient PT  [Vestibular OPPT, ENT follow up] Filed 10/05/2021 0900  Assistance recommended at discharge Intermittent Supervision/Assistance Filed 10/05/2021 0900  Functional Status Assessment Patient has had a recent decline in their functional status and demonstrates the ability to make significant improvements in function in a reasonable and predictable amount of time. Filed 10/05/2021 0900  PT equipment None recommended by PT Filed 10/05/2021 0900     Vestibular Assessment - 10/05/21 0001       Vestibular Assessment   General Observation no distress, no nystagmus at rest. Lt eyelid drooping.      Symptom Behavior   Subjective history of current problem Vertigo began about 4 days PTA, something was spinning, nothing helped but would sit down to keep from falling. 12/19th in ed from dizziness and afib, still hasbn't been to cardiologist - PCP prescribed blood thinner. had facial pain and has had sinus sx and problems thought it was a sinus infection on Jan 1st it was extremely bad with head movement and positioning. started to get better and then came back after antibiotics. Lt face pain    Type of Dizziness  Imbalance;Unsteady with head/body turns;Spinning;Vertigo   nausea   Frequency of Dizziness has been intermittent/fluctuating in severity    Symptom Nature Spontaneous;Variable    Aggravating Factors No known aggravating factors    Relieving Factors No known relieving factors    Progression of Symptoms Better  History of similar episodes Pt presented to ED on 12/19 with dizziness and no acute findings with testing. Pt noted to have afib and referred to cardiology.      Oculomotor  Exam   Oculomotor Alignment Normal    Ocular ROM WNLs    Spontaneous Absent    Gaze-induced  Absent    Head shaking Horizontal Absent    Head Shaking Vertical Absent    Smooth Pursuits Saccades    Saccades Dysmetria;Slow;Poor trajectory    Comment SKEW testing normal. tracking slightly abnormal more to Lt side than Rt, however pt with glaucoma and visual field impaired with poor peripheral vision.      Positional Testing   Dix-Hallpike Dix-Hallpike Right;Dix-Hallpike Left      Dix-Hallpike Right   Dix-Hallpike Right Symptoms No nystagmus      Dix-Hallpike Left   Dix-Hallpike Left Symptoms No nystagmus      Cognition   Cognition Orientation Level Oriented x 4               10/05/21 0900  PT Visit Information  Last PT Received On 10/05/21  Assistance Needed +1  History of Present Illness Patient is 76 y.o. female presenting with dizziness. She's had several weeks of dizziness. She came the ED 12/19 for dizziness, but her ED w/u was negative. At the time she was noted to have a fib. It was recommended that she follow up with cardiology. Her PCP started her on xarelto and she is still waiting to have her cards visit. She started having facial pain about a week or so ago. She thought that may be playing into her dizziness. She was started on doxycycline. As she was finishing up her doxy, it seemed to improve her dizziness. However over the last day her dizziness has worsened. She also has non-specific intermittent chest pains during some of these episodes. She returns today d/t dizziness and associated N/V. PMH significant for DVT, glaucoma, OA, hearing loss, C5-7 fusion, septoplasty.  Precautions  Precautions Fall  Restrictions  Weight Bearing Restrictions No  Home Living  Family/patient expects to be discharged to: Private residence  Living Arrangements Spouse/significant other  Available Help at Discharge Family  Type of [place] to enter  Entrance  Stairs-Number of Steps 2  Entrance Stairs-Rails Right  [place] One level  Bathroom Shower/Tub Walk-in shower  Bathroom Toilet Handicapped height  Bathroom Accessibility Yes  Home Equipment Shower seat;Toilet riser  Additional Comments assist from spouse at home, daughter is a PT  Prior Function  Prior Level of Function  Independent/Modified Independent  Communication  Communication No difficulties  Pain Assessment  Pain Assessment No/denies pain  Cognition  Arousal/Alertness Awake/alert  Behavior During Therapy WFL for tasks assessed/performed  Overall Cognitive Status Within Functional Limits for tasks assessed  Upper Extremity Assessment  Upper Extremity Assessment Overall WFL for tasks assessed  Lower Extremity Assessment  Lower Extremity Assessment Overall WFL for tasks assessed (No strength assymetry in bil LE's, 4/5 or better for ankle dorsiflexion, knee extension, flexion.)  Cervical / Trunk Assessment  Cervical / Trunk Assessment Normal  Bed Mobility  Overal bed mobility Modified Independent  General bed mobility comments pt taking extra time, no assist needed for supine<>sit. pt able to move in bed for dix hallpike testing without assist.  Transfers  Overall transfer level Needs assistance  Equipment used None  Transfers Sit to/from Stand  Sit to Stand Min guard  General transfer comment close gaurd for  safety due to c/o dizziness. pt steady with rise and completed power up without assist.  Ambulation/Gait  Ambulation/Gait assistance Min guard  Gait Distance (Feet) 160 Feet  Assistive device None  Gait Pattern/deviations Step-through pattern;Decreased stride length;Decreased dorsiflexion - left;Decreased dorsiflexion - right;Shuffle  Gait velocity decr  Balance  Overall balance assessment Needs assistance  Sitting-balance support Feet supported  Sitting balance-Leahy Scale Good  Standing balance support During functional activity;Reliant on assistive device  for balance;No upper extremity supported  Standing balance-Leahy Scale Fair  Standing balance comment shuffled cautious steps  PT - End of Session  Equipment Utilized During Treatment Gait belt  Activity Tolerance Patient tolerated treatment well  Patient left in bed;with call bell/phone within reach;with bed alarm set;with family/visitor present  Nurse Communication Mobility status  PT Assessment  PT Recommendation/Assessment Patient needs continued PT services  PT Visit Diagnosis Unsteadiness on feet (R26.81);Dizziness and giddiness (R42);Other abnormalities of gait and mobility (R26.89)  PT Problem List Decreased mobility;Decreased balance;Decreased activity tolerance  PT Plan  PT Frequency (ACUTE ONLY) Min 3X/week  PT Treatment/Interventions (ACUTE ONLY) DME instruction;Gait training;Stair training;Functional mobility training;Therapeutic activities;Therapeutic exercise;Balance training;Neuromuscular re-education;Patient/family education  AM-PAC PT "6 Clicks" Mobility Outcome Measure (Version 2)  Help needed turning from your back to your side while in a flat bed without using bedrails? 4  Help needed moving from lying on your back to sitting on the side of a flat bed without using bedrails? 4  Help needed moving to and from a bed to a chair (including a wheelchair)? 3  Help needed standing up from a chair using your arms (e.g., wheelchair or bedside chair)? 3  Help needed to walk in hospital room? 3  Help needed climbing 3-5 steps with a railing?  3  6 Click Score 20  Consider Recommendation of Discharge To: Home with no services  Progressive Mobility  What is the highest level of mobility based on the progressive mobility assessment? Level 5 (Walks with assist in room/hall) - Balance while stepping forward/back and can walk in room with assist - Complete  Mobility Out of bed for toileting;Out of bed to chair with meals;Ambulated with assistance in room;Ambulated with assistance in  hallway  PT Recommendation  Follow Up Recommendations Outpatient PT (Vestibular OPPT, ENT follow up)  Assistance recommended at discharge Intermittent Supervision/Assistance  Functional Status Assessment Patient has had a recent decline in their functional status and demonstrates the ability to make significant improvements in function in a reasonable and predictable amount of time.  PT equipment None recommended by PT  Individuals Consulted  Consulted and Agree with Results and Recommendations Patient  Acute Rehab PT Goals  Patient Stated Goal stop feeling dizzy, be able to walk again  PT Goal Formulation With patient  Time For Goal Achievement 10/19/21  Potential to Achieve Goals Good  PT Time Calculation  PT Start Time (ACUTE ONLY) 0905  PT Stop Time (ACUTE ONLY) 0957  PT Time Calculation (min) (ACUTE ONLY) 52 min  PT General Charges  $$ ACUTE PT VISIT 1 Visit  PT Evaluation  $PT Eval Low Complexity 1 Low  PT Treatments  $Gait Training 8-22 mins  $Physical Performance Test 8-22 mins    Verner Mould, DPT Acute Rehabilitation Services Office [phone] Pager [phone]   Jacques Navy 10/05/2021, 2:50 PM

## 2021-10-05 NOTE — TOC Transition Note (Signed)
Transition of Care [facility]) - CM/SW Discharge Note   Patient Details  Name: Maureen Elliott MRN: [id number] Date of Birth: 02/08/1946  [facility] CM/SW Contact:  Dessa Phi, RN Phone Number: 10/05/2021, 3:05 PM   Clinical Narrative: Referral for otpt vestibular rehab-attending has already placed referral. No CM needs.      Final next level of care: OP Rehab Barriers to Discharge: No Barriers Identified   Patient Goals and CMS Choice        Discharge Placement                       Discharge Plan and Services                                     Social Determinants of Health (SDOH) Interventions     Readmission Risk Interventions No flowsheet data found.

## 2021-10-05 NOTE — Progress Notes (Signed)
RN contacted Dr. Marylyn Ishihara due to patient having soft tissue swelling of the anterior neck. Husband reports this is abnormal for the patient. See new orders.

## 2021-10-05 NOTE — Care Management Obs Status (Signed)
MEDICARE OBSERVATION STATUS NOTIFICATION   Patient Details  Name: Maureen Elliott MRN: [id number] Date of Birth: August 25, 1946   Medicare Observation Status Notification Given:  Yes    MahabirOlegario Messier, RN 10/05/2021, 3:08 PM

## 2021-10-06 ENCOUNTER — Ambulatory Visit (INDEPENDENT_AMBULATORY_CARE_PROVIDER_SITE_OTHER): Payer: Medicare PPO | Admitting: Bariatrics

## 2021-10-07 DIAGNOSIS — I480 Paroxysmal atrial fibrillation: Secondary | ICD-10-CM | POA: Diagnosis not present

## 2021-10-07 DIAGNOSIS — R42 Dizziness and giddiness: Secondary | ICD-10-CM | POA: Diagnosis not present

## 2021-10-11 NOTE — Discharge Summary (Signed)
Physician Discharge Summary   Patient: Maureen Elliott MRN: [id number] DOB: 13-May-1946  Admit date:     10/04/2021  Discharge date: 10/05/2021  Discharge Physician: Berle Mull   PCP: Kathyrn Lass, MD   Recommendations at discharge:   Follow-up with PCP in 1 week.  Discharge Diagnoses Principal Problem:   Vertigo Active Problems:   COVID-19 virus infection  Peripheral vertigo Symptoms completely resolved. No neurofocal deficit on exam. Vestibular evaluation was also unremarkable. Outpatient referral to ENT. Continue meclizine. Orthostatic vitals negative.   Chest pain Troponin negative. EKG shows no evidence of acute ischemia. Echocardiogram preserved EF.  No wall motion abnormality.   Paroxsymal a fib Rate controlled. On Xarelto.   Glaucoma continue home regimen   HLD continue home regimen   Prediabetes hyperglycemic today; check A1c  COVID test positive. Patient had a COVID infection almost a month ago. CT value is 39. Incidentally positive right now. Does not have any symptoms. Is anticoagulation.      Consultants: none Procedures performed: none  Disposition: Home Diet recommendation: Cardiac diet  DISCHARGE MEDICATION: Allergies as of 10/05/2021       Reactions   Amoxicillin Diarrhea   Normal adverse effect   Attapulgite Diarrhea   Caffeine Other (See Comments)   Avoid due to glaucoma   Dorzolamide Other (See Comments)   Severe eye irritation   Fish Oil    Other reaction(s): due to xarelto   Keflex [cephalexin] Diarrhea   Normal adverse effect   Levaquin [levofloxacin] Other (See Comments)   Joint pain and stiffness   Maxitrol [neomycin-polymyxin-dexameth] Other (See Comments)   Burns eyes   Methotrexate Derivatives Other (See Comments)   Hearing damage   Nsaids    Other reaction(s): due to being on Xarelto   Prednisone Other (See Comments)   Avoid due to glaucoma   Restasis [cyclosporine] Other (See Comments)   Burns eyes    Rofecoxib    Other reaction(s): Heart palpitations / PVC's   Valtrex [valacyclovir] Swelling, Other (See Comments)   Face swelling, headache on left side, increased eye pressure        Medication List     STOP taking these medications    doxycycline 100 MG capsule Commonly known as: VIBRAMYCIN       TAKE these medications    acetaminophen 325 MG tablet Commonly known as: TYLENOL Take 650 mg by mouth every 6 (six) hours as needed for mild pain, fever or headache.   albuterol 108 (90 Base) MCG/ACT inhaler Commonly known as: VENTOLIN HFA Inhale 2 puffs into the lungs every 4 (four) hours as needed for wheezing or shortness of breath.   brimonidine 0.2 % ophthalmic solution Commonly known as: ALPHAGAN Place 1 drop into both eyes 3 (three) times daily.   conjugated estrogens 0.625 MG/GM vaginal cream Commonly known as: PREMARIN Place 1 Applicatorful vaginally 2 (two) times a week.   meclizine 25 MG tablet Commonly known as: ANTIVERT Take 1 tablet (25 mg total) by mouth 3 (three) times daily as needed for dizziness.   melatonin 5 MG Tabs Take 5 mg by mouth at bedtime.   multivitamin with minerals Tabs tablet Take 1 tablet by mouth daily.   pilocarpine 1 % ophthalmic solution Commonly known as: PILOCAR Place 1 drop into both eyes 3 (three) times daily.   sodium chloride 0.65 % Soln nasal spray Commonly known as: OCEAN Place 1 spray into both nostrils as needed for congestion.   timolol 0.5 % ophthalmic solution Commonly  known as: TIMOPTIC Place 1 drop into both eyes 2 (two) times daily.   Tretinoin 0.075 % Crea Apply 1 application topically daily. To prevent  nasal warts   Vitamin D 50 MCG (2000 UT) Caps Take 1 capsule (2,000 Units total) by mouth daily.   Xarelto 20 MG Tabs tablet Generic drug: rivaroxaban Take 20 mg by mouth daily.        Follow-up Information     Kathyrn Lass, MD. Schedule an appointment as soon as possible for a visit in 1  week(s).   Specialty: Family Medicine Contact information: [place] [phone]         Leta Baptist, MD. Schedule an appointment as soon as possible for a visit.   Specialty: Otolaryngology Why: As needed Contact information: [address] [place] [address] [phone]                 Discharge Exam: Danley Danker Weights   10/04/21 1631  Weight: 66.5 kg   Vitals:   10/04/21 2021 10/05/21 0007 10/05/21 0401 10/05/21 1345  BP: (!) 122/51 (!) 113/51 (!) 122/58 (!) 109/55  Pulse: 65 61 61 85  Resp: 18 18 18 18   Temp: 98.3 F (36.8 C) 97.9 F (36.6 C) 98.1 F (36.7 C) 98.3 F (36.8 C)  TempSrc: Oral Oral Oral Oral  SpO2: 96% 100% 97% 100%  Weight:      Height:        General: Appear in mild distress, no Rash; Oral Mucosa Clear, moist. no Abnormal Neck Mass Or lumps, Conjunctiva normal  Cardiovascular: S1 and S2 Present, no Murmur, Respiratory: good respiratory effort, Bilateral Air entry present and CTA, no Crackles, no wheezes Abdomen: Bowel Sound present, Soft and no tenderness Extremities: no Pedal edema Neurology: alert and oriented to time, place, and person affect appropriate. no new focal deficit Gait not checked due to patient safety concerns   Condition at discharge: good  The results of significant diagnostics from this hospitalization (including imaging, microbiology, ancillary and laboratory) are listed below for reference.   Imaging Studies: CT Head Wo Contrast  Result Date: 10/04/2021 CLINICAL DATA:  Dizziness, persistent/recurrent, cardiac or vascular cause suspected; Maxillary/facial abscess recurrent sinusitis EXAM: CT HEAD WITHOUT CONTRAST CT MAXILLOFACIAL WITHOUT CONTRAST TECHNIQUE: Multidetector CT imaging of the head and maxillofacial structures were performed using the standard protocol without intravenous contrast. Multiplanar CT image reconstructions of the maxillofacial structures were also  generated. COMPARISON:  None. FINDINGS: CT HEAD FINDINGS Brain: No evidence of acute intracranial hemorrhage or extra-axial collection.No evidence of mass lesion/concern mass effect.The ventricles are unchanged in size.Scattered subcortical and periventricular white matter hypodensities, nonspecific but likely sequela of chronic small vessel ischemic disease. Vascular: No hyperdense vessel or unexpected calcification. Skull: Unchanged lytic lesion in the left frontal calvarium with benign features. No acute fracture. Other: None. CT MAXILLOFACIAL FINDINGS Osseous: No fracture or mandibular dislocation. No destructive process. Partially visualized ACDF hardware. Orbits: Prior orbital surgeries. No acute traumatic or inflammatory finding. Sinuses: The paranasal sinuses are predominantly clear. Chronic small right mastoid effusion. Soft tissues: Unremarkable. IMPRESSION: No acute intracranial abnormality. Mild sequela of chronic small vessel ischemic disease. Predominantly clear paranasal sinuses. Chronic small right mastoid effusion. Electronically Signed   By: Maurine Simmering M.D.   On: 10/04/2021 10:55   CT Head Wo Contrast  Result Date: 09/12/2021 CLINICAL DATA:  Dizziness, persistent/recurrent, cardiac or vascular cause suspected EXAM: CT HEAD WITHOUT CONTRAST TECHNIQUE: Contiguous axial images were obtained  from the base of the skull through the vertex without intravenous contrast. COMPARISON:  03/06/2021 FINDINGS: Brain: Mild age related volume loss. No acute intracranial abnormality. Specifically, no hemorrhage, hydrocephalus, mass lesion, acute infarction, or significant intracranial injury. Vascular: No hyperdense vessel or unexpected calcification. Skull: No acute calvarial abnormality. Sinuses/Orbits: No acute findings Other: None IMPRESSION: No acute intracranial abnormality. Electronically Signed   By: Rolm Baptise M.D.   On: 09/12/2021 11:18   CT SOFT TISSUE NECK W CONTRAST  Result Date:  10/04/2021 CLINICAL DATA:  Initial evaluation for soft tissue swelling, infection suspected. EXAM: CT NECK WITH CONTRAST TECHNIQUE: Multidetector CT imaging of the neck was performed using the standard protocol following the bolus administration of intravenous contrast. RADIATION DOSE REDUCTION: This exam was performed according to the departmental dose-optimization program which includes automated exposure control, adjustment of the mA and/or kV according to patient size and/or use of iterative reconstruction technique. CONTRAST:  16mL OMNIPAQUE IOHEXOL 350 MG/ML SOLN COMPARISON:  None available. FINDINGS: Pharynx and larynx: Oral cavity within normal limits. No visible acute abnormality about the dentition. Oropharynx and nasopharynx within normal limits. No retropharyngeal collection or swelling. Epiglottis within normal limits. Hypopharynx and supraglottic larynx within normal limits. Glottis normal. Subglottic airway patent clear. Salivary glands: Salivary glands including the parotid and submandibular glands are within normal limits. Thyroid: Few thyroid nodules noted, largest of which measures 1.2 cm on the right. Findings of doubtful significance given size and patient age, with no follow-up imaging recommended (ref: J Am Coll Radiol. 2015 Feb;12(2): 143-50). Lymph nodes: No enlarged or pathologic adenopathy within the neck. Vascular: Normal intravascular enhancement seen throughout the neck. Mild atheromatous change about the aortic arch and carotid bifurcations. Limited intracranial: Unremarkable. Visualized orbits: Prior bilateral ocular lens replacement. Postsurgical changes noted at the right globe. Visualized globes and orbital soft tissues demonstrate no acute finding. Mastoids and visualized paranasal sinuses: Visualized paranasal sinuses are clear. Trace right mastoid effusion noted, of doubtful significance. Left mastoid air cells clear. Middle ear cavities well pneumatized and free of fluid.  Skeleton: Prior ACDF at C5-C7 with solid arthrodesis. No discrete or worrisome osseous lesions. Degenerative change noted about the right TMJ. Upper chest: Cyst visualized upper chest demonstrates no acute finding. Partially visualized lungs are clear. Other: None. IMPRESSION: 1. Negative CT of the neck. No acute inflammatory changes or other abnormality identified. 2. Prior ACDF at C5-C7 with solid arthrodesis. Electronically Signed   By: Jeannine Boga M.D.   On: 10/04/2021 20:12   DG Chest Port 1 View  Result Date: 09/12/2021 CLINICAL DATA:  Chest pain EXAM: PORTABLE CHEST 1 VIEW COMPARISON:  02/11/2018 FINDINGS: The heart size and mediastinal contours are within normal limits. Both lungs are clear. The visualized skeletal structures are unremarkable. IMPRESSION: Negative. Electronically Signed   By: Rolm Baptise M.D.   On: 09/12/2021 11:17   ECHOCARDIOGRAM COMPLETE  Result Date: 10/05/2021    ECHOCARDIOGRAM REPORT   Patient Name:   KAMARRI ASAI Date of Exam: 10/05/2021 Medical Rec #:  [id number]         Height:       63.0 in Accession #:    [id number]        Weight:       146.6 lb Date of Birth:  November 21, 1945         BSA:          1.695 m Patient Age:    34 years          BP:  122/58 mmHg Patient Gender: F                 HR:           61 bpm. Exam Location:  Inpatient Procedure: 2D Echo Indications:    Chest pain  History:        Patient has no prior history of Echocardiogram examinations. DVT                 Covid positive.  Sonographer:    Arlyss Gandy Referring Phys: [id number] [place]  1. Left ventricular ejection fraction, by estimation, is 60 to 65%. The left ventricle has normal function. The left ventricle has no regional wall motion abnormalities. Left ventricular diastolic parameters are consistent with Grade I diastolic dysfunction (impaired relaxation).  2. Right ventricular systolic function is normal. The right ventricular size is normal. There is normal  pulmonary artery systolic pressure. The estimated right ventricular systolic pressure is 123456 mmHg.  3. The mitral valve is normal in structure. Trivial mitral valve regurgitation. No evidence of mitral stenosis.  4. The aortic valve is normal in structure. Aortic valve regurgitation is trivial. No aortic stenosis is present.  5. Aortic dilatation noted. There is mild dilatation of the ascending aorta, measuring 39 mm.  6. The inferior vena cava is normal in size with greater than 50% respiratory variability, suggesting right atrial pressure of 3 mmHg. FINDINGS  Left Ventricle: Left ventricular ejection fraction, by estimation, is 60 to 65%. The left ventricle has normal function. The left ventricle has no regional wall motion abnormalities. The left ventricular internal cavity size was normal in size. There is  no left ventricular hypertrophy. Left ventricular diastolic parameters are consistent with Grade I diastolic dysfunction (impaired relaxation). Normal left ventricular filling pressure. Right Ventricle: The right ventricular size is normal. No increase in right ventricular wall thickness. Right ventricular systolic function is normal. There is normal pulmonary artery systolic pressure. The tricuspid regurgitant velocity is 2.13 m/s, and  with an assumed right atrial pressure of 3 mmHg, the estimated right ventricular systolic pressure is 123456 mmHg. Left Atrium: Left atrial size was normal in size. Right Atrium: Right atrial size was normal in size. Pericardium: There is no evidence of pericardial effusion. Mitral Valve: The mitral valve is normal in structure. Trivial mitral valve regurgitation. No evidence of mitral valve stenosis. Tricuspid Valve: The tricuspid valve is normal in structure. Tricuspid valve regurgitation is trivial. No evidence of tricuspid stenosis. Aortic Valve: The aortic valve is normal in structure. Aortic valve regurgitation is trivial. No aortic stenosis is present. Aortic valve mean  gradient measures 4.0 mmHg. Aortic valve peak gradient measures 10.1 mmHg. Aortic valve area, by VTI measures 2.68 cm. Pulmonic Valve: The pulmonic valve was normal in structure. Pulmonic valve regurgitation is trivial. No evidence of pulmonic stenosis. Aorta: Aortic dilatation noted. There is mild dilatation of the ascending aorta, measuring 39 mm. Venous: The inferior vena cava is normal in size with greater than 50% respiratory variability, suggesting right atrial pressure of 3 mmHg. IAS/Shunts: No atrial level shunt detected by color flow Doppler.  LEFT VENTRICLE PLAX 2D LVIDd:         3.90 cm   Diastology LVIDs:         2.60 cm   LV e' medial:    7.29 cm/s LV PW:         1.10 cm   LV E/e' medial:  11.7 LV IVS:  1.10 cm   LV e' lateral:   6.09 cm/s LVOT diam:     2.00 cm   LV E/e' lateral: 14.0 LV SV:         78 LV SV Index:   46 LVOT Area:     3.14 cm  RIGHT VENTRICLE             IVC RV Basal diam:  3.90 cm     IVC diam: 1.70 cm RV S prime:     14.30 cm/s LEFT ATRIUM             Index        RIGHT ATRIUM           Index LA diam:        2.80 cm 1.65 cm/m   RA Area:     15.00 cm LA Vol (A2C):   38.5 ml 22.72 ml/m  RA Volume:   37.90 ml  22.37 ml/m LA Vol (A4C):   34.8 ml 20.54 ml/m LA Biplane Vol: 37.1 ml 21.89 ml/m  AORTIC VALVE AV Area (Vmax):    2.21 cm AV Area (Vmean):   2.28 cm AV Area (VTI):     2.68 cm AV Vmax:           159.00 cm/s AV Vmean:          94.400 cm/s AV VTI:            0.291 m AV Peak Grad:      10.1 mmHg AV Mean Grad:      4.0 mmHg LVOT Vmax:         112.00 cm/s LVOT Vmean:        68.600 cm/s LVOT VTI:          0.248 m LVOT/AV VTI ratio: 0.85  AORTA Ao Root diam: 3.60 cm Ao Asc diam:  3.90 cm MITRAL VALVE               TRICUSPID VALVE MV Area (PHT): 2.37 cm    TR Peak grad:   18.1 mmHg MV Decel Time: 320 msec    TR Vmax:        213.00 cm/s MV E velocity: 85.30 cm/s MV A velocity: 98.50 cm/s  SHUNTS MV E/A ratio:  0.87        Systemic VTI:  0.25 m                             Systemic Diam: 2.00 cm Fransico Him MD Electronically signed by Fransico Him MD Signature Date/Time: 10/05/2021/1:42:28 PM    Final    CT Maxillofacial Wo Contrast  Result Date: 10/04/2021 CLINICAL DATA:  Dizziness, persistent/recurrent, cardiac or vascular cause suspected; Maxillary/facial abscess recurrent sinusitis EXAM: CT HEAD WITHOUT CONTRAST CT MAXILLOFACIAL WITHOUT CONTRAST TECHNIQUE: Multidetector CT imaging of the head and maxillofacial structures were performed using the standard protocol without intravenous contrast. Multiplanar CT image reconstructions of the maxillofacial structures were also generated. COMPARISON:  None. FINDINGS: CT HEAD FINDINGS Brain: No evidence of acute intracranial hemorrhage or extra-axial collection.No evidence of mass lesion/concern mass effect.The ventricles are unchanged in size.Scattered subcortical and periventricular white matter hypodensities, nonspecific but likely sequela of chronic small vessel ischemic disease. Vascular: No hyperdense vessel or unexpected calcification. Skull: Unchanged lytic lesion in the left frontal calvarium with benign features. No acute fracture. Other: None. CT MAXILLOFACIAL FINDINGS Osseous: No fracture or mandibular dislocation. No destructive process. Partially visualized  ACDF hardware. Orbits: Prior orbital surgeries. No acute traumatic or inflammatory finding. Sinuses: The paranasal sinuses are predominantly clear. Chronic small right mastoid effusion. Soft tissues: Unremarkable. IMPRESSION: No acute intracranial abnormality. Mild sequela of chronic small vessel ischemic disease. Predominantly clear paranasal sinuses. Chronic small right mastoid effusion. Electronically Signed   By: Maurine Simmering M.D.   On: 10/04/2021 10:55    Microbiology: Results for orders placed or performed during the hospital encounter of 10/04/21  Resp Panel by RT-PCR (Flu A&B, Covid) Nasopharyngeal Swab     Status: Abnormal   Collection Time: 10/04/21  1:06  PM   Specimen: Nasopharyngeal Swab; Nasopharyngeal(NP) swabs in vial transport medium  Result Value Ref Range Status   SARS Coronavirus 2 by RT PCR POSITIVE (A) NEGATIVE Final    Comment: (NOTE) SARS-CoV-2 target nucleic acids are DETECTED.  The SARS-CoV-2 RNA is generally detectable in upper respiratory specimens during the acute phase of infection. Positive results are indicative of the presence of the identified virus, but do not rule out bacterial infection or co-infection with other pathogens not detected by the test. Clinical correlation with patient history and other diagnostic information is necessary to determine patient infection status. The expected result is Negative.  Fact Sheet for Patients: EntrepreneurPulse.com.au  Fact Sheet for Healthcare Providers: IncredibleEmployment.be  This test is not yet approved or cleared by the [place] FDA and  has been authorized for detection and/or diagnosis of SARS-CoV-2 by FDA under an Emergency Use Authorization (EUA).  This EUA will remain in effect (meaning this test can be used) for the duration of  the COVID-19 declaration under Section 564(b)(1) of the A ct, 21 U.S.C. section 360bbb-3(b)(1), unless the authorization is terminated or revoked sooner.     Influenza A by PCR NEGATIVE NEGATIVE Final   Influenza B by PCR NEGATIVE NEGATIVE Final    Comment: (NOTE) The Xpert Xpress SARS-CoV-2/FLU/RSV plus assay is intended as an aid in the diagnosis of influenza from Nasopharyngeal swab specimens and should not be used as a sole basis for treatment. Nasal washings and aspirates are unacceptable for Xpert Xpress SARS-CoV-2/FLU/RSV testing.  Fact Sheet for Patients: EntrepreneurPulse.com.au  Fact Sheet for Healthcare Providers: IncredibleEmployment.be  This test is not yet approved or cleared by the [place] FDA and has been authorized for  detection and/or diagnosis of SARS-CoV-2 by FDA under an Emergency Use Authorization (EUA). This EUA will remain in effect (meaning this test can be used) for the duration of the COVID-19 declaration under Section 564(b)(1) of the Act, 21 U.S.C. section 360bbb-3(b)(1), unless the authorization is terminated or revoked.  Performed at [facility], [place] [address] [place], [place] [address]     Labs: CBC: Recent Labs  Lab 10/05/21 0427  WBC 8.0  NEUTROABS 5.2  HGB 12.7  HCT 38.6  MCV 93.5  PLT [id number]   Basic Metabolic Panel: Recent Labs  Lab 10/05/21 0427  NA 139  K 3.5  CL 104  CO2 25  GLUCOSE 97  BUN 17  CREATININE 0.72  CALCIUM 9.6   Liver Function Tests: Recent Labs  Lab 10/05/21 0427  AST 19  ALT 16  ALKPHOS 54  BILITOT 0.7  PROT 6.7  ALBUMIN 3.4*   CBG: No results for input(s): GLUCAP in the last 168 hours.  Discharge time spent: greater than 30 minutes.  Signed: Berle Mull, MD [facility]

## 2021-10-17 DIAGNOSIS — B078 Other viral warts: Secondary | ICD-10-CM | POA: Diagnosis not present

## 2021-10-17 DIAGNOSIS — L570 Actinic keratosis: Secondary | ICD-10-CM | POA: Diagnosis not present

## 2021-10-19 DIAGNOSIS — J310 Chronic rhinitis: Secondary | ICD-10-CM | POA: Diagnosis not present

## 2021-10-19 DIAGNOSIS — J343 Hypertrophy of nasal turbinates: Secondary | ICD-10-CM | POA: Diagnosis not present

## 2021-10-31 ENCOUNTER — Other Ambulatory Visit: Payer: Self-pay

## 2021-10-31 ENCOUNTER — Encounter (INDEPENDENT_AMBULATORY_CARE_PROVIDER_SITE_OTHER): Payer: Self-pay | Admitting: Bariatrics

## 2021-10-31 ENCOUNTER — Ambulatory Visit (INDEPENDENT_AMBULATORY_CARE_PROVIDER_SITE_OTHER): Payer: Medicare PPO | Admitting: Bariatrics

## 2021-10-31 VITALS — BP 139/62 | HR 65 | Temp 98.0°F | Ht 63.0 in | Wt 144.0 lb

## 2021-10-31 DIAGNOSIS — E8881 Metabolic syndrome: Secondary | ICD-10-CM

## 2021-10-31 DIAGNOSIS — E7800 Pure hypercholesterolemia, unspecified: Secondary | ICD-10-CM | POA: Diagnosis not present

## 2021-10-31 DIAGNOSIS — E669 Obesity, unspecified: Secondary | ICD-10-CM | POA: Diagnosis not present

## 2021-10-31 DIAGNOSIS — Z6830 Body mass index (BMI) 30.0-30.9, adult: Secondary | ICD-10-CM

## 2021-10-31 DIAGNOSIS — Z6825 Body mass index (BMI) 25.0-25.9, adult: Secondary | ICD-10-CM | POA: Diagnosis not present

## 2021-10-31 DIAGNOSIS — I480 Paroxysmal atrial fibrillation: Secondary | ICD-10-CM

## 2021-11-01 ENCOUNTER — Encounter: Payer: Self-pay | Admitting: *Deleted

## 2021-11-01 ENCOUNTER — Encounter: Payer: Self-pay | Admitting: Cardiology

## 2021-11-01 ENCOUNTER — Telehealth: Payer: Self-pay | Admitting: *Deleted

## 2021-11-01 ENCOUNTER — Ambulatory Visit: Payer: Medicare PPO | Admitting: Cardiology

## 2021-11-01 VITALS — BP 128/60 | HR 72 | Ht 63.0 in | Wt 149.4 lb

## 2021-11-01 DIAGNOSIS — R079 Chest pain, unspecified: Secondary | ICD-10-CM

## 2021-11-01 DIAGNOSIS — I480 Paroxysmal atrial fibrillation: Secondary | ICD-10-CM

## 2021-11-01 MED ORDER — XARELTO 20 MG PO TABS: 20.0000 mg | ORAL_TABLET | Freq: Every day | ORAL | 1 refills | Status: DC

## 2021-11-01 MED ORDER — METOPROLOL TARTRATE 100 MG PO TABS: 100.0000 mg | ORAL_TABLET | Freq: Once | ORAL | 0 refills | Status: DC

## 2021-11-01 NOTE — Addendum Note (Signed)
Addended by: Antonieta Iba on: 11/01/2021 02:09 PM   Modules accepted: Orders

## 2021-11-01 NOTE — Progress Notes (Signed)
Cardiology CONSULT Note    Date:  11/01/2021   ID:  Maureen Elliott, DOB Jun 18, 1946, MRN [id number]  PCP:  Maureen Lass, MD  Cardiologist:  Maureen Him, MD   Chief Complaint  Patient presents with   New Patient (Initial Visit)    Chest pain and PAF    History of Present Illness:  Maureen Elliott is a 76 y.o. female who is being seen today for the evaluation of dizziness and chest pain at the request of Maureen Lass, MD.  This is a 76 year old female with a history of left lower extremity DVT, gestational diabetes, hyperlipidemia.  She presented to the ER in December with vertigo and was dx with afib and started on Xarelto.    She recently presented to the emergency room  again in January with acute dizziness and chest pain and was diagnosed with vertigo.  She also was having chest pain at the time.  Troponins were negative and EKG showed no evidence of ischemia.  She had been dx with COVID a few weeks prior and was started on Albuterol due to cough and URI sx.  2D echo showed normal LV function with EF 60 to 65% with grade 1 diastolic dysfunction, trivial MR and AR and mildly dilated ascending aorta at 39 mm.  12 lead EKG and tele monitoring on last hospitalization did not show any recurrent PAF.  She is now referred for evaluation of her PAF.  She denies any chest pain or pressure, SOB, DOE, PND, orthopnea, LE edema, dizziness, palpitations or syncope.  She is compliant with her meds and is tolerating meds with no SE.      Past Medical History:  Diagnosis Date   Complication of anesthesia    be careful with intubation due to previous cervical neck surgery   Cystocele    Diverticulitis    Diverticulosis    DVT (deep venous thrombosis) (HCC)    left leg   Gallbladder problem    Gestational diabetes    Glaucoma    Hemorrhoids internal and external   High cholesterol    History of gestational diabetes yrs ago   Joint pain    Lactose intolerance    Nocturia     Osteoarthritis    PAF (paroxysmal atrial fibrillation) (HCC)    Partial hearing loss    PONV (postoperative nausea and vomiting) 2013   n/v after phenergan, injectable phenergan helps with nausea   Prediabetes     Past Surgical History:  Procedure Laterality Date   ABDOMINAL HYSTERECTOMY  2013   ovaries removed    BLADDER SURGERY     BREAST BIOPSY Bilateral 2001   cervical fusion C 5, C 6 and C 7  2004   colonscopy  feb 2015   blood in stool, just was hemorrhoids   CYSTOCELE REPAIR N/A 01/13/2014   Procedure: CYSTOSCOPY, REPAIR CYSTOCELE VAULT PROLAPSE/GRAFT;  Surgeon: Reece Packer, MD;  Location: [facility];  Service: Urology;  Laterality: N/A;   EYE SURGERY Bilateral 8 yrs ago   lens replacement for cataract   GLAUCOMA SURGERY     SEPTOPLASTY Bilateral 03/01/2020   Procedure: NASAL SEPTOPLASTY;  Surgeon: Leta Baptist, MD;  Location: [place];  Service: ENT;  Laterality: Bilateral;   tummy tuck with mesh  6 yrs ago   large abdominal is present    Current Medications: Current Meds  Medication Sig   acetaminophen (TYLENOL) 325 MG tablet Take 650 mg by mouth every 6 (six)  hours as needed for mild pain, fever or headache.   brimonidine (ALPHAGAN) 0.2 % ophthalmic solution Place 1 drop into both eyes 3 (three) times daily.   CALCIUM 600 1500 (600 Ca) MG TABS tablet SMARTSIG:1 Tablet(s) By Mouth   Cholecalciferol (VITAMIN D) 50 MCG (2000 UT) CAPS Take 1 capsule (2,000 Units total) by mouth daily.   CHOLESTOFF PLUS 450 MG CAPS SMARTSIG:1 Capsule(s) By Mouth   conjugated estrogens (PREMARIN) vaginal cream Place 1 Applicatorful vaginally 2 (two) times a week.   melatonin 5 MG TABS Take 5 mg by mouth at bedtime.   Multiple Vitamin (MULTIVITAMIN WITH MINERALS) TABS tablet Take 1 tablet by mouth daily.   pilocarpine (PILOCAR) 1 % ophthalmic solution Place 1 drop into both eyes 3 (three) times daily.   RA GLUCOSAMINE SULFATE 500 MG TABS SMARTSIG:1 By Mouth   timolol  (TIMOPTIC) 0.5 % ophthalmic solution Place 1 drop into both eyes 2 (two) times daily.   Tretinoin 0.075 % CREA Apply 1 application topically daily. To prevent  nasal warts   XARELTO 20 MG TABS tablet Take 20 mg by mouth daily.    Allergies:   Amoxicillin, Attapulgite, Caffeine, Dorzolamide, Fish oil, Keflex [cephalexin], Levaquin [levofloxacin], Maxitrol [neomycin-polymyxin-dexameth], Methotrexate derivatives, Nsaids, Prednisone, Restasis [cyclosporine], Rofecoxib, and Valtrex [valacyclovir]   Social History   Socioeconomic History   Marital status: Married    Spouse name: Maureen Elliott "will"   Number of children: 1   Years of education: Not on file   Highest education level: Not on file  Occupational History   Not on file  Tobacco Use   Smoking status: Never   Smokeless tobacco: Never  Vaping Use   Vaping Use: Never used  Substance and Sexual Activity   Alcohol use: Yes    Comment: occasional wine with dinner   Drug use: No   Sexual activity: Not on file  Other Topics Concern   Not on file  Social History Narrative   Right handed    Lives with husband   Social Determinants of Health   Financial Resource Strain: Not on file  Food Insecurity: Not on file  Transportation Needs: Not on file  Physical Activity: Not on file  Stress: Not on file  Social Connections: Not on file     Family History:  The patient's family history includes Alcohol abuse in her father; Breast cancer in her maternal aunt, maternal aunt, and maternal aunt; Cancer in her father and mother; Hypertension in her father; Obesity in her father.   ROS:   Please see the history of present illness.    ROS All other systems reviewed and are negative.  No flowsheet data found.     PHYSICAL EXAM:   VS:  BP 128/60    Pulse 72    Ht 5\' 3"  (1.6 m)    Wt 149 lb 6.4 oz (67.8 kg)    SpO2 96%    BMI 26.47 kg/m    GEN: Well nourished, well developed, in no acute distress  HEENT: normal  Neck: no JVD, carotid  bruits, or masses Cardiac: RRR; no murmurs, rubs, or gallops,no edema.  Intact distal pulses bilaterally.  Respiratory:  clear to auscultation bilaterally, normal work of breathing GI: soft, nontender, nondistended, + BS MS: no deformity or atrophy  Skin: warm and dry, no rash Neuro:  Alert and Oriented x 3, Strength and sensation are intact Psych: euthymic mood, full affect  Wt Readings from Last 3 Encounters:  11/01/21 149 lb 6.4 oz (  67.8 kg)  10/31/21 144 lb (65.3 kg)  10/04/21 146 lb 9.7 oz (66.5 kg)      Studies/Labs Reviewed:   EKG:  EKG is not ordered today.   Recent Labs: 09/12/2021: Magnesium 1.9 10/05/2021: ALT 16; BUN 17; Creatinine, Ser 0.72; Hemoglobin 12.7; Platelets 228; Potassium 3.5; Sodium 139   Lipid Panel    Component Value Date/Time   CHOL 153 11/09/2020 0935   TRIG 50 11/09/2020 0935   HDL 54 11/09/2020 0935   LDLCALC 88 11/09/2020 0935     CHA2DS2-VASc Score = 2  This indicates a 2.2% annual risk of stroke. The patient's score is based upon: CHF History: 0 HTN History: 0 Diabetes History: 0 Stroke History: 0 Vascular Disease History: 0 Age Score: 1 Gender Score: 1    Additional studies/ records that were reviewed today include:  Hospital notes from January 23 and 2D echo    ASSESSMENT:    1. Chest pain of uncertain etiology   2. PAF (paroxysmal atrial fibrillation) (HCC)      PLAN:  In order of problems listed above:  Chest pain -This occurred in the setting of recent COVID infection as well as atrial fibrillation -Serial troponin levels were normal and EKG was nonischemic -since she is back in NSR I will get a coronary CTA to define coronary anatomy  2.  PAF -She was recently started on Xarelto in December -noted to be back in NSR on 10/04/2021 -She has had some blood from her rectum that she has intermittently noticed on toilet paper>>I have asked her to see her PCP back for this -CHADS2VASC score is 2  (will be 3 in a few  days when she turns 76) so needs to be on long-term anticoagulation -Continue prescription drug management with Xarelto 20 mg daily. -2D echo showed normal LV function -Get an Itamar home sleep study to rule out sleep apnea -check event monitor to assess afib load  Time Spent: 20 minutes total time of encounter, including 15 minutes spent in face-to-face patient care on the date of this encounter. This time includes coordination of care and counseling regarding above mentioned problem list. Remainder of non-face-to-face time involved reviewing chart documents/testing relevant to the patient encounter and documentation in the medical record. I have independently reviewed documentation from referring provider  Medication Adjustments/Labs and Tests Ordered: Current medicines are reviewed at length with the patient today.  Concerns regarding medicines are outlined above.  Medication changes, Labs and Tests ordered today are listed in the Patient Instructions below.  There are no Patient Instructions on file for this visit.   Signed, Maureen Him, MD  11/01/2021 1:33 PM    [facility] [place], [place],   [address] Phone: [phone]; Fax: [phone]

## 2021-11-01 NOTE — Telephone Encounter (Deleted)
Pt was seen in the office today by Dr. Mayford Knife who ordered an Itamar Sleep study. I have set up the pt with the sleep study and have gone over all instructions and the tutorial with the pt and he husband. Both are aware once sleep study is approved, we like for the study to be done within 48 hours if possible. Both aware if study not done in 30 days and they have not called the office, they will be billed $100. Both aware if study denied and bo

## 2021-11-01 NOTE — Patient Instructions (Addendum)
Medication Instructions:  Your physician recommends that you continue on your current medications as directed. Please refer to the Current Medication list given to you today.  *If you need a refill on your cardiac medications before your next appointment, please call your pharmacy*  Lab Work: TODAY: BMET If you have labs (blood work) drawn today and your tests are completely normal, you will receive your results only by: [place] (if you have MyChart) OR A paper copy in the mail If you have any lab test that is abnormal or we need to change your treatment, we will call you to review the results.   Testing/Procedures: Your physician has recommended that you wear an event monitor. Event monitors are medical devices that record the hearts electrical activity. Doctors most often [place] these monitors to diagnose arrhythmias. Arrhythmias are problems with the speed or rhythm of the heartbeat. The monitor is a small, portable device. You can wear one while you do your normal daily activities. This is usually used to diagnose what is causing palpitations/syncope (passing out).  Your physician has recommended that you have a coronary CTA scan. Please see below for further instructions.   Your physician has recommended that you have a sleep study. This test records several body functions during sleep, including: brain activity, eye movement, oxygen and carbon dioxide blood levels, heart rate and rhythm, breathing rate and rhythm, the flow of air through your mouth and nose, snoring, body muscle movements, and chest and belly movement.  Follow-Up: At [facility], you and your health needs are our priority.  As part of our continuing mission to provide you with exceptional heart care, we have created designated Provider Care Teams.  These Care Teams include your primary Cardiologist (physician) and Advanced Practice Providers (APPs -  Physician Assistants and Nurse Practitioners) who all work  together to provide you with the care you need, when you need it.  Your next appointment:   6 month(s)  The format for your next appointment:   In Person  Provider:   Fransico Him, MD    Other Instructions   Your cardiac CT will be scheduled at one of the below locations:   [facility] [address] [place], [place] [address] [phone]  Please arrive at the [facility] main entrance (entrance A) of [facility] 30 minutes prior to test start time. You can use the FREE valet parking offered at the main entrance (encouraged to control the heart rate for the test) Proceed to the [facility] Radiology Department (first floor) to check-in and test prep.  Please follow these instructions carefully (unless otherwise directed):  On the Night Before the Test: Be sure to Drink plenty of water. Do not consume any caffeinated/decaffeinated beverages or chocolate 12 hours prior to your test. Do not take any antihistamines 12 hours prior to your test.  On the Day of the Test: Drink plenty of water until 1 hour prior to the test. Do not eat any food 4 hours prior to the test. You may take your regular medications prior to the test.  Take metoprolol (Lopressor) two hours prior to test. FEMALES- please wear underwire-free bra if available, avoid dresses & tight clothing       After the Test: Drink plenty of water. After receiving IV contrast, you may experience a mild flushed feeling. This is normal. On occasion, you may experience a mild rash up to 24 hours after the test. This is not dangerous. If this  occurs, you can take Benadryl 25 mg and increase your fluid intake. If you experience trouble breathing, this can be serious. If it is severe call 911 IMMEDIATELY. If it is mild, please call our office. If you take any of these medications: Glipizide/Metformin, Avandament, Glucavance, please do not take 48 hours after completing test unless otherwise  instructed.  We will call to schedule your test 2-4 weeks out understanding that some insurance companies will need an authorization prior to the service being performed.   For non-scheduling related questions, please contact the cardiac imaging nurse navigator should you have any questions/concerns: Marchia Bond, Cardiac Imaging Nurse Navigator Gordy Clement, Cardiac Imaging Nurse Navigator [facility] Direct Office Dial: [phone]   For scheduling needs, including cancellations and rescheduling, please call [place], [phone].  [employer] Cardiac Event Monitor Instructions Your physician has requested you wear your cardiac event monitor for 30 days. [employer] may call or text to confirm a shipping address. The monitor will be sent to a land address via UPS. [employer] will not ship a monitor to a PO BOX. It typically takes 3-5 days to receive your monitor after it has been enrolled. [employer] will assist with USPS tracking if your package is delayed. The telephone number for [employer] is [phone]. Once you have received your monitor, please review the enclosed instructions. Instruction tutorials can also be viewed under help and settings on the enclosed cell phone. Your monitor has already been registered assigning a specific monitor serial # to you.  Applying the monitor Remove cell phone from case and turn it on. The cell phone works as Dealer and needs to be within [employer] of you at all times. The cell phone will need to be charged on a daily basis. We recommend you plug the cell phone into the enclosed charger at your bedside table every night.  Monitor batteries: You will receive two monitor batteries labelled #1 and #2. These are your recorders. Plug battery #2 onto the second connection on the enclosed charger. Keep one battery on the charger at all times. This will keep the monitor battery deactivated. It will also keep it  fully charged for when you need to switch your monitor batteries. A small light will be blinking on the battery emblem when it is charging. The light on the battery emblem will remain on when the battery is fully charged.  Open package of a Monitor strip. Insert battery #1 into black hood on strip and gently squeeze monitor battery onto connection as indicated in instruction booklet. Set aside while preparing skin.  Choose location for your strip, vertical or horizontal, as indicated in the instruction booklet. Shave to remove all hair from location. There cannot be any lotions, oils, powders, or colognes on skin where monitor is to be applied. Wipe skin clean with enclosed Saline wipe. Dry skin completely.  Peel paper labeled #1 off the back of the Monitor strip exposing the adhesive. Place the monitor on the chest in the vertical or horizontal position shown in the instruction booklet. One arrow on the monitor strip must be pointing upward. Carefully remove paper labeled #2, attaching remainder of strip to your skin. Try not to create any folds or wrinkles in the strip as you apply it.  Firmly press and release the circle in the center of the monitor battery. You will hear a small beep. This is turning the monitor battery on. The heart emblem on the monitor battery will light up every 5  seconds if the monitor battery in turned on and connected to the patient securely. Do not push and hold the circle down as this turns the monitor battery off. The cell phone will locate the monitor battery. A screen will appear on the cell phone checking the connection of your monitor strip. This may read poor connection initially but change to good connection within the next minute. Once your monitor accepts the connection you will hear a series of 3 beeps followed by a climbing crescendo of beeps. A screen will appear on the cell phone showing the two monitor strip placement options. Touch the picture that  demonstrates where you applied the monitor strip.  Your monitor strip and battery are waterproof. You are able to shower, bathe, or swim with the monitor on. They just ask you do not submerge deeper than 3 feet underwater. We recommend removing the monitor if you are swimming in a lake, river, or ocean.  Your monitor battery will need to be switched to a fully charged monitor battery approximately once a week. The cell phone will alert you of an action which needs to be made.  On the cell phone, tap for details to reveal connection status, monitor battery status, and cell phone battery status. The green dots indicates your monitor is in good status. A red dot indicates there is something that needs your attention.  To record a symptom, click the circle on the monitor battery. In 30-60 seconds a list of symptoms will appear on the cell phone. Select your symptom and tap save. Your monitor will record a sustained or significant arrhythmia regardless of you clicking the button. Some patients do not feel the heart rhythm irregularities. [employer] will notify us of any serious or critical events.  Refer to instruction booklet for instructions on switching batteries, changing strips, the Do not disturb or Pause features, or any additional questions.  Call [employer] at [phone], to confirm your monitor is transmitting and record your baseline. They will answer any questions you may have regarding the monitor instructions at that time.  Returning the monitor to [place] all equipment back into blue box. Peel off strip of paper to expose adhesive and close box securely. There is a prepaid UPS shipping label on this box. Drop in a UPS drop box, or at a UPS facility like [employer]. You may also contact [employer] to arrange UPS to pick up monitor package at your home.

## 2021-11-01 NOTE — Telephone Encounter (Signed)
Pt was seen in the office today by Dr. Mayford Knife who ordered an Itamar Sleep study. I have set up the pt with the sleep study and have gone over all instructions and the tutorial with the pt and he husband. Both are aware once sleep study is approved, we like for the study to be done within 48 hours if possible. Both aware if study not done in 30 days and they have not called the office, they will be billed $100. Both aware if study denied and box is returned opened, they will be billed $100 as per agreement signed today. Both aware I will with PIN once approved.

## 2021-11-01 NOTE — Progress Notes (Signed)
Chief Complaint:   OBESITY Maureen Elliott is here to discuss her progress with her obesity treatment plan along with follow-up of her obesity related diagnoses. Maureen Elliott is on the Category 2 Plan and states she is following her eating plan approximately 0% of the time. Maureen Elliott states she is doing 0 minutes 0 times per week.  Today's visit was #: 21 Starting weight: 171 lbs Starting date: 04/28/2020 Today's weight: 144 lbs Today's date:10/31/2021 Total lbs lost to date: 27 lbs Total lbs lost since last in-office visit: 0  Interim History: Maureen Elliott is up 2 lbs since her last visit. She was diagnosed with A-Fib and is scheduled tomorrow for cardiologist. She has a prescription of Xarelto.  Subjective:   1. Hypercholesterolemia Maureen Elliott is not on medications currently.  2. Insulin resistance Maureen Elliott is currently not on medications.   3. Paroxysmal atrial fibrillation (HCC) Maureen Elliott is taking Xarelto currently.  Assessment/Plan:   1. Hypercholesterolemia Cardiovascular risk and specific lipid/LDL goals reviewed.  Aris will continue to work on plan and increase activities. We discussed several lifestyle modifications today and Tandi will continue to work on diet, exercise and weight loss efforts. Orders and follow up as documented in patient record.   Counseling Intensive lifestyle modifications are the first line treatment for this issue. Dietary changes: Increase soluble fiber. Decrease simple carbohydrates. Exercise changes: Moderate to vigorous-intensity aerobic activity 150 minutes per week if tolerated. Lipid-lowering medications: see documented in medical record.  2. Insulin resistance Maureen Elliott will continue to work on plan and keep all carbohydrates low. Maureen Elliott agreed to follow-up with [place] as directed to closely monitor her progress.  3. Paroxysmal atrial fibrillation (HCC) Maureen Elliott will follow up with the cardiologist.  4. Obesity, current BMI 25.6 Maureen Elliott is currently in the action stage of change.  As such, her goal is to continue with weight loss efforts. She has agreed to practicing portion control and making smarter food choices, such as increasing vegetables and decreasing simple carbohydrates.   Maureen Elliott will adhere to the eating plan or calories and protein goals 80-90% of the time or [place] working on track.  Exercise goals: No exercise has been prescribed at this time.  Behavioral modification strategies: increasing lean protein intake, decreasing simple carbohydrates, increasing vegetables, increasing water intake, decreasing eating out, no skipping meals, meal planning and cooking strategies, keeping healthy foods in the home, and planning for success.  Maureen Elliott has agreed to follow-up with our clinic in 4 weeks. She was informed of the importance of frequent follow-up visits to maximize her success with intensive lifestyle modifications for her multiple health conditions.   Objective:   Blood pressure 139/62, pulse 65, temperature 98 F (36.7 C), height 5\' 3"  (1.6 m), weight 144 lb (65.3 kg), SpO2 99 %. Body mass index is 25.51 kg/m.  General: Cooperative, alert, well developed, in no acute distress. HEENT: Conjunctivae and lids unremarkable. Cardiovascular: Regular rhythm.  Lungs: Normal work of breathing. Neurologic: No focal deficits.   Lab Results  Component Value Date   CREATININE 0.72 10/05/2021   BUN 17 10/05/2021   NA 139 10/05/2021   K 3.5 10/05/2021   CL 104 10/05/2021   CO2 25 10/05/2021   Lab Results  Component Value Date   ALT 16 10/05/2021   AST 19 10/05/2021   ALKPHOS 54 10/05/2021   BILITOT 0.7 10/05/2021   Lab Results  Component Value Date   HGBA1C 5.3 10/04/2021   HGBA1C 5.7 (H) 03/31/2021   HGBA1C 5.5 11/09/2020   HGBA1C  5.8 (H) 08/05/2020   HGBA1C 5.7 (H) 04/28/2020   Lab Results  Component Value Date   INSULIN 2.8 11/09/2020   INSULIN 5.1 08/05/2020   INSULIN 7.6 04/28/2020   Lab Results  Component Value Date   TSH 1.560 04/28/2020    Lab Results  Component Value Date   CHOL 153 11/09/2020   HDL 54 11/09/2020   LDLCALC 88 11/09/2020   TRIG 50 11/09/2020   Lab Results  Component Value Date   VD25OH 51.2 03/31/2021   VD25OH 76.1 11/09/2020   VD25OH 65.0 08/05/2020   Lab Results  Component Value Date   WBC 8.0 10/05/2021   HGB 12.7 10/05/2021   HCT 38.6 10/05/2021   MCV 93.5 10/05/2021   PLT 228 10/05/2021   No results found for: IRON, TIBC, FERRITIN  Attestation Statements:   Reviewed by clinician on day of visit: allergies, medications, problem list, medical history, surgical history, family history, social history, and previous encounter notes.  I, Jackson Latino, RMA, am acting as Energy manager for [employer], DO.  I have reviewed the above documentation for accuracy and completeness, and I agree with the above. Corinna Capra, DO

## 2021-11-01 NOTE — Progress Notes (Signed)
Patient ID: Maureen Elliott, female   DOB: 1946/04/24, 76 y.o.   MRN: [id number] Patient enrolled for [employer] to ship a 30 day cardiac event monitor to her address on file.

## 2021-11-02 ENCOUNTER — Encounter (INDEPENDENT_AMBULATORY_CARE_PROVIDER_SITE_OTHER): Payer: Self-pay | Admitting: Bariatrics

## 2021-11-02 ENCOUNTER — Telehealth: Payer: Self-pay | Admitting: Cardiology

## 2021-11-02 LAB — BASIC METABOLIC PANEL
BUN/Creatinine Ratio: 28 (ref 12–28)
BUN: 22 mg/dL (ref 8–27)
CO2: 23 mmol/L (ref 20–29)
Calcium: 9.6 mg/dL (ref 8.7–10.3)
Chloride: 104 mmol/L (ref 96–106)
Creatinine, Ser: 0.79 mg/dL (ref 0.57–1.00)
Glucose: 90 mg/dL (ref 70–99)
Potassium: 4 mmol/L (ref 3.5–5.2)
Sodium: 143 mmol/L (ref 134–144)
eGFR: 78 mL/min/{1.73_m2} (ref >59)

## 2021-11-02 MED ORDER — METOPROLOL TARTRATE 100 MG PO TABS: ORAL_TABLET | ORAL | 0 refills | Status: DC

## 2021-11-02 NOTE — Telephone Encounter (Signed)
°*  STAT* If patient is at the pharmacy, call can be transferred to refill team.   1. Which medications need to be refilled? (please list name of each medication and dose if known) metoprolol tartrate (LOPRESSOR) 100 MG tablet    2. Which pharmacy/location (including street and city if local pharmacy) is medication to be sent to? [id number] - [place], [place] - [address] AT [address]  3. Do they need a 30 day or 90 day supply? 1 tablet

## 2021-11-02 NOTE — Telephone Encounter (Signed)
Pt's medication was resent to pt's pharmacy as requested, because the first Rx had a end date that made the prescription expire. Confirmation received.

## 2021-11-03 ENCOUNTER — Encounter (INDEPENDENT_AMBULATORY_CARE_PROVIDER_SITE_OTHER): Payer: Medicare PPO | Admitting: Cardiology

## 2021-11-03 DIAGNOSIS — I480 Paroxysmal atrial fibrillation: Secondary | ICD-10-CM

## 2021-11-03 NOTE — Telephone Encounter (Signed)
Called and made the patient aware that she may proceed with the [facility] Sleep Study. PIN # [phone] provided to the patient. Patient made aware that she will be contacted after the test has been read with the results and any recommendations. Patient verbalized understanding and thanked me for the call.   Pt will do sleep study in the few nights

## 2021-11-03 NOTE — Telephone Encounter (Signed)
ok to schedule NO PA required

## 2021-11-07 ENCOUNTER — Ambulatory Visit: Payer: Medicare PPO

## 2021-11-07 DIAGNOSIS — I480 Paroxysmal atrial fibrillation: Secondary | ICD-10-CM

## 2021-11-07 DIAGNOSIS — H401113 Primary open-angle glaucoma, right eye, severe stage: Secondary | ICD-10-CM | POA: Diagnosis not present

## 2021-11-07 DIAGNOSIS — R079 Chest pain, unspecified: Secondary | ICD-10-CM

## 2021-11-07 DIAGNOSIS — H401121 Primary open-angle glaucoma, left eye, mild stage: Secondary | ICD-10-CM | POA: Diagnosis not present

## 2021-11-07 NOTE — Procedures (Signed)
° °  Sleep Study Report  Patient Information Study Date: 11/03/21 Patient Name: Maureen Elliott Patient ID: [id number] Birth Date: [birth year] Age: 76 Gender: Female BMI: 26.6 (W=150 lb, H=5' 3'') Neck Circ.: 15 '' Referring Physician: Armanda Magic, MD  TEST DESCRIPTION: Home sleep apnea testing was completed using the WatchPat, a Type 1 device, utilizing peripheral arterial tonometry (PAT), chest movement, actigraphy, pulse oximetry, pulse rate, body position and snore. AHI was calculated with apnea and hypopnea using valid sleep time as the denominator. RDI includes apneas, hypopneas, and RERAs. The data acquired and the scoring of sleep and all associated events were performed in accordance with the recommended standards and specifications as outlined in the AASM Manual for the Scoring of Sleep and Associated Events 2.2.0 (2015).  FINDINGS: 1. No evidence of Obstructive Sleep Apnea with AHI 3.9/hr. 2. No Central Sleep Apnea. 3. Oxygen desaturations as low as 87%. 4. Minimal snoring was present. O2 sats were < 88% for 0.5 minutes. 5. Total sleep time was 8 hrs and 29 min. 6. 20.2% of total sleep time was spent in REM sleep. 7. Shortened sleep onset latency at 6 min. 8. Prolonged REM sleep onset latency at 104 min. 9. Total awakenings were 5.  DIAGNOSIS: Normal study with no significant sleep disordered breathing.  RECOMMENDATIONS: 1. Normal study with no significant sleep disordered breathing.  2. Healthy sleep recommendations include: adequate nightly sleep (normal 7-9 hrs/night), avoidance of caffeine after noon and alcohol near bedtime, and maintaining a sleep environment that is cool, dark and quiet.  3. Weight loss for overweight patients is recommended.  4. Snoring recommendations include: weight loss where appropriate, side sleeping, and avoidance of alcohol before bed.  5. Operation of motor vehicle or dangerous equipment must be avoided when feeling drowsy,  excessively sleepy, or mentally fatigued.  6. An ENT consultation which may be useful for specific causes of and possible treatment of bothersome snoring .  7. Weight loss may be of benefit in reducing the severity of snoring.   Signature: Electronically Signed: 11/07/21 Armanda Magic, MD; [facility]; Diplomat, American Board of Sleep Medicine

## 2021-11-11 ENCOUNTER — Telehealth: Payer: Self-pay | Admitting: *Deleted

## 2021-11-11 NOTE — Telephone Encounter (Signed)
The patient has been notified of the result and verbalized understanding.  All questions (if any) were answered. Maureen Elliott, CMA 11/11/2021 11:34 AM   Patient says she fell out of the bed 5 am and if any thing looks abnormal that is why.  Pt is aware and agreeable to normal results.

## 2021-11-11 NOTE — Telephone Encounter (Signed)
-----   Message from Gaynelle Cage, [place] sent at 11/08/2021  3:46 PM EST -----  ----- Message ----- From: Quintella Reichert, MD Sent: 11/07/2021   8:40 AM EST To: [facility] Sleep Studies  Normal home sleep study so in lab PSG will be ordered

## 2021-11-14 ENCOUNTER — Telehealth (HOSPITAL_COMMUNITY): Payer: Self-pay | Admitting: *Deleted

## 2021-11-14 NOTE — Telephone Encounter (Signed)
Reaching out to patient to offer assistance regarding upcoming cardiac imaging study; pt verbalizes understanding of appt date/time, parking situation and where to check in, pre-test NPO status and medications ordered, and verified current allergies; name and call back number provided for further questions should they arise ? ?Kaiulani Sitton RN Navigator Cardiac Imaging ? Heart and Vascular ?[phone] office ?[phone] cell ? ?Patient to take 50mg metoprolol tartrate two hours prior to her cardiac CT scan.  She is aware to arrive at 8:30am for her 9am scan. ?

## 2021-11-15 ENCOUNTER — Other Ambulatory Visit: Payer: Self-pay

## 2021-11-15 ENCOUNTER — Ambulatory Visit (INDEPENDENT_AMBULATORY_CARE_PROVIDER_SITE_OTHER): Payer: Medicare PPO

## 2021-11-15 ENCOUNTER — Encounter: Payer: Self-pay | Admitting: Cardiology

## 2021-11-15 ENCOUNTER — Ambulatory Visit (HOSPITAL_COMMUNITY)
Admission: RE | Admit: 2021-11-15 | Discharge: 2021-11-15 | Disposition: A | Discharge status: Home / Self Care | Payer: Medicare PPO | Source: Ambulatory Visit | Attending: Cardiology | Admitting: Cardiology

## 2021-11-15 DIAGNOSIS — I480 Paroxysmal atrial fibrillation: Secondary | ICD-10-CM | POA: Insufficient documentation

## 2021-11-15 DIAGNOSIS — R079 Chest pain, unspecified: Secondary | ICD-10-CM

## 2021-11-15 MED ORDER — NITROGLYCERIN 0.4 MG SL SUBL
SUBLINGUAL_TABLET | SUBLINGUAL | Status: AC
  Filled 2021-11-15: qty 2

## 2021-11-15 MED ORDER — NITROGLYCERIN 0.4 MG SL SUBL
0.8000 mg | SUBLINGUAL_TABLET | Freq: Once | SUBLINGUAL | Status: AC
  Administered 2021-11-15: 0.8 mg via SUBLINGUAL

## 2021-11-15 MED ORDER — IOHEXOL 350 MG/ML SOLN
100.0000 mL | Freq: Once | INTRAVENOUS | Status: AC | PRN
  Administered 2021-11-15: 100 mL via INTRAVENOUS

## 2021-11-24 ENCOUNTER — Encounter (INDEPENDENT_AMBULATORY_CARE_PROVIDER_SITE_OTHER): Payer: Self-pay | Admitting: Bariatrics

## 2021-11-24 ENCOUNTER — Other Ambulatory Visit: Payer: Self-pay

## 2021-11-24 ENCOUNTER — Ambulatory Visit (INDEPENDENT_AMBULATORY_CARE_PROVIDER_SITE_OTHER): Payer: Medicare PPO | Admitting: Bariatrics

## 2021-11-24 VITALS — BP 110/68 | HR 68 | Temp 98.0°F | Ht 63.0 in | Wt 141.0 lb

## 2021-11-24 DIAGNOSIS — E8881 Metabolic syndrome: Secondary | ICD-10-CM | POA: Diagnosis not present

## 2021-11-24 DIAGNOSIS — Z6825 Body mass index (BMI) 25.0-25.9, adult: Secondary | ICD-10-CM | POA: Diagnosis not present

## 2021-11-24 DIAGNOSIS — E7849 Other hyperlipidemia: Secondary | ICD-10-CM

## 2021-11-24 DIAGNOSIS — E669 Obesity, unspecified: Secondary | ICD-10-CM | POA: Diagnosis not present

## 2021-11-24 DIAGNOSIS — Z6830 Body mass index (BMI) 30.0-30.9, adult: Secondary | ICD-10-CM

## 2021-11-24 NOTE — Progress Notes (Signed)
? ? ? ?Chief Complaint:  ? ?OBESITY ?Maureen Elliott is here to discuss her progress with her obesity treatment plan along with follow-up of her obesity related diagnoses. Maureen Elliott is on the Category 2 Plan and states she is following her eating plan approximately 85% of the time. Maureen Elliott states she is walking for 30 minutes 5 times per week. ? ?Today's visit was #: 22 ?Starting weight: 171 lbs ?Starting date: 04/28/2020 ?Today's weight: 141 lbs ?Today's date: 11/24/2021 ?Total lbs lost to date: 30 lbs ?Total lbs lost since last in-office visit: 3 lbs ? ?Interim History: Maureen Elliott is down an additional 3 lbs. She recently had a sleep study and is wearing her cardiac monitor.  ? ?Subjective:  ? ?1. Other hyperlipidemia ?Maureen Elliott will continue to follow the plan. CT scan: minimal nonobstructive CAD.  ? ?2. Insulin resistance ?Maureen Elliott is not on medications currently.  ? ?Assessment/Plan:  ? ?1. Other hyperlipidemia ?Cardiovascular risk and specific lipid/LDL goals reviewed.  Maureen Elliott will have zero trans fats. She will increase MUFA's and PUFA's. We discussed several lifestyle modifications today and Maureen Elliott will continue to work on diet, exercise and weight loss efforts. Orders and follow up as documented in patient record.  ? ?Counseling ?Intensive lifestyle modifications are the first line treatment for this issue. ?Dietary changes: Increase soluble fiber. Decrease simple carbohydrates. ?Exercise changes: Moderate to vigorous-intensity aerobic activity 150 minutes per week if tolerated. ?Lipid-lowering medications: see documented in medical record. ? ?2. Insulin resistance ?Maureen Elliott will continue to follow the plan closely. She will minimize starches and sweets. She will continue to work on weight loss, exercise, and decreasing simple carbohydrates to help decrease the risk of diabetes. Maureen Elliott agreed to follow-up with [place] as directed to closely monitor her progress. ? ?3. Obesity, current BMI 25.1 ?Maureen Elliott is currently in the action stage of change. As  such, her goal is to continue with weight loss efforts. She has agreed to the Category 2 Plan.  ? ?Maureen Elliott will continue meal planning and she will continue intentional eating. She will keep her weight stable.  ? ?Exercise goals:  As is.  ? ?Behavioral modification strategies: increasing lean protein intake, decreasing simple carbohydrates, increasing vegetables, increasing water intake, decreasing eating out, no skipping meals, meal planning and cooking strategies, keeping healthy foods in the home, and planning for success. ? ?Maureen Elliott has agreed to follow-up with our clinic in 3-4 weeks. She was informed of the importance of frequent follow-up visits to maximize her success with intensive lifestyle modifications for her multiple health conditions.  ? ?Objective:  ? ?Blood pressure 110/68, pulse 68, temperature 98 ?F (36.7 ?C), height 5\' 3"  (1.6 m), weight 141 lb (64 kg), SpO2 97 %. ?Body mass index is 24.98 kg/m?. ? ?General: Cooperative, alert, well developed, in no acute distress. ?HEENT: Conjunctivae and lids unremarkable. ?Cardiovascular: Regular rhythm.  ?Lungs: Normal work of breathing. ?Neurologic: No focal deficits.  ? ?Lab Results  ?Component Value Date  ? CREATININE 0.79 11/01/2021  ? BUN 22 11/01/2021  ? NA 143 11/01/2021  ? K 4.0 11/01/2021  ? CL 104 11/01/2021  ? CO2 23 11/01/2021  ? ?Lab Results  ?Component Value Date  ? ALT 16 10/05/2021  ? AST 19 10/05/2021  ? ALKPHOS 54 10/05/2021  ? BILITOT 0.7 10/05/2021  ? ?Lab Results  ?Component Value Date  ? HGBA1C 5.3 10/04/2021  ? HGBA1C 5.7 (H) 03/31/2021  ? HGBA1C 5.5 11/09/2020  ? HGBA1C 5.8 (H) 08/05/2020  ? HGBA1C 5.7 (H) 04/28/2020  ? ?Lab Results  ?  Component Value Date  ? INSULIN 2.8 11/09/2020  ? INSULIN 5.1 08/05/2020  ? INSULIN 7.6 04/28/2020  ? ?Lab Results  ?Component Value Date  ? TSH 1.560 04/28/2020  ? ?Lab Results  ?Component Value Date  ? CHOL 153 11/09/2020  ? HDL 54 11/09/2020  ? LDLCALC 88 11/09/2020  ? TRIG 50 11/09/2020  ? ?Lab Results   ?Component Value Date  ? VD25OH 51.2 03/31/2021  ? VD25OH 76.1 11/09/2020  ? VD25OH 65.0 08/05/2020  ? ?Lab Results  ?Component Value Date  ? WBC 8.0 10/05/2021  ? HGB 12.7 10/05/2021  ? HCT 38.6 10/05/2021  ? MCV 93.5 10/05/2021  ? PLT 228 10/05/2021  ? ?No results found for: IRON, TIBC, FERRITIN ? ?Attestation Statements:  ? ?Reviewed by clinician on day of visit: allergies, medications, problem list, medical history, surgical history, family history, social history, and previous encounter notes. ? ?I, Jackson Latino, RMA, am acting as transcriptionist for [employer], DO. ? ?I have reviewed the above documentation for accuracy and completeness, and I agree with the above. Corinna Capra, DO ? ? ?

## 2021-11-27 DIAGNOSIS — I700 Atherosclerosis of aorta: Secondary | ICD-10-CM | POA: Insufficient documentation

## 2021-11-27 DIAGNOSIS — I2510 Atherosclerotic heart disease of native coronary artery without angina pectoris: Secondary | ICD-10-CM | POA: Insufficient documentation

## 2021-11-27 DIAGNOSIS — Z86718 Personal history of other venous thrombosis and embolism: Secondary | ICD-10-CM | POA: Insufficient documentation

## 2021-11-27 DIAGNOSIS — E7800 Pure hypercholesterolemia, unspecified: Secondary | ICD-10-CM | POA: Insufficient documentation

## 2021-11-27 DIAGNOSIS — M8580 Other specified disorders of bone density and structure, unspecified site: Secondary | ICD-10-CM | POA: Insufficient documentation

## 2021-11-27 DIAGNOSIS — E042 Nontoxic multinodular goiter: Secondary | ICD-10-CM | POA: Insufficient documentation

## 2021-11-27 DIAGNOSIS — N390 Urinary tract infection, site not specified: Secondary | ICD-10-CM | POA: Insufficient documentation

## 2021-11-27 DIAGNOSIS — H401130 Primary open-angle glaucoma, bilateral, stage unspecified: Secondary | ICD-10-CM | POA: Insufficient documentation

## 2021-11-28 ENCOUNTER — Telehealth: Payer: Self-pay | Admitting: *Deleted

## 2021-11-28 NOTE — Telephone Encounter (Signed)
Patient is having an allergic reaction to monitor. Says that it burns and stings and itches when she put it no. Changed position of monitor but did not help. Nothing is helping. Please call back to discuss ?

## 2021-11-29 ENCOUNTER — Telehealth: Payer: Self-pay | Admitting: *Deleted

## 2021-11-29 NOTE — Telephone Encounter (Signed)
Patient experiencing severe irritation from [employer] Acrylic strips for event monitor. ?Instructed patient to remove monitor to allow skin to heal.  [employer] has been contacted to ship patient Hydrocolloid strips.  Patient may re-apply monitor with Hydrocolloid strips once her skin has healed.  Explained to patient this would just be an attempt to obtain as much data as possible during her 30 day enrollment period.  If she has continued reaction using Hydrocollid strips remove monitor. ?

## 2021-11-29 NOTE — Telephone Encounter (Signed)
See telephone note 11/29/21

## 2021-12-09 ENCOUNTER — Telehealth: Payer: Self-pay | Admitting: Cardiology

## 2021-12-09 NOTE — Telephone Encounter (Signed)
Pt called and stated that she has only was able to leave the monitor for 4 hrs.  She had another allergic reaction and is not able to wear the monitor.  She wanted let shelly know she left some things for her at the front desk yesterday and wanted to make sure she get them.  ?She mailed it back to the company  ? ?Best number [phone] ?

## 2021-12-19 DIAGNOSIS — E663 Overweight: Secondary | ICD-10-CM | POA: Diagnosis not present

## 2021-12-19 DIAGNOSIS — Z0000 Encounter for general adult medical examination without abnormal findings: Secondary | ICD-10-CM | POA: Diagnosis not present

## 2021-12-19 DIAGNOSIS — I480 Paroxysmal atrial fibrillation: Secondary | ICD-10-CM | POA: Diagnosis not present

## 2021-12-19 DIAGNOSIS — I700 Atherosclerosis of aorta: Secondary | ICD-10-CM | POA: Diagnosis not present

## 2021-12-19 DIAGNOSIS — M8580 Other specified disorders of bone density and structure, unspecified site: Secondary | ICD-10-CM | POA: Diagnosis not present

## 2021-12-26 ENCOUNTER — Encounter (INDEPENDENT_AMBULATORY_CARE_PROVIDER_SITE_OTHER): Payer: Self-pay | Admitting: Bariatrics

## 2021-12-26 ENCOUNTER — Ambulatory Visit (INDEPENDENT_AMBULATORY_CARE_PROVIDER_SITE_OTHER): Payer: Medicare PPO | Admitting: Bariatrics

## 2021-12-26 VITALS — BP 127/77 | HR 69 | Temp 98.0°F | Ht 63.0 in | Wt 143.0 lb

## 2021-12-26 DIAGNOSIS — E669 Obesity, unspecified: Secondary | ICD-10-CM | POA: Diagnosis not present

## 2021-12-26 DIAGNOSIS — E7800 Pure hypercholesterolemia, unspecified: Secondary | ICD-10-CM

## 2021-12-26 DIAGNOSIS — E8881 Metabolic syndrome: Secondary | ICD-10-CM | POA: Diagnosis not present

## 2021-12-26 DIAGNOSIS — Z6825 Body mass index (BMI) 25.0-25.9, adult: Secondary | ICD-10-CM | POA: Diagnosis not present

## 2021-12-26 NOTE — Progress Notes (Signed)
? ? ? ?Chief Complaint:  ? ?OBESITY ?Maureen Elliott is here to discuss her progress with her obesity treatment plan along with follow-up of her obesity related diagnoses. Maureen Elliott is on the Category 2 Plan and states she is following her eating plan approximately 50% of the time. Maureen Elliott states she is doing 0 minutes 0 times per week. ? ?Today's visit was #: 23 ?Starting weight: 171 lbs ?Starting date: 04/28/2020 ?Today's weight: 143 lbs ?Today's date: 12/26/2021 ?Total lbs lost to date: 28 lbs ?Total lbs lost since last in-office visit: 0 ? ?Interim History: Maureen Elliott is up 2 lbs since her last visit. She had a tooth pulled and not able to eat all of her food.  ? ?Subjective:  ? ?1. Hypercholesterolemia ?Cholesterol is off plan.  ? ?2. Insulin resistance ?Maureen Elliott is not on medications currently.  ? ?Assessment/Plan:  ? ?1. Hypercholesterolemia ?Cardiovascular risk and specific lipid/LDL goals reviewed.  Maureen Elliott will continue medications. She will have zero trans fats and limited saturated fats except dairy and dark chocolate. We will check lipid panel today. We discussed several lifestyle modifications today and Maureen Elliott will continue to work on diet, exercise and weight loss efforts. Orders and follow up as documented in patient record.  ? ?Counseling ?Intensive lifestyle modifications are the first line treatment for this issue. ?Dietary changes: Increase soluble fiber. Decrease simple carbohydrates. ?Exercise changes: Moderate to vigorous-intensity aerobic activity 150 minutes per week if tolerated. ?Lipid-lowering medications: see documented in medical record. ? ?- Lipid Panel With LDL/HDL Ratio ? ?2. Insulin resistance ?Maureen Elliott will minimize all carbohydrates (sweets and starches). She will continue to work on weight loss, exercise, and decreasing simple carbohydrates to help decrease the risk of diabetes. Maureen Elliott agreed to follow-up with [place] as directed to closely monitor her progress. ? ?3. Obesity, current BMI 25.4 ?Maureen Elliott is currently in  the action stage of change. As such, her goal is to continue with weight loss efforts. She has agreed to the Category 2 Plan.  ? ?Maureen Elliott will continue meal planning and she will continue intentional eating. She will resume the plan.  ? ?Exercise goals: No exercise has been prescribed at this time. ? ?Behavioral modification strategies: increasing lean protein intake, decreasing simple carbohydrates, increasing vegetables, increasing water intake, decreasing eating out, no skipping meals, meal planning and cooking strategies, keeping healthy foods in the home, and planning for success. ? ?Maureen Elliott has agreed to follow-up with our clinic in 4-5 weeks. She was informed of the importance of frequent follow-up visits to maximize her success with intensive lifestyle modifications for her multiple health conditions.  ? ?Maureen Elliott was informed we would discuss her lab results at her next visit unless there is a critical issue that needs to be addressed sooner. Delayni agreed to keep her next visit at the agreed upon time to discuss these results. ? ?Objective:  ? ?Blood pressure 127/77, pulse 69, temperature 98 ?F (36.7 ?C), height 5\' 3"  (1.6 m), weight 143 lb (64.9 kg), SpO2 97 %. ?Body mass index is 25.33 kg/m?. ? ?General: Cooperative, alert, well developed, in no acute distress. ?HEENT: Conjunctivae and lids unremarkable. ?Cardiovascular: Regular rhythm.  ?Lungs: Normal work of breathing. ?Neurologic: No focal deficits.  ? ?Lab Results  ?Component Value Date  ? CREATININE 0.79 11/01/2021  ? BUN 22 11/01/2021  ? NA 143 11/01/2021  ? K 4.0 11/01/2021  ? CL 104 11/01/2021  ? CO2 23 11/01/2021  ? ?Lab Results  ?Component Value Date  ? ALT 16 10/05/2021  ? AST 19  10/05/2021  ? ALKPHOS 54 10/05/2021  ? BILITOT 0.7 10/05/2021  ? ?Lab Results  ?Component Value Date  ? HGBA1C 5.3 10/04/2021  ? HGBA1C 5.7 (H) 03/31/2021  ? HGBA1C 5.5 11/09/2020  ? HGBA1C 5.8 (H) 08/05/2020  ? HGBA1C 5.7 (H) 04/28/2020  ? ?Lab Results  ?Component Value Date   ? INSULIN 2.8 11/09/2020  ? INSULIN 5.1 08/05/2020  ? INSULIN 7.6 04/28/2020  ? ?Lab Results  ?Component Value Date  ? TSH 1.560 04/28/2020  ? ?Lab Results  ?Component Value Date  ? CHOL 153 11/09/2020  ? HDL 54 11/09/2020  ? [place] 88 11/09/2020  ? TRIG 50 11/09/2020  ? ?Lab Results  ?Component Value Date  ? VD25OH 51.2 03/31/2021  ? VD25OH 76.1 11/09/2020  ? VD25OH 65.0 08/05/2020  ? ?Lab Results  ?Component Value Date  ? WBC 8.0 10/05/2021  ? HGB 12.7 10/05/2021  ? HCT 38.6 10/05/2021  ? MCV 93.5 10/05/2021  ? PLT 228 10/05/2021  ? ?No results found for: IRON, TIBC, FERRITIN ? ?Attestation Statements:  ? ?Reviewed by clinician on day of visit: allergies, medications, problem list, medical history, surgical history, family history, social history, and previous encounter notes. ? ?I, Lizbeth Bark, RMA, am acting as transcriptionist for [employer], DO. ? ?I have reviewed the above documentation for accuracy and completeness, and I agree with the above. Jearld Lesch, DO ? ?

## 2021-12-27 ENCOUNTER — Encounter (INDEPENDENT_AMBULATORY_CARE_PROVIDER_SITE_OTHER): Payer: Self-pay | Admitting: Bariatrics

## 2021-12-27 LAB — LIPID PANEL WITH LDL/HDL RATIO
Cholesterol, Total: 239 mg/dL — ABNORMAL HIGH (ref 100–199)
HDL: 68 mg/dL (ref >39)
LDL Chol Calc (NIH): 157 mg/dL — ABNORMAL HIGH (ref 0–99)
LDL/HDL Ratio: 2.3 ratio (ref 0.0–3.2)
Triglycerides: 81 mg/dL (ref 0–149)
VLDL Cholesterol Cal: 14 mg/dL (ref 5–40)

## 2022-01-30 DIAGNOSIS — I2510 Atherosclerotic heart disease of native coronary artery without angina pectoris: Secondary | ICD-10-CM | POA: Diagnosis not present

## 2022-01-30 DIAGNOSIS — R935 Abnormal findings on diagnostic imaging of other abdominal regions, including retroperitoneum: Secondary | ICD-10-CM | POA: Diagnosis not present

## 2022-01-30 DIAGNOSIS — E7800 Pure hypercholesterolemia, unspecified: Secondary | ICD-10-CM | POA: Diagnosis not present

## 2022-01-30 DIAGNOSIS — R9389 Abnormal findings on diagnostic imaging of other specified body structures: Secondary | ICD-10-CM | POA: Diagnosis not present

## 2022-01-30 DIAGNOSIS — I700 Atherosclerosis of aorta: Secondary | ICD-10-CM | POA: Diagnosis not present

## 2022-01-30 DIAGNOSIS — Z6826 Body mass index (BMI) 26.0-26.9, adult: Secondary | ICD-10-CM | POA: Diagnosis not present

## 2022-01-31 ENCOUNTER — Ambulatory Visit (INDEPENDENT_AMBULATORY_CARE_PROVIDER_SITE_OTHER): Payer: Medicare PPO | Admitting: Bariatrics

## 2022-01-31 ENCOUNTER — Encounter (INDEPENDENT_AMBULATORY_CARE_PROVIDER_SITE_OTHER): Payer: Self-pay | Admitting: Bariatrics

## 2022-01-31 ENCOUNTER — Other Ambulatory Visit: Payer: Self-pay | Admitting: Family Medicine

## 2022-01-31 VITALS — BP 115/68 | HR 65 | Temp 98.0°F | Ht 63.0 in | Wt 142.0 lb

## 2022-01-31 DIAGNOSIS — E669 Obesity, unspecified: Secondary | ICD-10-CM

## 2022-01-31 DIAGNOSIS — Z6825 Body mass index (BMI) 25.0-25.9, adult: Secondary | ICD-10-CM

## 2022-01-31 DIAGNOSIS — E7800 Pure hypercholesterolemia, unspecified: Secondary | ICD-10-CM

## 2022-01-31 DIAGNOSIS — E8881 Metabolic syndrome: Secondary | ICD-10-CM

## 2022-01-31 DIAGNOSIS — R935 Abnormal findings on diagnostic imaging of other abdominal regions, including retroperitoneum: Secondary | ICD-10-CM

## 2022-02-02 ENCOUNTER — Other Ambulatory Visit: Payer: Medicare PPO

## 2022-02-03 ENCOUNTER — Ambulatory Visit
Admission: RE | Admit: 2022-02-03 | Discharge: 2022-02-03 | Disposition: A | Discharge status: Home / Self Care | Payer: Medicare PPO | Source: Ambulatory Visit | Attending: Family Medicine | Admitting: Family Medicine

## 2022-02-03 DIAGNOSIS — Z90722 Acquired absence of ovaries, bilateral: Secondary | ICD-10-CM | POA: Diagnosis not present

## 2022-02-03 DIAGNOSIS — R935 Abnormal findings on diagnostic imaging of other abdominal regions, including retroperitoneum: Secondary | ICD-10-CM | POA: Diagnosis not present

## 2022-02-03 DIAGNOSIS — Z90710 Acquired absence of both cervix and uterus: Secondary | ICD-10-CM | POA: Diagnosis not present

## 2022-02-05 NOTE — Progress Notes (Signed)
Chief Complaint:   OBESITY Shanecia is here to discuss her progress with her obesity treatment plan along with follow-up of her obesity related diagnoses. Lesbia is on the Category 2 Plan and the Category 3 Plan and states she is following her eating plan approximately 95% of the time. Kylinn states she is [employer] exercise class for 50 minutes 5 times per week.  Today's visit was #: 24 Starting weight: 171 lbs Starting date: 04/28/2020 Today's weight: 142 lbs Today's date:01/31/2022 Total lbs lost to date: 29 lbs Total lbs lost since last in-office visit: 1 lb  Interim History: Evalene is down 1 lb since her last visit.   Subjective:   1. Hypercholesterolemia Lashanna is currently taking Cholestoff plus. She was put on atorvastatin (she will have her liver enzymes checked in 2 months). Her HDL was 68. Her LDL was 157. Total cholesterol was 239.  2. Insulin resistance Marlayna is not on medications currently.   Assessment/Plan:   1. Hypercholesterolemia Cardiovascular risk and specific lipid/LDL goals reviewed. Maridee will continue her medications. She will do fasting Lipid panel in August.  We discussed several lifestyle modifications today and Khaniya will continue to work on diet, exercise and weight loss efforts. Orders and follow up as documented in patient record.   Counseling Intensive lifestyle modifications are the first line treatment for this issue. Dietary changes: Increase soluble fiber. Decrease simple carbohydrates. Exercise changes: Moderate to vigorous-intensity aerobic activity 150 minutes per week if tolerated. Lipid-lowering medications: see documented in medical record.  2. Insulin resistance Atara will keep carbohydrates low (both sugar and starches). She will continue to work on weight loss, exercise, and decreasing simple carbohydrates to help decrease the risk of diabetes. Chaela agreed to follow-up with [place] as directed to closely monitor her progress.  3. Obesity, Current  BMI 25.2 Tranika is currently in the action stage of change. As such, her goal is to continue with weight loss efforts. She has agreed to alternate the Category 2 Plan and the Category 3 Plan.   Rosalea will continue meal planning and she will continue intentional eating.   Exercise goals:  As is.   Behavioral modification strategies: increasing lean protein intake, decreasing simple carbohydrates, increasing vegetables, increasing water intake, decreasing eating out, no skipping meals, meal planning and cooking strategies, keeping healthy foods in the home, and planning for success.  Jelina has agreed to follow-up with our clinic in 4-5 weeks. She was informed of the importance of frequent follow-up visits to maximize her success with intensive lifestyle modifications for her multiple health conditions.   Objective:   Blood pressure 115/68, pulse 65, temperature 98 F (36.7 C), height 5\' 3"  (1.6 m), weight 142 lb (64.4 kg), SpO2 97 %. Body mass index is 25.15 kg/m.  General: Cooperative, alert, well developed, in no acute distress. HEENT: Conjunctivae and lids unremarkable. Cardiovascular: Regular rhythm.  Lungs: Normal work of breathing. Neurologic: No focal deficits.   Lab Results  Component Value Date   CREATININE 0.79 11/01/2021   BUN 22 11/01/2021   NA 143 11/01/2021   K 4.0 11/01/2021   CL 104 11/01/2021   CO2 23 11/01/2021   Lab Results  Component Value Date   ALT 16 10/05/2021   AST 19 10/05/2021   ALKPHOS 54 10/05/2021   BILITOT 0.7 10/05/2021   Lab Results  Component Value Date   HGBA1C 5.3 10/04/2021   HGBA1C 5.7 (H) 03/31/2021   HGBA1C 5.5 11/09/2020   HGBA1C 5.8 (H) 08/05/2020  HGBA1C 5.7 (H) 04/28/2020   Lab Results  Component Value Date   INSULIN 2.8 11/09/2020   INSULIN 5.1 08/05/2020   INSULIN 7.6 04/28/2020   Lab Results  Component Value Date   TSH 1.560 04/28/2020   Lab Results  Component Value Date   CHOL 239 (H) 12/26/2021   HDL 68  12/26/2021   LDLCALC 157 (H) 12/26/2021   TRIG 81 12/26/2021   Lab Results  Component Value Date   VD25OH 51.2 03/31/2021   VD25OH 76.1 11/09/2020   VD25OH 65.0 08/05/2020   Lab Results  Component Value Date   WBC 8.0 10/05/2021   HGB 12.7 10/05/2021   HCT 38.6 10/05/2021   MCV 93.5 10/05/2021   PLT 228 10/05/2021   No results found for: IRON, TIBC, FERRITIN  Attestation Statements:   Reviewed by clinician on day of visit: allergies, medications, problem list, medical history, surgical history, family history, social history, and previous encounter notes.  I, Jackson Latino, RMA, am acting as Energy manager for [employer], DO.  I have reviewed the above documentation for accuracy and completeness, and I agree with the above. Corinna Capra, DO

## 2022-02-16 ENCOUNTER — Encounter (INDEPENDENT_AMBULATORY_CARE_PROVIDER_SITE_OTHER): Payer: Self-pay | Admitting: Bariatrics

## 2022-03-13 DIAGNOSIS — H401113 Primary open-angle glaucoma, right eye, severe stage: Secondary | ICD-10-CM | POA: Diagnosis not present

## 2022-03-13 DIAGNOSIS — H401121 Primary open-angle glaucoma, left eye, mild stage: Secondary | ICD-10-CM | POA: Diagnosis not present

## 2022-03-20 ENCOUNTER — Encounter (INDEPENDENT_AMBULATORY_CARE_PROVIDER_SITE_OTHER): Payer: Self-pay | Admitting: Bariatrics

## 2022-03-20 ENCOUNTER — Ambulatory Visit (INDEPENDENT_AMBULATORY_CARE_PROVIDER_SITE_OTHER): Payer: Medicare PPO | Admitting: Bariatrics

## 2022-03-20 VITALS — BP 103/67 | HR 76 | Temp 97.8°F | Ht 63.0 in | Wt 141.0 lb

## 2022-03-20 DIAGNOSIS — Z6825 Body mass index (BMI) 25.0-25.9, adult: Secondary | ICD-10-CM | POA: Diagnosis not present

## 2022-03-20 DIAGNOSIS — E669 Obesity, unspecified: Secondary | ICD-10-CM

## 2022-03-20 DIAGNOSIS — E8881 Metabolic syndrome: Secondary | ICD-10-CM | POA: Diagnosis not present

## 2022-03-20 DIAGNOSIS — E7800 Pure hypercholesterolemia, unspecified: Secondary | ICD-10-CM

## 2022-03-21 NOTE — Progress Notes (Signed)
Chief Complaint:   OBESITY Maureen Elliott is here to discuss her progress with her obesity treatment plan along with follow-up of her obesity related diagnoses. Maureen Elliott is on the Category 2 Plan or the Category 3 Plan and states she is following her eating plan approximately 85% of the time. Maureen Elliott states she is doing 0 minutes 0 times per week.  Today's visit was #: 25 Starting weight: 171 lbs Starting date: 04/28/2020 Today's weight: 141 lbs Today's date: 03/20/2022 Total lbs lost to date: 30 Total lbs lost since last in-office visit: 1  Interim History: Maureen Elliott is down 1 pound since her last visit. Her BMI is down at 25.1.  Subjective:   1. Pure hypercholesterolemia Maureen Elliott is taking Lipitor (lipophilic).  2. Insulin resistance Maureen Elliott is not on medications currently.  Assessment/Plan:   1. Pure hypercholesterolemia Maureen Elliott will continue Lipitor and CO-Q10 as directed.  2. Insulin resistance Maureen Elliott will keep her carbohydrates low (both sweets and starches).  3. Obesity, Current BMI 25.1 Maureen Elliott is currently in the action stage of change. As such, her goal is to continue with weight loss efforts. She has agreed to the Category 3 Plan, some of Category 2 plan.   We will recheck fasting labs at her next visit.  Exercise goals: Will start back in the gym and doing Silver sneaker.  Behavioral modification strategies: increasing lean protein intake, decreasing simple carbohydrates, increasing vegetables, increasing water intake, decreasing eating out, no skipping meals, meal planning and cooking strategies, keeping healthy foods in the home, and planning for success.  Maureen Elliott has agreed to follow-up with our clinic in 4 weeks. She was informed of the importance of frequent follow-up visits to maximize her success with intensive lifestyle modifications for her multiple health conditions.   Objective:   Blood pressure 103/67, pulse 76, temperature 97.8 F (36.6 C), height 5\' 3"  (1.6 m), weight  141 lb (64 kg), SpO2 97 %. Body mass index is 24.98 kg/m.  General: Cooperative, alert, well developed, in no acute distress. HEENT: Conjunctivae and lids unremarkable. Cardiovascular: Regular rhythm.  Lungs: Normal work of breathing. Neurologic: No focal deficits.   Lab Results  Component Value Date   CREATININE 0.79 11/01/2021   BUN 22 11/01/2021   NA 143 11/01/2021   K 4.0 11/01/2021   CL 104 11/01/2021   CO2 23 11/01/2021   Lab Results  Component Value Date   ALT 16 10/05/2021   AST 19 10/05/2021   ALKPHOS 54 10/05/2021   BILITOT 0.7 10/05/2021   Lab Results  Component Value Date   HGBA1C 5.3 10/04/2021   HGBA1C 5.7 (H) 03/31/2021   HGBA1C 5.5 11/09/2020   HGBA1C 5.8 (H) 08/05/2020   HGBA1C 5.7 (H) 04/28/2020   Lab Results  Component Value Date   INSULIN 2.8 11/09/2020   INSULIN 5.1 08/05/2020   INSULIN 7.6 04/28/2020   Lab Results  Component Value Date   TSH 1.560 04/28/2020   Lab Results  Component Value Date   CHOL 239 (H) 12/26/2021   HDL 68 12/26/2021   LDLCALC 157 (H) 12/26/2021   TRIG 81 12/26/2021   Lab Results  Component Value Date   VD25OH 51.2 03/31/2021   VD25OH 76.1 11/09/2020   VD25OH 65.0 08/05/2020   Lab Results  Component Value Date   WBC 8.0 10/05/2021   HGB 12.7 10/05/2021   HCT 38.6 10/05/2021   MCV 93.5 10/05/2021   PLT 228 10/05/2021   No results found for: "IRON", "TIBC", "FERRITIN"  Attestation  Statements:   Reviewed by clinician on day of visit: allergies, medications, problem list, medical history, surgical history, family history, social history, and previous encounter notes.   Wilhemena Durie, am acting as Location manager for [employer], DO.  I have reviewed the above documentation for accuracy and completeness, and I agree with the above. Jearld Lesch, DO

## 2022-03-22 ENCOUNTER — Encounter (INDEPENDENT_AMBULATORY_CARE_PROVIDER_SITE_OTHER): Payer: Self-pay | Admitting: Bariatrics

## 2022-03-22 DIAGNOSIS — L812 Freckles: Secondary | ICD-10-CM | POA: Diagnosis not present

## 2022-03-22 DIAGNOSIS — B078 Other viral warts: Secondary | ICD-10-CM | POA: Diagnosis not present

## 2022-03-22 DIAGNOSIS — L821 Other seborrheic keratosis: Secondary | ICD-10-CM | POA: Diagnosis not present

## 2022-03-22 DIAGNOSIS — D225 Melanocytic nevi of trunk: Secondary | ICD-10-CM | POA: Diagnosis not present

## 2022-03-22 DIAGNOSIS — D1801 Hemangioma of skin and subcutaneous tissue: Secondary | ICD-10-CM | POA: Diagnosis not present

## 2022-04-10 ENCOUNTER — Other Ambulatory Visit: Payer: Self-pay | Admitting: Family Medicine

## 2022-04-10 DIAGNOSIS — H401113 Primary open-angle glaucoma, right eye, severe stage: Secondary | ICD-10-CM | POA: Diagnosis not present

## 2022-04-10 DIAGNOSIS — H401121 Primary open-angle glaucoma, left eye, mild stage: Secondary | ICD-10-CM | POA: Diagnosis not present

## 2022-04-10 DIAGNOSIS — Z1231 Encounter for screening mammogram for malignant neoplasm of breast: Secondary | ICD-10-CM

## 2022-04-17 ENCOUNTER — Ambulatory Visit (INDEPENDENT_AMBULATORY_CARE_PROVIDER_SITE_OTHER): Payer: Medicare PPO | Admitting: Bariatrics

## 2022-04-17 ENCOUNTER — Encounter (INDEPENDENT_AMBULATORY_CARE_PROVIDER_SITE_OTHER): Payer: Self-pay | Admitting: Bariatrics

## 2022-04-17 VITALS — BP 110/72 | HR 69 | Temp 97.9°F | Ht 63.0 in | Wt 142.0 lb

## 2022-04-17 DIAGNOSIS — E669 Obesity, unspecified: Secondary | ICD-10-CM

## 2022-04-17 DIAGNOSIS — Z6825 Body mass index (BMI) 25.0-25.9, adult: Secondary | ICD-10-CM | POA: Diagnosis not present

## 2022-04-17 DIAGNOSIS — E559 Vitamin D deficiency, unspecified: Secondary | ICD-10-CM | POA: Diagnosis not present

## 2022-04-17 DIAGNOSIS — E7849 Other hyperlipidemia: Secondary | ICD-10-CM

## 2022-04-18 LAB — LIPID PANEL WITH LDL/HDL RATIO
Cholesterol, Total: 169 mg/dL (ref 100–199)
HDL: 63 mg/dL (ref >39)
LDL Chol Calc (NIH): 96 mg/dL (ref 0–99)
LDL/HDL Ratio: 1.5 ratio (ref 0.0–3.2)
Triglycerides: 51 mg/dL (ref 0–149)
VLDL Cholesterol Cal: 10 mg/dL (ref 5–40)

## 2022-04-18 LAB — COMPREHENSIVE METABOLIC PANEL
ALT: 17 IU/L (ref 0–32)
AST: 20 IU/L (ref 0–40)
Albumin/Globulin Ratio: 1.5 (ref 1.2–2.2)
Albumin: 4.3 g/dL (ref 3.8–4.8)
Alkaline Phosphatase: 68 IU/L (ref 44–121)
BUN/Creatinine Ratio: 23 (ref 12–28)
BUN: 18 mg/dL (ref 8–27)
Bilirubin Total: 0.4 mg/dL (ref 0.0–1.2)
CO2: 25 mmol/L (ref 20–29)
Calcium: 9.8 mg/dL (ref 8.7–10.3)
Chloride: 103 mmol/L (ref 96–106)
Creatinine, Ser: 0.78 mg/dL (ref 0.57–1.00)
Globulin, Total: 2.8 g/dL (ref 1.5–4.5)
Glucose: 92 mg/dL (ref 70–99)
Potassium: 4.1 mmol/L (ref 3.5–5.2)
Sodium: 142 mmol/L (ref 134–144)
Total Protein: 7.1 g/dL (ref 6.0–8.5)
eGFR: 79 mL/min/{1.73_m2} (ref >59)

## 2022-04-18 LAB — VITAMIN D 25 HYDROXY (VIT D DEFICIENCY, FRACTURES): Vit D, 25-Hydroxy: 49 ng/mL (ref 30.0–100.0)

## 2022-04-24 NOTE — Progress Notes (Unsigned)
Chief Complaint:   OBESITY Maureen Elliott is here to discuss her progress with her obesity treatment plan along with follow-up of her obesity related diagnoses. Maureen Elliott is on the Category 2 Plan and the Category 3 Plan and states she is following her eating plan approximately 0% of the time. Maureen Elliott states she is doing 0 minutes 0 times per week.  Today's visit was #: 26 Starting weight: 171 lbs Starting date: 04/28/2020 Today's weight: 142 lbs Today's date: 04/17/2022 Total lbs lost to date: 29 Total lbs lost since last in-office visit: 0  Interim History: Maureen Elliott is up 1 pound since her last visit.  Subjective:   1. Vitamin D deficiency Maureen Elliott is currently taking vitamin D.  2. Other hyperlipidemia Maureen Elliott is currently taking Lipitor.  Assessment/Plan:   1. Vitamin D deficiency We will check labs today.  Maureen Elliott will continue OTC vitamin D, and we will follow-up at her next office visit.  - VITAMIN D 25 Hydroxy (Vit-D Deficiency, Fractures)  2. Other hyperlipidemia We will check labs today.  Maureen Elliott will continue Lipitor, and we will follow-up at her next office visit.  - Comprehensive metabolic panel - Lipid Panel With LDL/HDL Ratio - VITAMIN D 25 Hydroxy (Vit-D Deficiency, Fractures)  3. Obesity, Current BMI 25.1 Kilah is currently in the action stage of change. As such, her goal is to continue with weight loss efforts. She has agreed to the Category 3 Plan.   Meal planning was discussed.  She will adhere more closely to the plan 80-90%.  Exercise goals: No exercise has been prescribed at this time.  Behavioral modification strategies: increasing lean protein intake, decreasing simple carbohydrates, increasing vegetables, increasing water intake, decreasing eating out, no skipping meals, meal planning and cooking strategies, keeping healthy foods in the home, and planning for success.  Maureen Elliott has agreed to follow-up with our clinic in 4 weeks. She was informed of the importance of  frequent follow-up visits to maximize her success with intensive lifestyle modifications for her multiple health conditions.   Maureen Elliott was informed we would discuss her lab results at her next visit unless there is a critical issue that needs to be addressed sooner. Maureen Elliott agreed to keep her next visit at the agreed upon time to discuss these results.  Objective:   Blood pressure 110/72, pulse 69, temperature 97.9 F (36.6 C), height 5\' 3"  (1.6 m), weight 142 lb (64.4 kg), SpO2 97 %. Body mass index is 25.15 kg/m.  General: Cooperative, alert, well developed, in no acute distress. HEENT: Conjunctivae and lids unremarkable. Cardiovascular: Regular rhythm.  Lungs: Normal work of breathing. Neurologic: No focal deficits.   Lab Results  Component Value Date   CREATININE 0.78 04/17/2022   BUN 18 04/17/2022   NA 142 04/17/2022   K 4.1 04/17/2022   CL 103 04/17/2022   CO2 25 04/17/2022   Lab Results  Component Value Date   ALT 17 04/17/2022   AST 20 04/17/2022   ALKPHOS 68 04/17/2022   BILITOT 0.4 04/17/2022   Lab Results  Component Value Date   HGBA1C 5.3 10/04/2021   HGBA1C 5.7 (H) 03/31/2021   HGBA1C 5.5 11/09/2020   HGBA1C 5.8 (H) 08/05/2020   HGBA1C 5.7 (H) 04/28/2020   Lab Results  Component Value Date   INSULIN 2.8 11/09/2020   INSULIN 5.1 08/05/2020   INSULIN 7.6 04/28/2020   Lab Results  Component Value Date   TSH 1.560 04/28/2020   Lab Results  Component Value Date   CHOL  169 04/17/2022   HDL 63 04/17/2022   LDLCALC 96 04/17/2022   TRIG 51 04/17/2022   Lab Results  Component Value Date   VD25OH 49.0 04/17/2022   VD25OH 51.2 03/31/2021   VD25OH 76.1 11/09/2020   Lab Results  Component Value Date   WBC 8.0 10/05/2021   HGB 12.7 10/05/2021   HCT 38.6 10/05/2021   MCV 93.5 10/05/2021   PLT 228 10/05/2021   No results found for: "IRON", "TIBC", "FERRITIN"  Attestation Statements:   Reviewed by clinician on day of visit: allergies, medications,  problem list, medical history, surgical history, family history, social history, and previous encounter notes.   Trude Mcburney, am acting as Energy manager for [employer], DO.  I have reviewed the above documentation for accuracy and completeness, and I agree with the above. Corinna Capra, DO

## 2022-04-25 ENCOUNTER — Encounter (INDEPENDENT_AMBULATORY_CARE_PROVIDER_SITE_OTHER): Payer: Self-pay | Admitting: Bariatrics

## 2022-05-01 DIAGNOSIS — J343 Hypertrophy of nasal turbinates: Secondary | ICD-10-CM | POA: Diagnosis not present

## 2022-05-01 DIAGNOSIS — R0982 Postnasal drip: Secondary | ICD-10-CM | POA: Diagnosis not present

## 2022-05-01 DIAGNOSIS — R42 Dizziness and giddiness: Secondary | ICD-10-CM | POA: Diagnosis not present

## 2022-05-01 DIAGNOSIS — H6123 Impacted cerumen, bilateral: Secondary | ICD-10-CM | POA: Diagnosis not present

## 2022-05-01 DIAGNOSIS — J310 Chronic rhinitis: Secondary | ICD-10-CM | POA: Diagnosis not present

## 2022-05-01 DIAGNOSIS — H903 Sensorineural hearing loss, bilateral: Secondary | ICD-10-CM | POA: Diagnosis not present

## 2022-05-01 NOTE — Progress Notes (Unsigned)
Cardiology  Note    Date:  05/02/2022   ID:  Maureen Elliott, DOB [birth year], MRN [id number]  PCP:  Sigmund Hazel, MD  Cardiologist:  Armanda Magic, MD   Chief Complaint  Patient presents with   Coronary Artery Disease   Atrial Fibrillation   Hyperlipidemia    History of Present Illness:  Maureen Elliott is a 76 y.o. female with a history of left lower extremity DVT, gestational diabetes, hyperlipidemia.  She presented to the ER in December 22 with vertigo and was dx with afib and started on Xarelto.    She presented again in January with acute dizziness and chest pain and was diagnosed with vertigo.  She also was having chest pain at the time.  Troponins were negative and EKG showed no evidence of ischemia.  She had been dx with COVID a few weeks prior and was started on Albuterol due to cough and URI sx.  2D echo showed normal LV function with EF 60 to 65% with grade 1 diastolic dysfunction, trivial MR and AR and mildly dilated ascending aorta at 39 mm.  12 lead EKG and tele monitoring on last hospitalization did not show any recurrent PAF.  She was started on Xarelto.  Coronary CTA was done showing minimal CAD with < 25% LAD stenosis.  Home sleep study showed no OSA.  She is here today for followup and is doing well.  She denies any chest pain or pressure, SOB, DOE, PND, orthopnea, LE edema, dizziness, palpitations or syncope. She is compliant with her meds and is tolerating meds with no SE.     Past Medical History:  Diagnosis Date   CAD (coronary artery disease), native coronary artery    minimal < 25% plaque LAD by coronary CTA 2/23   Complication of anesthesia    be careful with intubation due to previous cervical neck surgery   Cystocele    Diverticulitis    Diverticulosis    DVT (deep venous thrombosis) (HCC)    left leg   Gallbladder problem    Gestational diabetes    Glaucoma    Hemorrhoids internal and external   High cholesterol    History of gestational  diabetes yrs ago   Joint pain    Lactose intolerance    Nocturia    Osteoarthritis    PAF (paroxysmal atrial fibrillation) (HCC)    Partial hearing loss    PONV (postoperative nausea and vomiting) 2013   n/v after phenergan, injectable phenergan helps with nausea   Prediabetes     Past Surgical History:  Procedure Laterality Date   ABDOMINAL HYSTERECTOMY  2013   ovaries removed    BLADDER SURGERY     BREAST BIOPSY Bilateral 2001   cervical fusion C 5, C 6 and C 7  2004   colonscopy  feb 2015   blood in stool, just was hemorrhoids   CYSTOCELE REPAIR N/A 01/13/2014   Procedure: CYSTOSCOPY, REPAIR CYSTOCELE VAULT PROLAPSE/GRAFT;  Surgeon: Martina Sinner, MD;  Location: [facility];  Service: Urology;  Laterality: N/A;   EYE SURGERY Bilateral 8 yrs ago   lens replacement for cataract   GLAUCOMA SURGERY     SEPTOPLASTY Bilateral 03/01/2020   Procedure: NASAL SEPTOPLASTY;  Surgeon: Newman Pies, MD;  Location: [facility];  Service: ENT;  Laterality: Bilateral;   tummy tuck with mesh  6 yrs ago   large abdominal is present    Current Medications: Current Meds  Medication Sig  acetaminophen (TYLENOL) 325 MG tablet Take 650 mg by mouth every 6 (six) hours as needed for mild pain, fever or headache.   atorvastatin (LIPITOR) 10 MG tablet 1 tablet   brimonidine (ALPHAGAN) 0.2 % ophthalmic solution Place 1 drop into both eyes 3 (three) times daily.   CALCIUM 600 1500 (600 Ca) MG TABS tablet SMARTSIG:1 Tablet(s) By Mouth   Cholecalciferol (VITAMIN D) 50 MCG (2000 UT) CAPS Take 1 capsule (2,000 Units total) by mouth daily.   CHOLESTOFF PLUS 450 MG CAPS SMARTSIG:1 Capsule(s) By Mouth   conjugated estrogens (PREMARIN) vaginal cream Place 1 Applicatorful vaginally 2 (two) times a week.   melatonin 5 MG TABS Take 5 mg by mouth at bedtime.   Multiple Vitamin (MULTIVITAMIN WITH MINERALS) TABS tablet Take 1 tablet by mouth daily.   pilocarpine (PILOCAR) 1 % ophthalmic solution Place 1  drop into both eyes 3 (three) times daily.   timolol (TIMOPTIC) 0.5 % ophthalmic solution Place 1 drop into both eyes 2 (two) times daily.   Tretinoin 0.075 % CREA Apply 1 application topically daily. To prevent  nasal warts   XARELTO 20 MG TABS tablet Take 1 tablet (20 mg total) by mouth daily.    Allergies:   Amoxicillin, Attapulgite, Caffeine, Dorzolamide, Fish oil, Keflex [cephalexin], Levaquin [levofloxacin], Maxitrol [neomycin-polymyxin-dexameth], Methotrexate derivatives, Nsaids, Prednisone, Restasis [cyclosporine], Rofecoxib, and Valtrex [valacyclovir]   Social History   Socioeconomic History   Marital status: Married    Spouse name: Jake Shark "will"   Number of children: 1   Years of education: Not on file   Highest education level: Not on file  Occupational History   Not on file  Tobacco Use   Smoking status: Never   Smokeless tobacco: Never  Vaping Use   Vaping Use: Never used  Substance and Sexual Activity   Alcohol use: Yes    Comment: occasional wine with dinner   Drug use: No   Sexual activity: Not on file  Other Topics Concern   Not on file  Social History Narrative   Right handed    Lives with husband   Social Determinants of Health   Financial Resource Strain: Not on file  Food Insecurity: Not on file  Transportation Needs: Not on file  Physical Activity: Not on file  Stress: Not on file  Social Connections: Not on file     Family History:  The patient's family history includes Alcohol abuse in her father; Breast cancer in her maternal aunt, maternal aunt, and maternal aunt; Cancer in her father and mother; Hypertension in her father; Obesity in her father.   ROS:   Please see the history of present illness.    ROS All other systems reviewed and are negative.      No data to display             PHYSICAL EXAM:   VS:  BP 128/76   Pulse 73   Ht 5\' 3"  (1.6 m)   Wt 151 lb 3.2 oz (68.6 kg)   SpO2 96%   BMI 26.78 kg/m    GEN: Well nourished,  well developed in no acute distress HEENT: Normal NECK: No JVD; No carotid bruits LYMPHATICS: No lymphadenopathy CARDIAC:RRR, no murmurs, rubs, gallops RESPIRATORY:  Clear to auscultation without rales, wheezing or rhonchi  ABDOMEN: Soft, non-tender, non-distended MUSCULOSKELETAL:  No edema; No deformity  SKIN: Warm and dry NEUROLOGIC:  Alert and oriented x 3 PSYCHIATRIC:  Normal affect  on are intact Psych: euthymic mood, full affect  Wt Readings from Last 3 Encounters:  05/02/22 151 lb 3.2 oz (68.6 kg)  04/17/22 142 lb (64.4 kg)  03/20/22 141 lb (64 kg)      Studies/Labs Reviewed:   EKG:  EKG is not ordered today.   Recent Labs: 09/12/2021: Magnesium 1.9 10/05/2021: Hemoglobin 12.7; Platelets 228 04/17/2022: ALT 17; BUN 18; Creatinine, Ser 0.78; Potassium 4.1; Sodium 142   Lipid Panel    Component Value Date/Time   CHOL 169 04/17/2022 1158   TRIG 51 04/17/2022 1158   HDL 63 04/17/2022 1158   LDLCALC 96 04/17/2022 1158     CHA2DS2-VASc Score = 2  This indicates a 2.2% annual risk of stroke. The patient's score is based upon: CHF History: 0 HTN History: 0 Diabetes History: 0 Stroke History: 0 Vascular Disease History: 0 Age Score: 1 Gender Score: 1    Additional studies/ records that were reviewed today include:  Hospital notes from January 23 and 2D echo    ASSESSMENT:    1. Nonobstructive atherosclerosis of coronary artery   2. PAF (paroxysmal atrial fibrillation) (HCC)   3. Pure hypercholesterolemia      PLAN:  In order of problems listed above:  ASCAD -coronary CTA with minimal nonobstructive CAD with < 25% LAD -she denies any anginal sx -no ASA due to DOAC -continue statin therapy  2.  PAF -she is maintaining NSR on exam and denies any palpitations -CHADS2VASC score is 2   -2D echo showed normal LV function -[place] home sleep study  with no sleep apnea -no recurrent PAF on event monitor 11/2021 -continue prescription drug management  with Xarelto 20mg  daily with PRN refills -I have personally reviewed and interpreted outside labs performed by patient's PCP which showed SCr0.78 and Hbg 12.7 in 2023   3.  HLD -LDL goal < 70 -lipids 04/17/2022 showed LDL 96, HDL 64 -increase Atorvastatin to 20mg  daily and repeat FLP and ALT In 6 weeks  Time Spent: 20 minutes total time of encounter, including 15 minutes spent in face-to-face patient care on the date of this encounter. This time includes coordination of care and counseling regarding above mentioned problem list. Remainder of non-face-to-face time involved reviewing chart documents/testing relevant to the patient encounter and documentation in the medical record. I have independently reviewed documentation from referring provider  Medication Adjustments/Labs and Tests Ordered: Current medicines are reviewed at length with the patient today.  Concerns regarding medicines are outlined above.  Medication changes, Labs and Tests ordered today are listed in the Patient Instructions below.  There are no Patient Instructions on file for this visit.   Signed, 04/19/2022, MD  05/02/2022 9:19 AM    [facility] [address] [place], [place], [place]  [place] Phone: [phone]; Fax: [phone]

## 2022-05-02 ENCOUNTER — Encounter: Payer: Self-pay | Admitting: Cardiology

## 2022-05-02 ENCOUNTER — Ambulatory Visit: Payer: Medicare PPO | Admitting: Cardiology

## 2022-05-02 VITALS — BP 128/76 | HR 73 | Ht 63.0 in | Wt 151.2 lb

## 2022-05-02 DIAGNOSIS — I480 Paroxysmal atrial fibrillation: Secondary | ICD-10-CM | POA: Diagnosis not present

## 2022-05-02 DIAGNOSIS — E7800 Pure hypercholesterolemia, unspecified: Secondary | ICD-10-CM

## 2022-05-02 DIAGNOSIS — I2510 Atherosclerotic heart disease of native coronary artery without angina pectoris: Secondary | ICD-10-CM | POA: Diagnosis not present

## 2022-05-02 MED ORDER — ATORVASTATIN CALCIUM 20 MG PO TABS: 20.0000 mg | ORAL_TABLET | Freq: Every day | ORAL | 3 refills | Status: DC

## 2022-05-02 NOTE — Patient Instructions (Signed)
Medication Instructions:  Your physician has recommended you make the following change in your medication:  1) INCREASE Lipitor (atorvastatin) to 20 mg daily *If you need a refill on your cardiac medications before your next appointment, please call your pharmacy*   Lab Work: IN 6 WEEKS: Fasting lipids and ALT If you have labs (blood work) drawn today and your tests are completely normal, you will receive your results only by: MyChart Message (if you have MyChart) OR A paper copy in the mail If you have any lab test that is abnormal or we need to change your treatment, we will call you to review the results.  Follow-Up: At [facility], you and your health needs are our priority.  As part of our continuing mission to provide you with exceptional heart care, we have created designated Provider Care Teams.  These Care Teams include your primary Cardiologist (physician) and Advanced Practice Providers (APPs -  Physician Assistants and Nurse Practitioners) who all work together to provide you with the care you need, when you need it.  Your next appointment:   1 year(s)  The format for your next appointment:   In Person  Provider:   Armanda Magic, MD     Important Information About Sugar

## 2022-05-02 NOTE — Addendum Note (Signed)
Addended by: Theresia Majors on: 05/02/2022 09:29 AM   Modules accepted: Orders

## 2022-05-03 ENCOUNTER — Encounter (INDEPENDENT_AMBULATORY_CARE_PROVIDER_SITE_OTHER): Payer: Self-pay

## 2022-05-04 ENCOUNTER — Ambulatory Visit
Admission: RE | Admit: 2022-05-04 | Discharge: 2022-05-04 | Disposition: A | Discharge status: Home / Self Care | Payer: Medicare PPO | Source: Ambulatory Visit | Attending: Family Medicine | Admitting: Family Medicine

## 2022-05-04 DIAGNOSIS — Z1231 Encounter for screening mammogram for malignant neoplasm of breast: Secondary | ICD-10-CM

## 2022-05-15 DIAGNOSIS — H401113 Primary open-angle glaucoma, right eye, severe stage: Secondary | ICD-10-CM | POA: Diagnosis not present

## 2022-05-15 DIAGNOSIS — H401121 Primary open-angle glaucoma, left eye, mild stage: Secondary | ICD-10-CM | POA: Diagnosis not present

## 2022-05-17 ENCOUNTER — Encounter (INDEPENDENT_AMBULATORY_CARE_PROVIDER_SITE_OTHER): Payer: Self-pay | Admitting: Bariatrics

## 2022-05-17 ENCOUNTER — Ambulatory Visit (INDEPENDENT_AMBULATORY_CARE_PROVIDER_SITE_OTHER): Payer: Medicare PPO | Admitting: Bariatrics

## 2022-05-17 VITALS — BP 117/73 | HR 79 | Temp 98.0°F | Ht 63.0 in | Wt 143.0 lb

## 2022-05-17 DIAGNOSIS — Z6825 Body mass index (BMI) 25.0-25.9, adult: Secondary | ICD-10-CM

## 2022-05-17 DIAGNOSIS — E669 Obesity, unspecified: Secondary | ICD-10-CM

## 2022-05-17 DIAGNOSIS — E8881 Metabolic syndrome: Secondary | ICD-10-CM | POA: Diagnosis not present

## 2022-05-17 DIAGNOSIS — E7800 Pure hypercholesterolemia, unspecified: Secondary | ICD-10-CM

## 2022-05-22 NOTE — Progress Notes (Unsigned)
Chief Complaint:   OBESITY Maureen Elliott is here to discuss her progress with her obesity treatment plan along with follow-up of her obesity related diagnoses. Maureen Elliott is on the Category 3 Plan and states she is following her eating plan approximately 0% of the time. Maureen Elliott states she is doing Geophysicist/field seismologist program at [employer] for 50 minutes 4 times per week.  Today's visit was #: 27 Starting weight: 171 lbs Starting date: 04/28/2020 Today's weight: 143 lbs Today's date: 05/17/22 Total lbs lost to date: 28 Total lbs lost since last in-office visit: +1  Interim History: She is up 1 pound since her last visit.  She went to see the cardiologist.  Subjective:   1. Pure hypercholesterolemia Taking Lipitor and plant sterols.  Last LDL was 99.  HDL was 63.  2. Insulin resistance No medication.  Assessment/Plan:   1. Pure hypercholesterolemia 1.  Continue Lipitor. 2.  No trans fats 3.  Increase Lipitor per her PCP, cardiologist.  2. Insulin resistance 1.  Continue to minimize sweets and starches.  3. Obesity, Current BMI 25.3 1.  Meal planning 2.  Intentional eating 3.  Reviewed labs-CMP, lipids, vitamin D.  Maureen Elliott is currently in the action stage of change. As such, her goal is to continue with weight loss efforts. She has agreed to the Category 3 Plan.   [facility].  Start back to plan.  Behavioral modification strategies: increasing lean protein intake, decreasing simple carbohydrates, increasing vegetables, increasing water intake, decreasing eating out, no skipping meals, meal planning and cooking strategies, keeping healthy foods in the home, and planning for success.  Maureen Elliott has agreed to follow-up with our clinic in 4 weeks. She was informed of the importance of frequent follow-up visits to maximize her success with intensive lifestyle modifications for her multiple health conditions.    Objective:   Blood pressure 117/73,  pulse 79, temperature 98 F (36.7 C), height 5\' 3"  (1.6 m), weight 143 lb (64.9 kg), SpO2 96 %. Body mass index is 25.33 kg/m.  General: Cooperative, alert, well developed, in no acute distress. HEENT: Conjunctivae and lids unremarkable. Cardiovascular: Regular rhythm.  Lungs: Normal work of breathing. Neurologic: No focal deficits.   Lab Results  Component Value Date   CREATININE 0.78 04/17/2022   BUN 18 04/17/2022   NA 142 04/17/2022   K 4.1 04/17/2022   CL 103 04/17/2022   CO2 25 04/17/2022   Lab Results  Component Value Date   ALT 17 04/17/2022   AST 20 04/17/2022   ALKPHOS 68 04/17/2022   BILITOT 0.4 04/17/2022   Lab Results  Component Value Date   HGBA1C 5.3 10/04/2021   HGBA1C 5.7 (H) 03/31/2021   HGBA1C 5.5 11/09/2020   HGBA1C 5.8 (H) 08/05/2020   HGBA1C 5.7 (H) 04/28/2020   Lab Results  Component Value Date   INSULIN 2.8 11/09/2020   INSULIN 5.1 08/05/2020   INSULIN 7.6 04/28/2020   Lab Results  Component Value Date   TSH 1.560 04/28/2020   Lab Results  Component Value Date   CHOL 169 04/17/2022   HDL 63 04/17/2022   LDLCALC 96 04/17/2022   TRIG 51 04/17/2022   Lab Results  Component Value Date   VD25OH 49.0 04/17/2022   VD25OH 51.2 03/31/2021   VD25OH 76.1 11/09/2020   Lab Results  Component Value Date   WBC 8.0 10/05/2021   HGB 12.7 10/05/2021   HCT 38.6 10/05/2021  MCV 93.5 10/05/2021   PLT 228 10/05/2021   No results found for: "IRON", "TIBC", "FERRITIN"   Attestation Statements:   Reviewed by clinician on day of visit: allergies, medications, problem list, medical history, surgical history, family history, social history, and previous encounter notes.  I, Dawn Whitmire, FNP-C, am acting as transcriptionist for Dr. Corinna Capra.  I have reviewed the above documentation for accuracy and completeness, and I agree with the above. Corinna Capra, DO

## 2022-05-23 ENCOUNTER — Encounter (INDEPENDENT_AMBULATORY_CARE_PROVIDER_SITE_OTHER): Payer: Self-pay | Admitting: Bariatrics

## 2022-06-08 DIAGNOSIS — H401113 Primary open-angle glaucoma, right eye, severe stage: Secondary | ICD-10-CM | POA: Diagnosis not present

## 2022-06-08 DIAGNOSIS — Z79899 Other long term (current) drug therapy: Secondary | ICD-10-CM | POA: Diagnosis not present

## 2022-06-08 DIAGNOSIS — H401121 Primary open-angle glaucoma, left eye, mild stage: Secondary | ICD-10-CM | POA: Diagnosis not present

## 2022-06-13 ENCOUNTER — Ambulatory Visit: Payer: Medicare PPO | Attending: Cardiology

## 2022-06-13 DIAGNOSIS — E7800 Pure hypercholesterolemia, unspecified: Secondary | ICD-10-CM | POA: Diagnosis not present

## 2022-06-13 DIAGNOSIS — I480 Paroxysmal atrial fibrillation: Secondary | ICD-10-CM

## 2022-06-13 DIAGNOSIS — I2510 Atherosclerotic heart disease of native coronary artery without angina pectoris: Secondary | ICD-10-CM

## 2022-06-13 LAB — ALT: ALT: 19 IU/L (ref 0–32)

## 2022-06-13 LAB — LIPID PANEL
Chol/HDL Ratio: 2.4 ratio (ref 0.0–4.4)
Cholesterol, Total: 147 mg/dL (ref 100–199)
HDL: 61 mg/dL (ref >39)
LDL Chol Calc (NIH): 76 mg/dL (ref 0–99)
Triglycerides: 47 mg/dL (ref 0–149)
VLDL Cholesterol Cal: 10 mg/dL (ref 5–40)

## 2022-06-20 ENCOUNTER — Telehealth: Payer: Self-pay

## 2022-06-20 DIAGNOSIS — E7800 Pure hypercholesterolemia, unspecified: Secondary | ICD-10-CM

## 2022-06-20 MED ORDER — ATORVASTATIN CALCIUM 40 MG PO TABS: 40.0000 mg | ORAL_TABLET | Freq: Every day | ORAL | 3 refills | Status: DC

## 2022-06-20 NOTE — Telephone Encounter (Signed)
The patient has been notified of the result and verbalized understanding.  All questions (if any) were answered. Antonieta Iba, RN 06/20/2022 2:53 PM  New prescription has been sent in for the patient. Labs have been scheduled.

## 2022-06-20 NOTE — Telephone Encounter (Signed)
-----   Message from Sueanne Margarita, MD sent at 06/13/2022  7:36 PM EDT ----- LDL still not at goal, increase Atorvastatin to 40mg  daily and repeat FLP and ALT in 6 weeks

## 2022-06-23 ENCOUNTER — Telehealth: Payer: Self-pay | Admitting: Cardiology

## 2022-06-23 NOTE — Telephone Encounter (Signed)
Called pt advised lab appointment changed to 08/15/22 per request. Reminded pt to fast prior to appointment; and that can come in anytime between the hours of 7:30 am to 4:30 pm. No questions or concerns voiced.

## 2022-06-23 NOTE — Telephone Encounter (Signed)
Patient would like to do her repeat lab work on 08/15/22, I did not see any labs ordered for her in [place]. Please see phone note date 06/20/22

## 2022-06-27 ENCOUNTER — Encounter: Payer: Self-pay | Admitting: Bariatrics

## 2022-06-27 ENCOUNTER — Ambulatory Visit: Payer: Medicare PPO | Admitting: Bariatrics

## 2022-06-27 VITALS — BP 123/72 | HR 70 | Temp 98.1°F | Ht 63.0 in | Wt 145.0 lb

## 2022-06-27 DIAGNOSIS — E7849 Other hyperlipidemia: Secondary | ICD-10-CM | POA: Insufficient documentation

## 2022-06-27 DIAGNOSIS — E88819 Insulin resistance, unspecified: Secondary | ICD-10-CM | POA: Diagnosis not present

## 2022-06-27 DIAGNOSIS — E669 Obesity, unspecified: Secondary | ICD-10-CM

## 2022-06-27 DIAGNOSIS — Z6825 Body mass index (BMI) 25.0-25.9, adult: Secondary | ICD-10-CM

## 2022-07-04 NOTE — Progress Notes (Signed)
Chief Complaint:   OBESITY Maureen Elliott is here to discuss her progress with her obesity treatment plan along with follow-up of her obesity related diagnoses. Maureen Elliott is on the Category 3 Plan and states she is following her eating plan approximately 50% of the time. Maureen Elliott states she is doing Artist for 45 minutes 3 times per week.  Today's visit was #: 28 Starting weight: 171 lbs Starting date: 04/28/2020 Today's weight: 145 lbs Today's date: 06/27/2022 Total lbs lost to date: 26 Total lbs lost since last in-office visit: 0  Interim History: Maureen Elliott is up 2 pounds since her last visit, but she has done well overall.  Her muscle mass continues to go up slightly.  She has been out of town and has not been eating as well.  Subjective:   1. Insulin resistance Maureen Elliott's insulin level is currently well controlled.  2. Other hyperlipidemia Maureen Elliott is taking Lipitor 40 mg, increased from 20 mg.  She had a coronary calcium scan, CCS 1.5 and minimal CAD <25%.  Assessment/Plan:   1. Insulin resistance Maureen Elliott will keep all carbohydrates low (sugar and starches).  Fiber handout was given to the patient.  2. Other hyperlipidemia Maureen Elliott will continue her medications as directed.  3. Obesity, Current BMI 25.7 Maureen Elliott is currently in the action stage of change. As such, her goal is to continue with weight loss efforts. She has agreed to the Category 3 Plan and keeping a food journal and adhering to recommended goals of 1500 calories and 90 grams of protein daily.   Meal planning and intentional eating were discussed.  Reviewed labs with the patient today from 06/13/2022, lipids and ALT.  Exercise goals: As is.   Behavioral modification strategies: increasing lean protein intake, decreasing simple carbohydrates, increasing vegetables, increasing water intake, decreasing eating out, no skipping meals, meal planning and cooking strategies, keeping healthy foods in the home, and planning  for success.  Maureen Elliott has agreed to follow-up with our clinic in 4 weeks. She was informed of the importance of frequent follow-up visits to maximize her success with intensive lifestyle modifications for her multiple health conditions.   Objective:   Blood pressure 123/72, pulse 70, temperature 98.1 F (36.7 C), height 5\' 3"  (1.6 m), weight 145 lb (65.8 kg), SpO2 99 %. Body mass index is 25.69 kg/m.  General: Cooperative, alert, well developed, in no acute distress. HEENT: Conjunctivae and lids unremarkable. Cardiovascular: Regular rhythm.  Lungs: Normal work of breathing. Neurologic: No focal deficits.   Lab Results  Component Value Date   CREATININE 0.78 04/17/2022   BUN 18 04/17/2022   NA 142 04/17/2022   K 4.1 04/17/2022   CL 103 04/17/2022   CO2 25 04/17/2022   Lab Results  Component Value Date   ALT 19 06/13/2022   AST 20 04/17/2022   ALKPHOS 68 04/17/2022   BILITOT 0.4 04/17/2022   Lab Results  Component Value Date   HGBA1C 5.3 10/04/2021   HGBA1C 5.7 (H) 03/31/2021   HGBA1C 5.5 11/09/2020   HGBA1C 5.8 (H) 08/05/2020   HGBA1C 5.7 (H) 04/28/2020   Lab Results  Component Value Date   INSULIN 2.8 11/09/2020   INSULIN 5.1 08/05/2020   INSULIN 7.6 04/28/2020   Lab Results  Component Value Date   TSH 1.560 04/28/2020   Lab Results  Component Value Date   CHOL 147 06/13/2022   HDL 61 06/13/2022   LDLCALC 76 06/13/2022   TRIG 47 06/13/2022   CHOLHDL 2.4 06/13/2022  Lab Results  Component Value Date   VD25OH 49.0 04/17/2022   VD25OH 51.2 03/31/2021   VD25OH 76.1 11/09/2020   Lab Results  Component Value Date   WBC 8.0 10/05/2021   HGB 12.7 10/05/2021   HCT 38.6 10/05/2021   MCV 93.5 10/05/2021   PLT 228 10/05/2021   No results found for: "IRON", "TIBC", "FERRITIN"  Attestation Statements:   Reviewed by clinician on day of visit: allergies, medications, problem list, medical history, surgical history, family history, social history, and  previous encounter notes.   Trude Mcburney, am acting as Energy manager for [employer], DO.  I have reviewed the above documentation for accuracy and completeness, and I agree with the above. Corinna Capra, DO

## 2022-07-13 DIAGNOSIS — H608X3 Other otitis externa, bilateral: Secondary | ICD-10-CM | POA: Diagnosis not present

## 2022-07-13 DIAGNOSIS — H6123 Impacted cerumen, bilateral: Secondary | ICD-10-CM | POA: Diagnosis not present

## 2022-07-26 ENCOUNTER — Other Ambulatory Visit: Payer: Self-pay | Admitting: Cardiology

## 2022-07-26 DIAGNOSIS — I700 Atherosclerosis of aorta: Secondary | ICD-10-CM

## 2022-08-02 ENCOUNTER — Ambulatory Visit: Payer: Medicare PPO | Admitting: Bariatrics

## 2022-08-02 ENCOUNTER — Encounter: Payer: Self-pay | Admitting: Bariatrics

## 2022-08-02 VITALS — BP 110/71 | HR 72 | Temp 98.1°F | Ht 63.0 in | Wt 144.0 lb

## 2022-08-02 DIAGNOSIS — E7800 Pure hypercholesterolemia, unspecified: Secondary | ICD-10-CM

## 2022-08-02 DIAGNOSIS — Z6825 Body mass index (BMI) 25.0-25.9, adult: Secondary | ICD-10-CM | POA: Diagnosis not present

## 2022-08-02 DIAGNOSIS — E88819 Insulin resistance, unspecified: Secondary | ICD-10-CM

## 2022-08-02 DIAGNOSIS — E669 Obesity, unspecified: Secondary | ICD-10-CM

## 2022-08-02 DIAGNOSIS — Z6830 Body mass index (BMI) 30.0-30.9, adult: Secondary | ICD-10-CM

## 2022-08-14 ENCOUNTER — Encounter: Payer: Self-pay | Admitting: Bariatrics

## 2022-08-14 NOTE — Progress Notes (Signed)
Chief Complaint:   OBESITY Maureen Elliott is here to discuss her progress with her obesity treatment plan along with follow-up of her obesity related diagnoses. [name] is on the Category 3 Plan and keeping a food journal and adhering to recommended goals of 1500 calories and 90 grams of protein and states she is following her eating plan approximately 90% of the time. [name] states she is at the gym for 45 minutes 3-5 times per week.  Today's visit was #: 29 Starting weight: 171 lbs Starting date: 04/28/2020 Today's weight: 144 lbs Today's date: 08/02/2022 Total lbs lost to date: 27 Total lbs lost since last in-office visit: 1  Interim History: [name] is down 1 lb since her last visit. She is in the maintenance phase.   Subjective:   1. Insulin resistance [name] is not on medications.   2. Hypercholesterolemia [name] is taking Lipitor.   Assessment/Plan:   1. Insulin resistance Bree will keep all carbohydrates low (sugar and starches).   2. Hypercholesterolemia Aniyiah will continue Lipitor as directed.   3. Obesity, Current BMI 25.6 [name] is currently in the action stage of change. As such, her goal is to maintain weight for now. She has agreed to the Category 3 Plan and keeping a food journal and adhering to recommended goals of 1500 calories and 90 grams of protein.   Meal planning was discussed. Keep water and protein intake high.   Exercise goals: As is.   Behavioral modification strategies: increasing lean protein intake, decreasing simple carbohydrates, increasing vegetables, increasing water intake, decreasing eating out, no skipping meals, meal planning and cooking strategies, and keeping healthy foods in the home.  [name] has agreed to follow-up with our clinic in 6 weeks. She was informed of the importance of frequent follow-up visits to maximize her success with intensive lifestyle modifications for her multiple health conditions.   Objective:   Blood pressure 110/71,  pulse 72, temperature 98.1 F (36.7 C), height 5\' 3"  (1.6 m), weight 144 lb (65.3 kg), SpO2 99 %. Body mass index is 25.51 kg/m.  General: Cooperative, alert, well developed, in no acute distress. HEENT: Conjunctivae and lids unremarkable. Cardiovascular: Regular rhythm.  Lungs: Normal work of breathing. Neurologic: No focal deficits.   Lab Results  Component Value Date   CREATININE 0.78 04/17/2022   BUN 18 04/17/2022   NA 142 04/17/2022   K 4.1 04/17/2022   CL 103 04/17/2022   CO2 25 04/17/2022   Lab Results  Component Value Date   ALT 19 06/13/2022   AST 20 04/17/2022   ALKPHOS 68 04/17/2022   BILITOT 0.4 04/17/2022   Lab Results  Component Value Date   HGBA1C 5.3 10/04/2021   HGBA1C 5.7 (H) 03/31/2021   HGBA1C 5.5 11/09/2020   HGBA1C 5.8 (H) 08/05/2020   HGBA1C 5.7 (H) 04/28/2020   Lab Results  Component Value Date   INSULIN 2.8 11/09/2020   INSULIN 5.1 08/05/2020   INSULIN 7.6 04/28/2020   Lab Results  Component Value Date   TSH 1.560 04/28/2020   Lab Results  Component Value Date   CHOL 147 06/13/2022   HDL 61 06/13/2022   LDLCALC 76 06/13/2022   TRIG 47 06/13/2022   CHOLHDL 2.4 06/13/2022   Lab Results  Component Value Date   VD25OH 49.0 04/17/2022   VD25OH 51.2 03/31/2021   VD25OH 76.1 11/09/2020   Lab Results  Component Value Date   WBC 8.0 10/05/2021   HGB 12.7 10/05/2021   HCT 38.6 10/05/2021  MCV 93.5 10/05/2021   PLT 228 10/05/2021   No results found for: "IRON", "TIBC", "FERRITIN"  Attestation Statements:   Reviewed by clinician on day of visit: allergies, medications, problem list, medical history, surgical history, family history, social history, and previous encounter notes.   Trude Mcburney, am acting as Energy manager for [employer], DO.  I have reviewed the above documentation for accuracy and completeness, and I agree with the above. Corinna Capra, DO

## 2022-08-15 ENCOUNTER — Ambulatory Visit: Payer: Medicare PPO | Attending: Cardiology

## 2022-08-15 DIAGNOSIS — E7800 Pure hypercholesterolemia, unspecified: Secondary | ICD-10-CM | POA: Diagnosis not present

## 2022-08-15 LAB — ALT: ALT: 21 IU/L (ref 0–32)

## 2022-08-15 LAB — LIPID PANEL
Chol/HDL Ratio: 2.4 ratio (ref 0.0–4.4)
Cholesterol, Total: 144 mg/dL (ref 100–199)
HDL: 59 mg/dL (ref >39)
LDL Chol Calc (NIH): 73 mg/dL (ref 0–99)
Triglycerides: 54 mg/dL (ref 0–149)
VLDL Cholesterol Cal: 12 mg/dL (ref 5–40)

## 2022-08-21 ENCOUNTER — Other Ambulatory Visit: Payer: Self-pay

## 2022-08-21 DIAGNOSIS — E7800 Pure hypercholesterolemia, unspecified: Secondary | ICD-10-CM

## 2022-08-21 MED ORDER — EZETIMIBE 10 MG PO TABS: 10.0000 mg | ORAL_TABLET | Freq: Every day | ORAL | 3 refills | Status: DC

## 2022-08-23 ENCOUNTER — Other Ambulatory Visit: Payer: Medicare PPO

## 2022-08-23 DIAGNOSIS — R3915 Urgency of urination: Secondary | ICD-10-CM | POA: Diagnosis not present

## 2022-08-23 DIAGNOSIS — R197 Diarrhea, unspecified: Secondary | ICD-10-CM | POA: Diagnosis not present

## 2022-08-23 DIAGNOSIS — M545 Low back pain, unspecified: Secondary | ICD-10-CM | POA: Diagnosis not present

## 2022-08-23 DIAGNOSIS — Z6826 Body mass index (BMI) 26.0-26.9, adult: Secondary | ICD-10-CM | POA: Diagnosis not present

## 2022-09-09 ENCOUNTER — Other Ambulatory Visit: Payer: Self-pay | Admitting: Cardiology

## 2022-09-11 NOTE — Telephone Encounter (Signed)
Prescription refill request for Xarelto received.  Indication:afib Last office visit:8/23 Weight:65.3 kg Age:76 Scr:0.7 CrCl:71.58  ml/min  Prescription refilled

## 2022-09-13 ENCOUNTER — Encounter: Payer: Self-pay | Admitting: Bariatrics

## 2022-09-13 ENCOUNTER — Ambulatory Visit: Payer: Medicare PPO | Admitting: Bariatrics

## 2022-09-13 VITALS — BP 131/73 | HR 73 | Temp 97.8°F | Ht 63.0 in | Wt 146.0 lb

## 2022-09-13 DIAGNOSIS — E88819 Insulin resistance, unspecified: Secondary | ICD-10-CM | POA: Diagnosis not present

## 2022-09-13 DIAGNOSIS — Z6825 Body mass index (BMI) 25.0-25.9, adult: Secondary | ICD-10-CM

## 2022-09-13 DIAGNOSIS — E7800 Pure hypercholesterolemia, unspecified: Secondary | ICD-10-CM | POA: Diagnosis not present

## 2022-09-13 DIAGNOSIS — E669 Obesity, unspecified: Secondary | ICD-10-CM | POA: Diagnosis not present

## 2022-10-02 NOTE — Patient Instructions (Signed)

## 2022-10-03 ENCOUNTER — Encounter: Payer: Self-pay | Admitting: Bariatrics

## 2022-10-03 NOTE — Progress Notes (Signed)
Chief Complaint:   OBESITY Maureen Elliott is here to discuss her progress with her obesity treatment plan along with follow-up of her obesity related diagnoses. Maureen Elliott is on the Category 3 Plan and keeping a food journal and adhering to recommended goals of 1500 calories and 90 grams of protein and states she is following her eating plan approximately (unknown)% of the time. Maureen Elliott states she is doing 0 minutes 0 times per week.  Today's visit was #: 30 Starting weight: 171 lbs Starting date: 04/28/2020 Today's weight: 146 lbs Today's date: 09/13/2022 Total lbs lost to date: 25 Total lbs lost since last in-office visit: 0  Interim History: Maureen Elliott is up 2 pounds since her last visit.  She is up 1.4 pounds of water weight per our bioimpedance scale.  Subjective:   1. Pure hypercholesterolemia [name] is currently taking Zetia and Lipitor.  2. Insulin resistance [name] is not on medications currently.  Assessment/Plan:   1. Pure hypercholesterolemia [name] will continue her medications as directed.  2. Insulin resistance [name] will minimize all carbohydrates (sugar and starches).   3. Obesity, Current BMI 25.9 [name] is currently in the action stage of change. As such, her goal is to continue with weight loss efforts. She has agreed to the Category 3 Plan and keeping a food journal and adhering to recommended goals of 1500 calories and 90 grams of protein.   Meal planning and intentional eating were discussed.  She will get back on track, and increase her water intake.  Exercise goals: She will resume the gym.  Behavioral modification strategies: increasing lean protein intake, decreasing simple carbohydrates, increasing vegetables, increasing water intake, decreasing eating out, no skipping meals, meal planning and cooking strategies, keeping healthy foods in the home, and planning for success.  Maureen Elliott has agreed to follow-up with our clinic in 4 weeks. She was informed of the importance of  frequent follow-up visits to maximize her success with intensive lifestyle modifications for her multiple health conditions.   Objective:   Blood pressure 131/73, pulse 73, temperature 97.8 F (36.6 C), height 5\' 3"  (1.6 m), weight 146 lb (66.2 kg), SpO2 98 %. Body mass index is 25.86 kg/m.  General: Cooperative, alert, well developed, in no acute distress. HEENT: Conjunctivae and lids unremarkable. Cardiovascular: Regular rhythm.  Lungs: Normal work of breathing. Neurologic: No focal deficits.   Lab Results  Component Value Date   CREATININE 0.78 04/17/2022   BUN 18 04/17/2022   NA 142 04/17/2022   K 4.1 04/17/2022   CL 103 04/17/2022   CO2 25 04/17/2022   Lab Results  Component Value Date   ALT 21 08/15/2022   AST 20 04/17/2022   ALKPHOS 68 04/17/2022   BILITOT 0.4 04/17/2022   Lab Results  Component Value Date   HGBA1C 5.3 10/04/2021   HGBA1C 5.7 (H) 03/31/2021   HGBA1C 5.5 11/09/2020   HGBA1C 5.8 (H) 08/05/2020   HGBA1C 5.7 (H) 04/28/2020   Lab Results  Component Value Date   INSULIN 2.8 11/09/2020   INSULIN 5.1 08/05/2020   INSULIN 7.6 04/28/2020   Lab Results  Component Value Date   TSH 1.560 04/28/2020   Lab Results  Component Value Date   CHOL 144 08/15/2022   HDL 59 08/15/2022   LDLCALC 73 08/15/2022   TRIG 54 08/15/2022   CHOLHDL 2.4 08/15/2022   Lab Results  Component Value Date   VD25OH 49.0 04/17/2022   VD25OH 51.2 03/31/2021   VD25OH 76.1 11/09/2020   Lab  Results  Component Value Date   WBC 8.0 10/05/2021   HGB 12.7 10/05/2021   HCT 38.6 10/05/2021   MCV 93.5 10/05/2021   PLT 228 10/05/2021   No results found for: "IRON", "TIBC", "FERRITIN"  Attestation Statements:   Reviewed by clinician on day of visit: allergies, medications, problem list, medical history, surgical history, family history, social history, and previous encounter notes.   Wilhemena Durie, am acting as Location manager for [employer], DO.  I have reviewed  the above documentation for accuracy and completeness, and I agree with the above. Jearld Lesch, DO

## 2022-10-10 ENCOUNTER — Encounter: Payer: Self-pay | Admitting: Bariatrics

## 2022-10-10 ENCOUNTER — Ambulatory Visit: Payer: Medicare PPO | Admitting: Bariatrics

## 2022-10-10 VITALS — BP 120/56 | HR 71 | Temp 98.0°F | Ht 63.0 in | Wt 146.0 lb

## 2022-10-10 DIAGNOSIS — E7849 Other hyperlipidemia: Secondary | ICD-10-CM | POA: Diagnosis not present

## 2022-10-10 DIAGNOSIS — E88819 Insulin resistance, unspecified: Secondary | ICD-10-CM | POA: Diagnosis not present

## 2022-10-10 DIAGNOSIS — E669 Obesity, unspecified: Secondary | ICD-10-CM | POA: Diagnosis not present

## 2022-10-10 DIAGNOSIS — Z6825 Body mass index (BMI) 25.0-25.9, adult: Secondary | ICD-10-CM | POA: Diagnosis not present

## 2022-10-11 DIAGNOSIS — H401121 Primary open-angle glaucoma, left eye, mild stage: Secondary | ICD-10-CM | POA: Diagnosis not present

## 2022-10-11 DIAGNOSIS — H18452 Nodular corneal degeneration, left eye: Secondary | ICD-10-CM | POA: Diagnosis not present

## 2022-10-11 DIAGNOSIS — H401113 Primary open-angle glaucoma, right eye, severe stage: Secondary | ICD-10-CM | POA: Diagnosis not present

## 2022-10-16 ENCOUNTER — Ambulatory Visit: Payer: Medicare PPO | Attending: Cardiology

## 2022-10-16 DIAGNOSIS — E7800 Pure hypercholesterolemia, unspecified: Secondary | ICD-10-CM

## 2022-10-16 LAB — LIPID PANEL
Chol/HDL Ratio: 2 ratio (ref 0.0–4.4)
Cholesterol, Total: 117 mg/dL (ref 100–199)
HDL: 59 mg/dL (ref >39)
LDL Chol Calc (NIH): 47 mg/dL (ref 0–99)
Triglycerides: 45 mg/dL (ref 0–149)
VLDL Cholesterol Cal: 11 mg/dL (ref 5–40)

## 2022-10-16 LAB — ALT: ALT: 23 IU/L (ref 0–32)

## 2022-10-17 DIAGNOSIS — Z6827 Body mass index (BMI) 27.0-27.9, adult: Secondary | ICD-10-CM | POA: Diagnosis not present

## 2022-10-17 DIAGNOSIS — R195 Other fecal abnormalities: Secondary | ICD-10-CM | POA: Diagnosis not present

## 2022-10-17 DIAGNOSIS — K921 Melena: Secondary | ICD-10-CM | POA: Diagnosis not present

## 2022-10-18 ENCOUNTER — Encounter: Payer: Self-pay | Admitting: Bariatrics

## 2022-10-18 NOTE — Progress Notes (Signed)
Chief Complaint:   OBESITY Maureen Elliott is here to discuss her progress with her obesity treatment plan along with follow-up of her obesity related diagnoses. Maureen Elliott is on the Category 3 Plan and keeping a food journal and adhering to recommended goals of 1500 calories and 90 grams of protein and states she is following her eating plan approximately 100% of the time. Maureen Elliott states she is doing Chief of Staff for 45-50 minutes 7 times per week.  Today's visit was #: 19 Starting weight: 171 lbs Starting date: 04/28/2020 Today's weight: 146 lbs Today's date: 10/10/2022 Total lbs lost to date: 25 Total lbs lost since last in-office visit: 0  Interim History: Maureen Elliott's weight remains the same at her last visit.  She is trying to maintain her weight.  She has increased her fruits and vegetables.  Subjective:   1. Insulin resistance Maureen Elliott is cutting her carbohydrates, and she notes her appetite is normal to high.  2. Other hyperlipidemia Maureen Elliott is currently taking Lipitor.  Assessment/Plan:   1. Insulin resistance Maureen Elliott will eat healthy carbohydrates.  2. Other hyperlipidemia Maureen Elliott will eliminate trans fats, and will minimize saturated fats.  3. Obesity, Current BMI 25.9 Maureen Elliott is currently in the action stage of change. As such, her goal is to maintain weight for now. She has agreed to the Category 3 Plan.   We discussed ways to maintain her weight. Exercise goals: As is.   Behavioral modification strategies: increasing lean protein intake, decreasing simple carbohydrates, increasing vegetables, increasing water intake, decreasing eating out, no skipping meals, meal planning and cooking strategies, keeping healthy foods in the home, and ways to avoid boredom eating.  Maureen Elliott has agreed to follow-up with our clinic in 6 weeks. She was informed of the importance of frequent follow-up visits to maximize her success with intensive lifestyle modifications for her multiple health conditions.    Objective:   Blood pressure (!) 120/56, pulse 71, temperature 98 F (36.7 C), height 5\' 3"  (1.6 m), weight 146 lb (66.2 kg), SpO2 97 %. Body mass index is 25.86 kg/m.  General: Cooperative, alert, well developed, in no acute distress. HEENT: Conjunctivae and lids unremarkable. Cardiovascular: Regular rhythm.  Lungs: Normal work of breathing. Neurologic: No focal deficits.   Lab Results  Component Value Date   CREATININE 0.78 04/17/2022   BUN 18 04/17/2022   NA 142 04/17/2022   K 4.1 04/17/2022   CL 103 04/17/2022   CO2 25 04/17/2022   Lab Results  Component Value Date   ALT 23 10/16/2022   AST 20 04/17/2022   ALKPHOS 68 04/17/2022   BILITOT 0.4 04/17/2022   Lab Results  Component Value Date   HGBA1C 5.3 10/04/2021   HGBA1C 5.7 (H) 03/31/2021   HGBA1C 5.5 11/09/2020   HGBA1C 5.8 (H) 08/05/2020   HGBA1C 5.7 (H) 04/28/2020   Lab Results  Component Value Date   INSULIN 2.8 11/09/2020   INSULIN 5.1 08/05/2020   INSULIN 7.6 04/28/2020   Lab Results  Component Value Date   TSH 1.560 04/28/2020   Lab Results  Component Value Date   CHOL 117 10/16/2022   HDL 59 10/16/2022   LDLCALC 47 10/16/2022   TRIG 45 10/16/2022   CHOLHDL 2.0 10/16/2022   Lab Results  Component Value Date   VD25OH 49.0 04/17/2022   VD25OH 51.2 03/31/2021   VD25OH 76.1 11/09/2020   Lab Results  Component Value Date   WBC 8.0 10/05/2021   HGB 12.7 10/05/2021   HCT 38.6 10/05/2021  MCV 93.5 10/05/2021   PLT 228 10/05/2021   No results found for: "IRON", "TIBC", "FERRITIN"  Attestation Statements:   Reviewed by clinician on day of visit: allergies, medications, problem list, medical history, surgical history, family history, social history, and previous encounter notes.   Wilhemena Durie, am acting as Location manager for [employer], DO.  I have reviewed the above documentation for accuracy and completeness, and I agree with the above. Jearld Lesch, DO

## 2022-10-27 DIAGNOSIS — H401121 Primary open-angle glaucoma, left eye, mild stage: Secondary | ICD-10-CM | POA: Diagnosis not present

## 2022-10-27 DIAGNOSIS — H52203 Unspecified astigmatism, bilateral: Secondary | ICD-10-CM | POA: Diagnosis not present

## 2022-10-27 DIAGNOSIS — H18452 Nodular corneal degeneration, left eye: Secondary | ICD-10-CM | POA: Diagnosis not present

## 2022-10-27 DIAGNOSIS — H524 Presbyopia: Secondary | ICD-10-CM | POA: Diagnosis not present

## 2022-10-27 DIAGNOSIS — H401113 Primary open-angle glaucoma, right eye, severe stage: Secondary | ICD-10-CM | POA: Diagnosis not present

## 2022-10-31 DIAGNOSIS — H903 Sensorineural hearing loss, bilateral: Secondary | ICD-10-CM | POA: Diagnosis not present

## 2022-10-31 DIAGNOSIS — H608X3 Other otitis externa, bilateral: Secondary | ICD-10-CM | POA: Diagnosis not present

## 2022-11-21 ENCOUNTER — Ambulatory Visit: Payer: Medicare PPO | Admitting: Bariatrics

## 2022-11-21 ENCOUNTER — Encounter: Payer: Self-pay | Admitting: Bariatrics

## 2022-11-21 VITALS — BP 122/68 | HR 75 | Temp 98.1°F | Ht 63.0 in | Wt 147.0 lb

## 2022-11-21 DIAGNOSIS — Z6826 Body mass index (BMI) 26.0-26.9, adult: Secondary | ICD-10-CM | POA: Diagnosis not present

## 2022-11-21 DIAGNOSIS — E669 Obesity, unspecified: Secondary | ICD-10-CM | POA: Diagnosis not present

## 2022-11-21 DIAGNOSIS — E88819 Insulin resistance, unspecified: Secondary | ICD-10-CM | POA: Diagnosis not present

## 2022-11-21 DIAGNOSIS — E7800 Pure hypercholesterolemia, unspecified: Secondary | ICD-10-CM | POA: Diagnosis not present

## 2022-11-22 DIAGNOSIS — I480 Paroxysmal atrial fibrillation: Secondary | ICD-10-CM | POA: Diagnosis not present

## 2022-11-22 DIAGNOSIS — Z86718 Personal history of other venous thrombosis and embolism: Secondary | ICD-10-CM | POA: Diagnosis not present

## 2022-11-22 DIAGNOSIS — K625 Hemorrhage of anus and rectum: Secondary | ICD-10-CM | POA: Diagnosis not present

## 2022-11-23 ENCOUNTER — Telehealth: Payer: Self-pay | Admitting: *Deleted

## 2022-11-23 NOTE — Telephone Encounter (Signed)
   Pre-operative Risk Assessment    Patient Name: Maureen Elliott  DOB: 1945/12/26 MRN: [id number]      Request for Surgical Clearance    Procedure:   Colonoscopy  Date of Surgery:  Clearance 12/05/22                                 Surgeon:  Dr. Paulita Fujita Surgeon's Group or Practice Name:  [facility] Phone number:  [phone] Fax number:  [phone]   Type of Clearance Requested:   - Medical  - Pharmacy:  Hold Rivaroxaban (Xarelto) Not Indicated   Type of Anesthesia:  Not Indicated   Additional requests/questions:    Signed, Greer Ee   11/23/2022, 11:23 AM

## 2022-11-23 NOTE — Telephone Encounter (Signed)
   Name: Maureen Elliott  DOB: 07-Nov-1945  MRN: [id number]  Primary Cardiologist: Fransico Him, MD  Chart reviewed as part of pre-operative protocol coverage. Because of [name]'s past medical history and time since last visit, she will require a follow-up telephone visit in order to better assess preoperative cardiovascular risk.  Pre-op covering staff: - Please schedule appointment and call patient to inform them. If patient already had an upcoming appointment within acceptable timeframe, please add "pre-op clearance" to the appointment notes so provider is aware. - Please contact requesting surgeon's office via preferred method (i.e, phone, fax) to inform them of need for appointment prior to surgery.  Per office protocol, patient can hold Xarelto for 2 days prior to procedure.   Elgie Collard, PA-C  11/23/2022, 5:46 PM

## 2022-11-23 NOTE — Telephone Encounter (Signed)
Patient with diagnosis of A Fib on Xarelto for anticoagulation.    Procedure: colonoscopy Date of procedure: 12/05/22   CHA2DS2-VASc Score = 4  This indicates a 4.8% annual risk of stroke. The patient's score is based upon: CHF History: 0 HTN History: 0 Diabetes History: 0 Stroke History: 0 Vascular Disease History: 1 Age Score: 2 Gender Score: 1     CrCl 65 mL/min Platelet count overdue for CBC  Per office protocol, patient can hold Xarelto for 2 days prior to procedure.    **This guidance is not considered finalized until pre-operative APP has relayed final recommendations.**;

## 2022-11-24 ENCOUNTER — Telehealth: Payer: Self-pay | Admitting: *Deleted

## 2022-11-24 NOTE — Telephone Encounter (Signed)
Left message to call back to set up tele visit for pre op clearance.

## 2022-11-24 NOTE — Telephone Encounter (Signed)
Pt has been scheduled for tele pre op appt 11/30/22 @ 9 am. Med rec and consent are done.     Patient Consent for Virtual Visit        Maureen Elliott has provided verbal consent on 11/24/2022 for a virtual visit (video or telephone).   CONSENT FOR VIRTUAL VISIT FOR:  Maureen Elliott  By participating in this virtual visit I agree to the following:  I hereby voluntarily request, consent and authorize Maureen Elliott and its employed or contracted physicians, physician assistants, nurse practitioners or other licensed health care professionals (the Practitioner), to provide me with telemedicine health care services (the "Services") as deemed necessary by the treating Practitioner. I acknowledge and consent to receive the Services by the Practitioner via telemedicine. I understand that the telemedicine visit will involve communicating with the Practitioner through live audiovisual communication technology and the disclosure of certain medical information by electronic transmission. I acknowledge that I have been given the opportunity to request an in-person assessment or other available alternative prior to the telemedicine visit and am voluntarily participating in the telemedicine visit.  I understand that I have the right to withhold or withdraw my consent to the use of telemedicine in the course of my care at any time, without affecting my right to future care or treatment, and that the Practitioner or I may terminate the telemedicine visit at any time. I understand that I have the right to inspect all information obtained and/or recorded in the course of the telemedicine visit and may receive copies of available information for a reasonable fee.  I understand that some of the potential risks of receiving the Services via telemedicine include:  Delay or interruption in medical evaluation due to technological equipment failure or disruption; Information transmitted may not be sufficient (e.g.  poor resolution of images) to allow for appropriate medical decision making by the Practitioner; and/or  In rare instances, security protocols could fail, causing a breach of personal health information.  Furthermore, I acknowledge that it is my responsibility to provide information about my medical history, conditions and care that is complete and accurate to the best of my ability. I acknowledge that Practitioner's advice, recommendations, and/or decision may be based on factors not within their control, such as incomplete or inaccurate data provided by me or distortions of diagnostic images or specimens that may result from electronic transmissions. I understand that the practice of medicine is not an exact science and that Practitioner makes no warranties or guarantees regarding treatment outcomes. I acknowledge that a copy of this consent can be made available to me via my patient portal ([place] [facility]), or I can request a printed copy by calling the office of [facility].    I understand that my insurance will be billed for this visit.   I have read or had this consent read to me. I understand the contents of this consent, which adequately explains the benefits and risks of the Services being provided via telemedicine.  I have been provided ample opportunity to ask questions regarding this consent and the Services and have had my questions answered to my satisfaction. I give my informed consent for the services to be provided through the use of telemedicine in my medical care

## 2022-11-24 NOTE — Telephone Encounter (Signed)
Pt has been scheduled for tele pre op appt 11/30/22 @ 9 am. Med rec and consent are done.

## 2022-11-27 NOTE — Progress Notes (Unsigned)
Virtual Visit via Telephone Note   Because of Maureen Elliott's co-morbid illnesses, she is at least at moderate risk for complications without adequate follow up.  This format is felt to be most appropriate for this patient at this time.  The patient did not have access to video technology/had technical difficulties with video requiring transitioning to audio format only (telephone).  All issues noted in this document were discussed and addressed.  No physical exam could be performed with this format.  Please refer to the patient's chart for her consent to telehealth for [facility].  Evaluation Performed:  Preoperative cardiovascular risk assessment _____________   Date:  11/27/2022   Patient ID:  Maureen Elliott, DOB 03/18/1946, MRN [id number] Patient Location:  Home Provider location:   Office  Primary Care Provider:  Kathyrn Lass, MD Primary Cardiologist:  Fransico Him, MD  Chief Complaint / Patient Profile   77 y.o. y/o female with a h/o PAF on chronic anticoagulation, left lower extremity DVT, gestational diabetes, HLD, coronary CTA with minimal CAD  < 25% LAD stenosis, home sleep study with no OSA, who is pending colonoscopy and presents today for telephonic preoperative cardiovascular risk assessment.  History of Present Illness    Maureen Elliott is a 77 y.o. female who presents via audio/video conferencing for a telehealth visit today.  Pt was last seen in cardiology clinic on 05/02/22 by Dr. Radford Pax. At that time Maureen Elliott was doing well.  The patient is now pending procedure as outlined above. Since her last visit, she denies chest pain, shortness of breath, lower extremity edema, fatigue, palpitations, melena, hematuria, hemoptysis, diaphoresis, weakness, presyncope, syncope, orthopnea, and PND. She is actively participating in [employer] and walks around the track at the gym for additional exercise without any concerning cardiac symptoms.   Past  Medical History    Past Medical History:  Diagnosis Date   CAD (coronary artery disease), native coronary artery    minimal < 25% plaque LAD by coronary CTA 0000000   Complication of anesthesia    be careful with intubation due to previous cervical neck surgery   Cystocele    Diverticulitis    Diverticulosis    DVT (deep venous thrombosis) (HCC)    left leg   Gallbladder problem    Gestational diabetes    Glaucoma    Hemorrhoids internal and external   High cholesterol    History of gestational diabetes yrs ago   Joint pain    Lactose intolerance    Nocturia    Osteoarthritis    PAF (paroxysmal atrial fibrillation) (HCC)    Partial hearing loss    PONV (postoperative nausea and vomiting) 2013   n/v after phenergan, injectable phenergan helps with nausea   Prediabetes    Past Surgical History:  Procedure Laterality Date   ABDOMINAL HYSTERECTOMY  2013   ovaries removed    BLADDER SURGERY     BREAST BIOPSY Bilateral 2001   cervical fusion C 5, C 6 and C 7  2004   colonscopy  feb 2015   blood in stool, just was hemorrhoids   CYSTOCELE REPAIR N/A 01/13/2014   Procedure: CYSTOSCOPY, REPAIR CYSTOCELE VAULT PROLAPSE/GRAFT;  Surgeon: Reece Packer, MD;  Location: [facility];  Service: Urology;  Laterality: N/A;   EYE SURGERY Bilateral 8 yrs ago   lens replacement for cataract   GLAUCOMA SURGERY     SEPTOPLASTY Bilateral 03/01/2020   Procedure: NASAL SEPTOPLASTY;  Surgeon: Leta Baptist,  MD;  Location: [facility];  Service: ENT;  Laterality: Bilateral;   tummy tuck with mesh  6 yrs ago   large abdominal is present    Allergies  Allergies  Allergen Reactions   Amoxicillin Diarrhea    Normal adverse effect   Attapulgite Diarrhea   Caffeine Other (See Comments)    Avoid due to glaucoma   Dorzolamide Other (See Comments)    Severe eye irritation   Fish Oil     Other reaction(s): due to xarelto   Keflex [Cephalexin] Diarrhea    Normal adverse effect   Levaquin  [Levofloxacin] Other (See Comments)    Joint pain and stiffness   Maxitrol [Neomycin-Polymyxin-Dexameth] Other (See Comments)    Burns eyes   Methotrexate Derivatives Other (See Comments)    Hearing damage   Nsaids     Other reaction(s): due to being on Xarelto   Prednisone Other (See Comments)    Avoid due to glaucoma   Restasis [Cyclosporine] Other (See Comments)    Burns eyes   Rofecoxib     Other reaction(s): Heart palpitations / PVC's   Valtrex [Valacyclovir] Swelling and Other (See Comments)    Face swelling, headache on left side, increased eye pressure    Home Medications    Prior to Admission medications   Medication Sig Start Date End Date Taking? Authorizing Provider  acetaminophen (TYLENOL) 325 MG tablet Take 650 mg by mouth every 6 (six) hours as needed for mild pain, fever or headache.    [provider]  Artificial Tear Solution (GENTEAL TEARS) 0.1-0.2-0.3 % SOLN Apply to eye as needed.    [provider]  atorvastatin (LIPITOR) 40 MG tablet Take 1 tablet (40 mg total) by mouth daily. 06/20/22   Sueanne Margarita, MD  brimonidine (ALPHAGAN) 0.2 % ophthalmic solution Place 1 drop into both eyes 3 (three) times daily.    [provider]  CALCIUM 600 1500 (600 Ca) MG TABS tablet SMARTSIG:1 Tablet(s) By Mouth 10/17/21   [provider]  Cholecalciferol (VITAMIN D) 50 MCG (2000 UT) CAPS Take 1 capsule (2,000 Units total) by mouth daily. 03/31/21   Whitmire, Joneen Boers, FNP  CHOLESTOFF PLUS 450 MG CAPS SMARTSIG:1 Capsule(s) By Mouth 10/17/21   [provider]  conjugated estrogens (PREMARIN) vaginal cream Place 1 Applicatorful vaginally 2 (two) times a week.    [provider]  ezetimibe (ZETIA) 10 MG tablet Take 1 tablet (10 mg total) by mouth daily. 08/21/22   Sueanne Margarita, MD  ipratropium (ATROVENT) 0.06 % nasal spray Place 2 sprays into both nostrils in the morning and at bedtime. 05/02/22   [provider]  melatonin 5  MG TABS Take 5 mg by mouth at bedtime.    [provider]  Multiple Vitamin (MULTIVITAMIN WITH MINERALS) TABS tablet Take 1 tablet by mouth daily.    [provider]  pilocarpine (PILOCAR) 1 % ophthalmic solution Place 1 drop into both eyes 3 (three) times daily. 04/10/20   [provider]  timolol (TIMOPTIC) 0.5 % ophthalmic solution Place 1 drop into both eyes 2 (two) times daily. 01/24/21   [provider]  Tretinoin 0.075 % CREA Apply 1 application topically daily. To prevent  nasal warts    [provider]  XARELTO 20 MG TABS tablet TAKE 1 TABLET BY MOUTH EVERY DAY 09/11/22   Sueanne Margarita, MD    Physical Exam    Vital Signs:  Maureen Elliott does not  have vital signs available for review today.  Given telephonic nature of communication, physical exam is limited. AAOx3. NAD. Normal affect.  Speech and respirations are unlabored.  Accessory Clinical Findings    None  Assessment & Plan    1.  Preoperative Cardiovascular Risk Assessment:The patient is doing well from a cardiac perspective. Therefore, based on ACC/AHA guidelines, the patient would be at acceptable risk for the planned procedure without further cardiovascular testing. According to the Revised Cardiac Risk Index (RCRI), her Perioperative Risk of Major Cardiac Event is (%): 0.4. Her Functional Capacity in METs is: 7.04 according to the Duke Activity Status Index (DASI).  The patient was advised that if she develops new symptoms prior to surgery to contact our office to arrange for a follow-up visit, and she verbalized understanding.  Per office protocol, patient can hold Xarelto for 2 days prior to procedure.     A copy of this note will be routed to requesting surgeon.  Time:   Today, I have spent 7 minutes with the patient with telehealth technology discussing medical history, symptoms, and management plan.     Emmaline Life, NP-C  11/30/2022, 9:00 AM [address] Office [phone] Fax [phone]

## 2022-11-30 ENCOUNTER — Encounter: Payer: Self-pay | Admitting: Nurse Practitioner

## 2022-11-30 ENCOUNTER — Ambulatory Visit: Payer: Medicare PPO | Attending: Cardiovascular Disease | Admitting: Nurse Practitioner

## 2022-11-30 DIAGNOSIS — Z01810 Encounter for preprocedural cardiovascular examination: Secondary | ICD-10-CM

## 2022-12-05 ENCOUNTER — Encounter: Payer: Self-pay | Admitting: Bariatrics

## 2022-12-05 DIAGNOSIS — K921 Melena: Secondary | ICD-10-CM | POA: Diagnosis not present

## 2022-12-05 DIAGNOSIS — K5730 Diverticulosis of large intestine without perforation or abscess without bleeding: Secondary | ICD-10-CM | POA: Diagnosis not present

## 2022-12-05 DIAGNOSIS — D123 Benign neoplasm of transverse colon: Secondary | ICD-10-CM | POA: Diagnosis not present

## 2022-12-05 DIAGNOSIS — K648 Other hemorrhoids: Secondary | ICD-10-CM | POA: Diagnosis not present

## 2022-12-05 DIAGNOSIS — D120 Benign neoplasm of cecum: Secondary | ICD-10-CM | POA: Diagnosis not present

## 2022-12-05 NOTE — Progress Notes (Signed)
Chief Complaint:   OBESITY Maureen Elliott is here to discuss her progress with her obesity treatment plan along with follow-up of her obesity related diagnoses. Maureen Elliott is on the Category 3 plan and states she is working on weight maintenance. Maureen Elliott states she is exercising with [employer] sne[employer] plan 45 minutes 3 times per week.  Today's visit was #: 33 Starting weight: 171 lbs Starting date: 04/28/20 Today's weight: 147 lbs Today's date: 11/21/22 Total lbs lost to date: 24 Total lbs lost since last in-office visit: +1  Interim History: She is up 1 pound since her last visit.  According to the bioimpedance scale her muscle mass is up 1 pound.  She has not been following her diet as closely.  Subjective:   1. Insulin resistance No medications.  2. Pure hypercholesterolemia Taking Lipitor and Zetia.   Assessment/Plan:   1. Insulin resistance 1.  Minimize carbohydrates.  2. Pure hypercholesterolemia 1.  Continue Lipitor and Zetia. 2.  Will minimize saturated fat and increase MUFAs and PUFAs.  3. Generalized obesity BMI 26.0-26.9,adult 1.  Will adhere to the plan 80-90%. 2.  Meal planning.  Maureen Elliott is currently in the action stage of change. As such, her goal is to continue with weight loss efforts. She has agreed to the Category 3 plan.  Exercise goals:  as is  Behavioral modification strategies: increasing lean protein intake, decreasing simple carbohydrates, increasing vegetables, increasing water intake, decreasing eating out, no skipping meals, meal planning and cooking strategies, keeping healthy foods in the home, and planning for success.  Maureen Elliott has agreed to follow-up with our clinic in 4 weeks. She was informed of the importance of frequent follow-up visits to maximize her success with intensive lifestyle modifications for her multiple health conditions.   Objective:   Blood pressure 122/68, pulse 75, temperature 98.1 F (36.7 C), height '5\' 3"'$  (1.6 m), weight 147 lb  (66.7 kg), SpO2 98 %. Body mass index is 26.04 kg/m.  General: Cooperative, alert, well developed, in no acute distress. HEENT: Conjunctivae and lids unremarkable. Cardiovascular: Regular rhythm.  Lungs: Normal work of breathing. Neurologic: No focal deficits.   Lab Results  Component Value Date   CREATININE 0.78 04/17/2022   BUN 18 04/17/2022   NA 142 04/17/2022   K 4.1 04/17/2022   CL 103 04/17/2022   CO2 25 04/17/2022   Lab Results  Component Value Date   ALT 23 10/16/2022   AST 20 04/17/2022   ALKPHOS 68 04/17/2022   BILITOT 0.4 04/17/2022   Lab Results  Component Value Date   HGBA1C 5.3 10/04/2021   HGBA1C 5.7 (H) 03/31/2021   HGBA1C 5.5 11/09/2020   HGBA1C 5.8 (H) 08/05/2020   HGBA1C 5.7 (H) 04/28/2020   Lab Results  Component Value Date   INSULIN 2.8 11/09/2020   INSULIN 5.1 08/05/2020   INSULIN 7.6 04/28/2020   Lab Results  Component Value Date   TSH 1.560 04/28/2020   Lab Results  Component Value Date   CHOL 117 10/16/2022   HDL 59 10/16/2022   LDLCALC 47 10/16/2022   TRIG 45 10/16/2022   CHOLHDL 2.0 10/16/2022   Lab Results  Component Value Date   VD25OH 49.0 04/17/2022   VD25OH 51.2 03/31/2021   VD25OH 76.1 11/09/2020   Lab Results  Component Value Date   WBC 8.0 10/05/2021   HGB 12.7 10/05/2021   HCT 38.6 10/05/2021   MCV 93.5 10/05/2021   PLT 228 10/05/2021   No results found for: "IRON", "TIBC", "  FERRITIN"  Attestation Statements:   Reviewed by clinician on day of visit: allergies, medications, problem list, medical history, surgical history, family history, social history, and previous encounter notes.  I, Dawn Whitmire, FNP-C, am acting as transcriptionist for Dr. Jearld Lesch.  I have reviewed the above documentation for accuracy and completeness, and I agree with the above. Jearld Lesch, DO

## 2022-12-07 DIAGNOSIS — K921 Melena: Secondary | ICD-10-CM | POA: Diagnosis not present

## 2022-12-07 DIAGNOSIS — K648 Other hemorrhoids: Secondary | ICD-10-CM | POA: Diagnosis not present

## 2022-12-07 DIAGNOSIS — D123 Benign neoplasm of transverse colon: Secondary | ICD-10-CM | POA: Diagnosis not present

## 2022-12-07 DIAGNOSIS — D120 Benign neoplasm of cecum: Secondary | ICD-10-CM | POA: Diagnosis not present

## 2022-12-07 DIAGNOSIS — K5730 Diverticulosis of large intestine without perforation or abscess without bleeding: Secondary | ICD-10-CM | POA: Diagnosis not present

## 2022-12-12 DIAGNOSIS — D123 Benign neoplasm of transverse colon: Secondary | ICD-10-CM | POA: Diagnosis not present

## 2022-12-12 DIAGNOSIS — D120 Benign neoplasm of cecum: Secondary | ICD-10-CM | POA: Diagnosis not present

## 2022-12-19 ENCOUNTER — Encounter: Payer: Self-pay | Admitting: Bariatrics

## 2022-12-19 ENCOUNTER — Ambulatory Visit: Payer: Medicare PPO | Admitting: Bariatrics

## 2022-12-19 VITALS — BP 121/74 | HR 75 | Temp 98.5°F | Ht 63.0 in | Wt 146.0 lb

## 2022-12-19 DIAGNOSIS — E7800 Pure hypercholesterolemia, unspecified: Secondary | ICD-10-CM | POA: Diagnosis not present

## 2022-12-19 DIAGNOSIS — E669 Obesity, unspecified: Secondary | ICD-10-CM

## 2022-12-19 DIAGNOSIS — Z6826 Body mass index (BMI) 26.0-26.9, adult: Secondary | ICD-10-CM | POA: Diagnosis not present

## 2022-12-20 NOTE — Progress Notes (Unsigned)
Chief Complaint:   OBESITY Maureen Elliott is here to discuss her progress with her obesity treatment plan along with follow-up of her obesity related diagnoses. Maureen Elliott is on the Category 3 Plan and states she is following her eating plan approximately (unknown)% of the time. Maureen Elliott states she is at the [facility] for 45 minutes 4 times per week.  Today's visit was #: 76 Starting weight: 171 lbs Starting date: 04/28/2020 Today's weight: 146 lbs Today's date: 12/19/2022 Total lbs lost to date: 25 Total lbs lost since last in-office visit: 1  Interim History: Maureen Elliott is down 1 additional pound since her last visit.   Subjective:   1. Pure hypercholesterolemia Maureen Elliott is taking Lipitor.   Assessment/Plan:   1. Pure hypercholesterolemia Maureen Elliott will continue Lipitor as directed.   2. Generalized obesity  3. BMI 26.0-26.9,adult Maureen Elliott is currently in the action stage of change. As such, her goal is to continue with weight loss efforts. She has agreed to the Category 3 Plan.   Meal planning and intentional eating was discussed.   Exercise goals: As is.   Behavioral modification strategies: increasing lean protein intake, decreasing simple carbohydrates, increasing vegetables, increasing water intake, decreasing eating out, no skipping meals, meal planning and cooking strategies, keeping healthy foods in the home, and planning for success.  Maureen Elliott has agreed to follow-up with our clinic in 6 weeks. She was informed of the importance of frequent follow-up visits to maximize her success with intensive lifestyle modifications for her multiple health conditions.   Objective:   Blood pressure 121/74, pulse 75, temperature 98.5 F (36.9 C), height 5\' 3"  (1.6 m), weight 146 lb (66.2 kg), SpO2 97 %. Body mass index is 25.86 kg/m.  General: Cooperative, alert, well developed, in no acute distress. HEENT: Conjunctivae and lids unremarkable. Cardiovascular: Regular rhythm.  Lungs: Normal work of  breathing. Neurologic: No focal deficits.   Lab Results  Component Value Date   CREATININE 0.78 04/17/2022   BUN 18 04/17/2022   NA 142 04/17/2022   K 4.1 04/17/2022   CL 103 04/17/2022   CO2 25 04/17/2022   Lab Results  Component Value Date   ALT 23 10/16/2022   AST 20 04/17/2022   ALKPHOS 68 04/17/2022   BILITOT 0.4 04/17/2022   Lab Results  Component Value Date   HGBA1C 5.3 10/04/2021   HGBA1C 5.7 (H) 03/31/2021   HGBA1C 5.5 11/09/2020   HGBA1C 5.8 (H) 08/05/2020   HGBA1C 5.7 (H) 04/28/2020   Lab Results  Component Value Date   INSULIN 2.8 11/09/2020   INSULIN 5.1 08/05/2020   INSULIN 7.6 04/28/2020   Lab Results  Component Value Date   TSH 1.560 04/28/2020   Lab Results  Component Value Date   CHOL 117 10/16/2022   HDL 59 10/16/2022   LDLCALC 47 10/16/2022   TRIG 45 10/16/2022   CHOLHDL 2.0 10/16/2022   Lab Results  Component Value Date   VD25OH 49.0 04/17/2022   VD25OH 51.2 03/31/2021   VD25OH 76.1 11/09/2020   Lab Results  Component Value Date   WBC 8.0 10/05/2021   HGB 12.7 10/05/2021   HCT 38.6 10/05/2021   MCV 93.5 10/05/2021   PLT 228 10/05/2021   No results found for: "IRON", "TIBC", "FERRITIN"  Attestation Statements:   Reviewed by clinician on day of visit: allergies, medications, problem list, medical history, surgical history, family history, social history, and previous encounter notes.   Wilhemena Durie, am acting as Location manager for [employer], DO.  I have reviewed the above documentation for accuracy and completeness, and I agree with the above. Jearld Lesch, DO

## 2022-12-21 ENCOUNTER — Encounter: Payer: Self-pay | Admitting: Bariatrics

## 2023-01-11 DIAGNOSIS — H401113 Primary open-angle glaucoma, right eye, severe stage: Secondary | ICD-10-CM | POA: Diagnosis not present

## 2023-01-11 DIAGNOSIS — H401121 Primary open-angle glaucoma, left eye, mild stage: Secondary | ICD-10-CM | POA: Diagnosis not present

## 2023-01-11 DIAGNOSIS — H18452 Nodular corneal degeneration, left eye: Secondary | ICD-10-CM | POA: Diagnosis not present

## 2023-01-22 DIAGNOSIS — Z0000 Encounter for general adult medical examination without abnormal findings: Secondary | ICD-10-CM | POA: Diagnosis not present

## 2023-01-22 DIAGNOSIS — Z6826 Body mass index (BMI) 26.0-26.9, adult: Secondary | ICD-10-CM | POA: Diagnosis not present

## 2023-01-22 DIAGNOSIS — D6869 Other thrombophilia: Secondary | ICD-10-CM | POA: Diagnosis not present

## 2023-01-22 DIAGNOSIS — I2510 Atherosclerotic heart disease of native coronary artery without angina pectoris: Secondary | ICD-10-CM | POA: Diagnosis not present

## 2023-01-22 DIAGNOSIS — I700 Atherosclerosis of aorta: Secondary | ICD-10-CM | POA: Diagnosis not present

## 2023-01-22 DIAGNOSIS — M8580 Other specified disorders of bone density and structure, unspecified site: Secondary | ICD-10-CM | POA: Diagnosis not present

## 2023-01-22 DIAGNOSIS — E042 Nontoxic multinodular goiter: Secondary | ICD-10-CM | POA: Diagnosis not present

## 2023-01-22 DIAGNOSIS — I4891 Unspecified atrial fibrillation: Secondary | ICD-10-CM | POA: Diagnosis not present

## 2023-01-22 DIAGNOSIS — N390 Urinary tract infection, site not specified: Secondary | ICD-10-CM | POA: Diagnosis not present

## 2023-01-30 ENCOUNTER — Ambulatory Visit: Payer: Medicare PPO | Admitting: Bariatrics

## 2023-01-30 ENCOUNTER — Encounter: Payer: Self-pay | Admitting: Bariatrics

## 2023-01-30 VITALS — BP 111/67 | HR 77 | Temp 97.7°F | Ht 63.0 in | Wt 145.0 lb

## 2023-01-30 DIAGNOSIS — Z6825 Body mass index (BMI) 25.0-25.9, adult: Secondary | ICD-10-CM

## 2023-01-30 DIAGNOSIS — E669 Obesity, unspecified: Secondary | ICD-10-CM

## 2023-01-30 DIAGNOSIS — E7800 Pure hypercholesterolemia, unspecified: Secondary | ICD-10-CM | POA: Diagnosis not present

## 2023-01-31 NOTE — Progress Notes (Unsigned)
Chief Complaint:   OBESITY Maureen Elliott is here to discuss her progress with her obesity treatment plan along with follow-up of her obesity related diagnoses. Maureen Elliott is on the Category 3 Plan and states she is following her eating plan approximately (unknown)% of the time. [name] states she is doing Geophysicist/field seismologist for 45 minutes 4 times per week.  Today's visit was #: 34 Starting weight: 171 lbs Starting date: 04/28/2020 Today's weight: 145 lbs Today's date: 01/30/2023 Total lbs lost to date: 26 Total lbs lost since last in-office visit: 1  Interim History: Maureen Elliott is down 1 lb since her last visit. She is at or near her goal weight. She is careful about what she eats.   Subjective:   1. Hypercholesterolemia Maureen Elliott is taking Zetia and Lipitor.   Assessment/Plan:   1. Hypercholesterolemia Maureen Elliott will continue her medications. She will eliminate trans fats, and keep healthy fats high. Handout on healthy and unhealthy snacks was given.   2. Generalized obesity  3. BMI 25.0-25.9,adult Maureen Elliott is currently in the action stage of change. As such, her goal is to continue with weight loss efforts. She has agreed to the Category 3 Plan.   She will keep protein, water, and fiber high. Reviewed labs with the patient today CMP and TSH. Keep vegetables high.   Exercise goals: As is.   Behavioral modification strategies: increasing lean protein intake, decreasing simple carbohydrates, increasing vegetables, increasing water intake, decreasing eating out, no skipping meals, meal planning and cooking strategies, keeping healthy foods in the home, and planning for success.  Maureen Elliott has agreed to follow-up with our clinic in 6 weeks. She was informed of the importance of frequent follow-up visits to maximize her success with intensive lifestyle modifications for her multiple health conditions.   Objective:   Blood pressure 111/67, pulse 77, temperature 97.7 F (36.5 C), height 5\' 3"  (1.6 m), weight 145 lb  (65.8 kg), SpO2 97 %. Body mass index is 25.69 kg/m.  General: Cooperative, alert, well developed, in no acute distress. HEENT: Conjunctivae and lids unremarkable. Cardiovascular: Regular rhythm.  Lungs: Normal work of breathing. Neurologic: No focal deficits.   Lab Results  Component Value Date   CREATININE 0.78 04/17/2022   BUN 18 04/17/2022   NA 142 04/17/2022   K 4.1 04/17/2022   CL 103 04/17/2022   CO2 25 04/17/2022   Lab Results  Component Value Date   ALT 23 10/16/2022   AST 20 04/17/2022   ALKPHOS 68 04/17/2022   BILITOT 0.4 04/17/2022   Lab Results  Component Value Date   HGBA1C 5.3 10/04/2021   HGBA1C 5.7 (H) 03/31/2021   HGBA1C 5.5 11/09/2020   HGBA1C 5.8 (H) 08/05/2020   HGBA1C 5.7 (H) 04/28/2020   Lab Results  Component Value Date   INSULIN 2.8 11/09/2020   INSULIN 5.1 08/05/2020   INSULIN 7.6 04/28/2020   Lab Results  Component Value Date   TSH 1.560 04/28/2020   Lab Results  Component Value Date   CHOL 117 10/16/2022   HDL 59 10/16/2022   LDLCALC 47 10/16/2022   TRIG 45 10/16/2022   CHOLHDL 2.0 10/16/2022   Lab Results  Component Value Date   VD25OH 49.0 04/17/2022   VD25OH 51.2 03/31/2021   VD25OH 76.1 11/09/2020   Lab Results  Component Value Date   WBC 8.0 10/05/2021   HGB 12.7 10/05/2021   HCT 38.6 10/05/2021   MCV 93.5 10/05/2021   PLT 228 10/05/2021   No results found for: "  IRON", "TIBC", "FERRITIN"  Attestation Statements:   Reviewed by clinician on day of visit: allergies, medications, problem list, medical history, surgical history, family history, social history, and previous encounter notes.   Trude Mcburney, am acting as Energy manager for [employer], DO.  I have reviewed the above documentation for accuracy and completeness, and I agree with the above. Corinna Capra, DO

## 2023-02-01 ENCOUNTER — Encounter: Payer: Self-pay | Admitting: Bariatrics

## 2023-02-21 DIAGNOSIS — H18452 Nodular corneal degeneration, left eye: Secondary | ICD-10-CM | POA: Diagnosis not present

## 2023-02-21 DIAGNOSIS — H401121 Primary open-angle glaucoma, left eye, mild stage: Secondary | ICD-10-CM | POA: Diagnosis not present

## 2023-02-21 DIAGNOSIS — H401113 Primary open-angle glaucoma, right eye, severe stage: Secondary | ICD-10-CM | POA: Diagnosis not present

## 2023-03-05 ENCOUNTER — Other Ambulatory Visit: Payer: Self-pay | Admitting: Pain Medicine

## 2023-03-05 ENCOUNTER — Ambulatory Visit
Admission: RE | Admit: 2023-03-05 | Discharge: 2023-03-05 | Disposition: A | Discharge status: Home / Self Care | Payer: Medicare PPO | Source: Ambulatory Visit | Attending: Pain Medicine | Admitting: Pain Medicine

## 2023-03-05 DIAGNOSIS — Z6827 Body mass index (BMI) 27.0-27.9, adult: Secondary | ICD-10-CM | POA: Diagnosis not present

## 2023-03-05 DIAGNOSIS — M545 Low back pain, unspecified: Secondary | ICD-10-CM

## 2023-03-07 ENCOUNTER — Ambulatory Visit: Payer: Medicare PPO | Admitting: Bariatrics

## 2023-03-07 ENCOUNTER — Encounter: Payer: Self-pay | Admitting: Bariatrics

## 2023-03-07 VITALS — BP 148/77 | HR 74 | Temp 98.0°F | Ht 63.0 in | Wt 148.0 lb

## 2023-03-07 DIAGNOSIS — E7849 Other hyperlipidemia: Secondary | ICD-10-CM | POA: Diagnosis not present

## 2023-03-07 DIAGNOSIS — Z6826 Body mass index (BMI) 26.0-26.9, adult: Secondary | ICD-10-CM

## 2023-03-07 DIAGNOSIS — E669 Obesity, unspecified: Secondary | ICD-10-CM

## 2023-03-07 NOTE — Progress Notes (Signed)
   WEIGHT SUMMARY AND BIOMETRICS  Weight Gained Since Last Visit: 3lb   Vitals Temp: 98 F (36.7 C) BP: (!) 148/77 Pulse Rate: 74 SpO2: 98 %   Anthropometric Measurements Height: 5\' 3"  (1.6 m) Weight: 148 lb (67.1 kg) BMI (Calculated): 26.22 Weight at Last Visit: 145lb Weight Gained Since Last Visit: 3lb Starting Weight: 171lb Total Weight Loss (lbs): 23 lb (10.4 kg)   Body Composition  Body Fat %: 38.8 % Fat Mass (lbs): 57.6 lbs Muscle Mass (lbs): 86.2 lbs Total Body Water (lbs): 62.8 lbs Visceral Fat Rating : 11   Other Clinical Data Fasting: no Labs: no Today's Visit #: 35 Starting Date: 04/28/20    OBESITY Maureen Elliott is here to discuss her progress with her obesity treatment plan along with follow-up of her obesity related diagnoses.   Nutrition Plan: the Category 3 plan - 0% adherence.  Current exercise: none  Interim History:  She is up 3 lbs since her last visit.  Eating all of the food on the plan., Is skipping meals, and Water intake is adequate.  Hunger is moderately controlled.  Cravings are moderately controlled.  Assessment/Plan:   Hyperlipidemia LDL is at goal. Medication(s): Zetia Cardiovascular risk factors: advanced age (older than 32 for men, 18 for women) and dyslipidemia  Lab Results  Component Value Date   CHOL 117 10/16/2022   HDL 59 10/16/2022   LDLCALC 47 10/16/2022   TRIG 45 10/16/2022   CHOLHDL 2.0 10/16/2022   Lab Results  Component Value Date   ALT 23 10/16/2022   AST 20 04/17/2022   ALKPHOS 68 04/17/2022   BILITOT 0.4 04/17/2022   The ASCVD Risk score (Arnett DK, et al., 2019) failed to calculate for the following reasons:   The valid total cholesterol range is 130 to 320 mg/dL  Plan:  Continue statin.  Will avoid all trans fats.  Will read labels Will minimize saturated fats except the following: low fat meats in moderation, diary, and limited dark chocolate.  Increase Omega 3 in foods, and consider  an Omega 3 supplement.      Generalized Obesity: Current BMI BMI (Calculated): 26.22    Illyana is currently in the action stage of change. As such, her goal is to maintain weight for now.  She has agreed to the Category 3 plan.  Exercise goals: Older adults should determine their level of effort for physical activity relative to their level of fitness.   Behavioral modification strategies: meal planning , increase water intake, better snacking choices, decrease junk food, and mindful eating.  Katlynn has agreed to follow-up with our clinic in 6 weeks.       Objective:   VITALS: Per patient if applicable, see vitals. GENERAL: Alert and in no acute distress. CARDIOPULMONARY: No increased WOB. Speaking in clear sentences.  PSYCH: Pleasant and cooperative. Speech normal rate and rhythm. Affect is appropriate. Insight and judgement are appropriate. Attention is focused, linear, and appropriate.  NEURO: Oriented as arrived to appointment on time with no prompting.   Attestation Statements:     This was prepared with the assistance of Engineer, civil (consulting).  Occasional wrong-word or sound-a-like substitutions may have occurred due to the inherent limitations of voice recognition software.   Corinna Capra, DO

## 2023-03-12 DIAGNOSIS — S0990XA Unspecified injury of head, initial encounter: Secondary | ICD-10-CM | POA: Diagnosis not present

## 2023-03-12 DIAGNOSIS — S0083XA Contusion of other part of head, initial encounter: Secondary | ICD-10-CM | POA: Diagnosis not present

## 2023-03-12 DIAGNOSIS — W19XXXA Unspecified fall, initial encounter: Secondary | ICD-10-CM | POA: Diagnosis not present

## 2023-03-12 DIAGNOSIS — S0993XA Unspecified injury of face, initial encounter: Secondary | ICD-10-CM | POA: Diagnosis not present

## 2023-03-12 DIAGNOSIS — W1830XA Fall on same level, unspecified, initial encounter: Secondary | ICD-10-CM | POA: Diagnosis not present

## 2023-03-12 DIAGNOSIS — S0181XA Laceration without foreign body of other part of head, initial encounter: Secondary | ICD-10-CM | POA: Diagnosis not present

## 2023-03-12 DIAGNOSIS — S199XXA Unspecified injury of neck, initial encounter: Secondary | ICD-10-CM | POA: Diagnosis not present

## 2023-03-12 DIAGNOSIS — R519 Headache, unspecified: Secondary | ICD-10-CM | POA: Diagnosis not present

## 2023-03-12 DIAGNOSIS — E042 Nontoxic multinodular goiter: Secondary | ICD-10-CM | POA: Diagnosis not present

## 2023-03-12 DIAGNOSIS — Z981 Arthrodesis status: Secondary | ICD-10-CM | POA: Diagnosis not present

## 2023-03-12 DIAGNOSIS — Z79899 Other long term (current) drug therapy: Secondary | ICD-10-CM | POA: Diagnosis not present

## 2023-03-12 DIAGNOSIS — M25531 Pain in right wrist: Secondary | ICD-10-CM | POA: Diagnosis not present

## 2023-03-12 DIAGNOSIS — M899 Disorder of bone, unspecified: Secondary | ICD-10-CM | POA: Diagnosis not present

## 2023-03-12 DIAGNOSIS — S6991XA Unspecified injury of right wrist, hand and finger(s), initial encounter: Secondary | ICD-10-CM | POA: Diagnosis not present

## 2023-03-16 DIAGNOSIS — Z7901 Long term (current) use of anticoagulants: Secondary | ICD-10-CM | POA: Diagnosis not present

## 2023-03-16 DIAGNOSIS — Z4802 Encounter for removal of sutures: Secondary | ICD-10-CM | POA: Diagnosis not present

## 2023-03-19 ENCOUNTER — Other Ambulatory Visit: Payer: Self-pay | Admitting: Family Medicine

## 2023-03-19 ENCOUNTER — Ambulatory Visit
Admission: RE | Admit: 2023-03-19 | Discharge: 2023-03-19 | Disposition: A | Discharge status: Home / Self Care | Payer: Medicare PPO | Source: Ambulatory Visit | Attending: Family Medicine | Admitting: Family Medicine

## 2023-03-19 DIAGNOSIS — M79641 Pain in right hand: Secondary | ICD-10-CM

## 2023-03-19 DIAGNOSIS — M25531 Pain in right wrist: Secondary | ICD-10-CM

## 2023-03-19 DIAGNOSIS — Z9181 History of falling: Secondary | ICD-10-CM | POA: Diagnosis not present

## 2023-03-19 DIAGNOSIS — Z6826 Body mass index (BMI) 26.0-26.9, adult: Secondary | ICD-10-CM | POA: Diagnosis not present

## 2023-03-20 DIAGNOSIS — W010XXA Fall on same level from slipping, tripping and stumbling without subsequent striking against object, initial encounter: Secondary | ICD-10-CM | POA: Diagnosis not present

## 2023-03-20 DIAGNOSIS — S02620A Fracture of subcondylar process of mandible, unspecified side, initial encounter for closed fracture: Secondary | ICD-10-CM | POA: Diagnosis not present

## 2023-03-21 ENCOUNTER — Other Ambulatory Visit: Payer: Self-pay | Admitting: Family Medicine

## 2023-03-21 DIAGNOSIS — Z1231 Encounter for screening mammogram for malignant neoplasm of breast: Secondary | ICD-10-CM

## 2023-03-23 DIAGNOSIS — S0511XA Contusion of eyeball and orbital tissues, right eye, initial encounter: Secondary | ICD-10-CM | POA: Diagnosis not present

## 2023-03-23 DIAGNOSIS — H401113 Primary open-angle glaucoma, right eye, severe stage: Secondary | ICD-10-CM | POA: Diagnosis not present

## 2023-03-23 DIAGNOSIS — H401121 Primary open-angle glaucoma, left eye, mild stage: Secondary | ICD-10-CM | POA: Diagnosis not present

## 2023-03-25 ENCOUNTER — Other Ambulatory Visit: Payer: Self-pay | Admitting: Cardiology

## 2023-03-26 ENCOUNTER — Encounter: Payer: Self-pay | Admitting: Surgical

## 2023-03-26 ENCOUNTER — Other Ambulatory Visit: Payer: Self-pay

## 2023-03-26 ENCOUNTER — Ambulatory Visit: Payer: Medicare PPO | Admitting: Surgical

## 2023-03-26 DIAGNOSIS — M79631 Pain in right forearm: Secondary | ICD-10-CM

## 2023-03-26 DIAGNOSIS — M25531 Pain in right wrist: Secondary | ICD-10-CM | POA: Diagnosis not present

## 2023-03-26 NOTE — Telephone Encounter (Signed)
Prescription refill request for Xarelto received.  Indication:afib Last office visit:3/24 Weight:67.1  kg Age:77 Scr:0.78  7/23 CrCl:65  ml/min  Prescription refilled

## 2023-03-28 ENCOUNTER — Telehealth: Payer: Self-pay | Admitting: Surgical

## 2023-03-28 ENCOUNTER — Ambulatory Visit: Payer: Medicare PPO | Admitting: Orthopaedic Surgery

## 2023-03-28 DIAGNOSIS — M79631 Pain in right forearm: Secondary | ICD-10-CM

## 2023-03-28 NOTE — Telephone Encounter (Signed)
I called and talked to the pt. She states it needs to be of her whole forearm. MRI ordered

## 2023-03-28 NOTE — Telephone Encounter (Signed)
Patient called. Says her arm is not included on the MRI. Also it was not put in as state. Her call back number is [phone]

## 2023-03-28 NOTE — Telephone Encounter (Signed)
We talked about wrist MRI as the best option but we can change to forearm MRI if she wants to. Looks like it's getting done on the 7th which is pretty good but you can change it to STAT so they read it faster if she wants that

## 2023-03-30 ENCOUNTER — Encounter: Payer: Self-pay | Admitting: Surgical

## 2023-03-30 NOTE — Progress Notes (Signed)
Office Visit Note   Patient: Maureen Elliott           Date of Birth: 05/14/1946           MRN: [id number] Visit Date: 03/26/2023 Requested by: Sigmund Hazel, MD [address] [place],  [place] [address] PCP: Sigmund Hazel, MD  Subjective: Chief Complaint  Patient presents with   Right Wrist - Pain    HPI: Maureen Elliott is a 77 y.o. female who presents to the office reporting right wrist pain.  Patient states that she fell about 2 weeks ago in the [employer].  She fell on her extended arm with such force that she has sustained a jaw fracture and had significant swelling and ecchymosis around her face that is still present in some degree.  She describes pain in the wrist as well as the midportion of the right forearm.  She had radiographs at [facility] imaging of her wrist that showed no evidence of fracture.  Does have some occasional numbness and tingling in her fourth and fifth fingers in the right hand that is new but this is just been in the last 24 hours.  She is right-hand dominant.  Taking Tylenol for pain control.  No new mechanical symptoms in the wrist.  She has history of taking Xarelto which she is compliant with..                ROS: All systems reviewed are negative as they relate to the chief complaint within the history of present illness.  Patient denies fevers or chills.  Assessment & Plan: Visit Diagnoses:  1. Right forearm pain   2. Pain in right wrist     Plan: Plan is with negative radiographs of her wrist and forearm and continued pain with tenderness in the anatomic snuffbox and over the scaphoid tubercle about 2 weeks out from her initial injury, need MRI of the right wrist to evaluate for occult scaphoid fracture.  Seems that most of her pain is coming from the wrist though she is somewhat concerned about her forearm pain in the midportion of the forearm.  Think that the forearm MRI would not really give [place] a great view of her wrist that we need to  evaluate for this occult fracture so we will order MRI of the wrist.  Follow-up after MRI to review results.  Follow-Up Instructions: No follow-ups on file.   Orders:  Orders Placed This Encounter  Procedures   XR Forearm Right   MR Wrist Right w/o contrast   No orders of the defined types were placed in this encounter.     Procedures: No procedures performed   Clinical Data: No additional findings.  Objective: Vital Signs: There were no vitals taken for this visit.  Physical Exam:  Constitutional: Patient appears well-developed HEENT:  Head: Normocephalic Eyes:EOM are normal Neck: Normal range of motion Cardiovascular: Normal rate Pulmonary/chest: Effort normal Neurologic: Patient is alert Skin: Skin is warm Psychiatric: Patient has normal mood and affect  Ortho Exam: Ortho exam demonstrates right wrist with intact wrist extension and wrist flexion.  Intact EPL, FPL, finger abduction, grip strength testing.  She does have tenderness over the anatomic snuffbox and scaphoid tubercle as well as the 3-4 portal and ulnar fovea.  She has less tenderness over the midportion of the forearm along the shaft of the radius but there is no discernible deformity and there is no palpable step-off or anything similar.  There is no pain  with passive motion of the elbow or shoulder in the right arm.  No ecchymosis noted.  2+ radial pulse of the right upper extremity.  DRUJ mobility feels equivalent to the contralateral wrist.  Specialty Comments:  No specialty comments available.  Imaging: No results found.   PMFS History: Patient Active Problem List   Diagnosis Date Noted   BMI 25.0-25.9,adult 01/30/2023   BMI 26.0-26.9,adult 11/21/2022   Hypercholesterolemia 08/02/2022   Other hyperlipidemia 06/27/2022   Aortic arch atherosclerosis (HCC) 11/27/2021   Atherosclerotic heart disease of native coronary artery without angina pectoris 11/27/2021   History of DVT (deep vein  thrombosis) 11/27/2021   Non-toxic multinodular goiter 11/27/2021   Osteopenia 11/27/2021   Primary open-angle glaucoma, bilateral, stage unspecified 11/27/2021   Pure hypercholesterolemia 11/27/2021   Recurrent urinary tract infection 11/27/2021   PAF (paroxysmal atrial fibrillation) (HCC) 11/01/2021   COVID-19 virus infection 10/05/2021   Vertigo 10/04/2021   Insulin resistance 04/07/2021   Generalized obesity 01/31/2021   Decreased ROM of neck 12/23/2019   Decreased vision of left eye 12/23/2019   Increased pressure in the eye, left 12/23/2019   Memory loss 12/23/2019   PONV (postoperative nausea and vomiting) 12/23/2019   Skull lesion 12/19/2019   Focal choroiditis and chorioretinitis, peripheral, 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   Retinal edema 11/18/2019   Uveitis 10/21/2019   Numbness 10/21/2019   Bilateral hearing loss 10/21/2019   Primary open angle glaucoma (POAG) of right eye, severe stage 10/09/2018   Cystocele 01/13/2014   Past Medical History:  Diagnosis Date   CAD (coronary artery disease), native coronary artery    minimal < 25% plaque LAD by coronary CTA 2/23   Complication of anesthesia    be careful with intubation due to previous cervical neck surgery   Cystocele    Diverticulitis    Diverticulosis    DVT (deep venous thrombosis) (HCC)    left leg   Gallbladder problem    Gestational diabetes    Glaucoma    Hemorrhoids internal and external   High cholesterol    History of gestational diabetes yrs ago   Joint pain    Lactose intolerance    Nocturia    Osteoarthritis    PAF (paroxysmal atrial fibrillation) (HCC)    Partial hearing loss    PONV (postoperative nausea and vomiting) 2013   n/v after phenergan, injectable phenergan helps with nausea   Prediabetes     Family History  Problem Relation Age of Onset   Breast cancer Maternal Aunt    Breast cancer Maternal Aunt    Breast cancer Maternal Aunt    Cancer Mother    Hypertension Father    Cancer  Father    Alcohol abuse Father    Obesity Father     Past Surgical History:  Procedure Laterality Date   ABDOMINAL HYSTERECTOMY  2013   ovaries removed    BLADDER SURGERY     BREAST BIOPSY Bilateral 2001   cervical fusion C 5, C 6 and C 7  2004   colonscopy  feb 2015   blood in stool, just was hemorrhoids   CYSTOCELE REPAIR N/A 01/13/2014   Procedure: CYSTOSCOPY, REPAIR CYSTOCELE VAULT PROLAPSE/GRAFT;  Surgeon: Martina Sinner, MD;  Location: [facility];  Service: Urology;  Laterality: N/A;   EYE SURGERY Bilateral 8 yrs ago   lens replacement for cataract   GLAUCOMA SURGERY     SEPTOPLASTY Bilateral 03/01/2020   Procedure: NASAL SEPTOPLASTY;  Surgeon: Newman Pies, MD;  Location: [facility];  Service: ENT;  Laterality: Bilateral;   tummy tuck with mesh  6 yrs ago   large abdominal is present   Social History   Occupational History   Not on file  Tobacco Use   Smoking status: Never   Smokeless tobacco: Never  Vaping Use   Vaping Use: Never used  Substance and Sexual Activity   Alcohol use: Yes    Comment: occasional wine with dinner   Drug use: No   Sexual activity: Not on file

## 2023-03-31 ENCOUNTER — Ambulatory Visit
Admission: RE | Admit: 2023-03-31 | Discharge: 2023-03-31 | Disposition: A | Discharge status: Home / Self Care | Payer: Medicare PPO | Source: Ambulatory Visit | Attending: Surgical | Admitting: Surgical

## 2023-03-31 DIAGNOSIS — S52124A Nondisplaced fracture of head of right radius, initial encounter for closed fracture: Secondary | ICD-10-CM | POA: Diagnosis not present

## 2023-03-31 DIAGNOSIS — M79631 Pain in right forearm: Secondary | ICD-10-CM | POA: Diagnosis not present

## 2023-03-31 DIAGNOSIS — S52134A Nondisplaced fracture of neck of right radius, initial encounter for closed fracture: Secondary | ICD-10-CM | POA: Diagnosis not present

## 2023-03-31 DIAGNOSIS — M25421 Effusion, right elbow: Secondary | ICD-10-CM | POA: Diagnosis not present

## 2023-04-01 ENCOUNTER — Ambulatory Visit
Admission: RE | Admit: 2023-04-01 | Discharge: 2023-04-01 | Disposition: A | Discharge status: Home / Self Care | Payer: Medicare PPO | Source: Ambulatory Visit | Attending: Surgical | Admitting: Surgical

## 2023-04-01 DIAGNOSIS — M25531 Pain in right wrist: Secondary | ICD-10-CM

## 2023-04-01 DIAGNOSIS — M19031 Primary osteoarthritis, right wrist: Secondary | ICD-10-CM | POA: Diagnosis not present

## 2023-04-02 ENCOUNTER — Ambulatory Visit: Payer: Medicare PPO

## 2023-04-02 ENCOUNTER — Telehealth: Payer: Self-pay | Admitting: Orthopedic Surgery

## 2023-04-02 DIAGNOSIS — M25531 Pain in right wrist: Secondary | ICD-10-CM | POA: Diagnosis not present

## 2023-04-02 DIAGNOSIS — L821 Other seborrheic keratosis: Secondary | ICD-10-CM | POA: Diagnosis not present

## 2023-04-02 DIAGNOSIS — B078 Other viral warts: Secondary | ICD-10-CM | POA: Diagnosis not present

## 2023-04-02 DIAGNOSIS — M7981 Nontraumatic hematoma of soft tissue: Secondary | ICD-10-CM | POA: Diagnosis not present

## 2023-04-02 DIAGNOSIS — M79631 Pain in right forearm: Secondary | ICD-10-CM | POA: Diagnosis not present

## 2023-04-02 DIAGNOSIS — D2239 Melanocytic nevi of other parts of face: Secondary | ICD-10-CM | POA: Diagnosis not present

## 2023-04-02 NOTE — Telephone Encounter (Signed)
I called patient and discussed her results.  She is coming in later today.  Will use a sling for short period of time and then get her to go back to not using the sling with some range of motion exercises.  No lifting with that arm.  Will discuss this further when she gets here.

## 2023-04-02 NOTE — Telephone Encounter (Signed)
Pt called stating she had an MI and showed she has a fracture. Pt is asking for an appt today. Pt phone number is [phone]. Please call as soon as possible.

## 2023-04-02 NOTE — Telephone Encounter (Signed)
Pls advise. Patient asking about results.

## 2023-04-06 DIAGNOSIS — T1490XA Injury, unspecified, initial encounter: Secondary | ICD-10-CM | POA: Diagnosis not present

## 2023-04-09 ENCOUNTER — Ambulatory Visit: Payer: Medicare PPO | Admitting: Surgical

## 2023-04-09 ENCOUNTER — Other Ambulatory Visit: Payer: Self-pay

## 2023-04-09 ENCOUNTER — Encounter: Payer: Self-pay | Admitting: Surgical

## 2023-04-09 DIAGNOSIS — S52124D Nondisplaced fracture of head of right radius, subsequent encounter for closed fracture with routine healing: Secondary | ICD-10-CM

## 2023-04-09 NOTE — Progress Notes (Signed)
Follow-up Office Visit Note   Patient: Maureen Elliott           Date of Birth: Mar 10, 1946           MRN: [id number] Visit Date: 04/09/2023 Requested by: Sigmund Hazel, MD [address] [place],  [place] [address] PCP: Sigmund Hazel, MD  Subjective: Chief Complaint  Patient presents with   Other     F/u RUE injury    HPI: Maureen Elliott is a 77 y.o. female who returns to the office for follow-up visit.    Plan at last visit was: Plan is with negative radiographs of her wrist and forearm and continued pain with tenderness in the anatomic snuffbox and over the scaphoid tubercle about 2 weeks out from her initial injury, need MRI of the right wrist to evaluate for occult scaphoid fracture. Seems that most of her pain is coming from the wrist though she is somewhat concerned about her forearm pain in the midportion of the forearm. Think that the forearm MRI would not really give [place] a great view of her wrist that we need to evaluate for this occult fracture so we will order MRI of the wrist. Follow-up after MRI to review results.   Since then, patient had MRI that was reviewed at her nurse only visit last Monday with MRI forearm demonstrating nondisplaced radial head fracture.  No significant pathology noted on MRI of the right wrist.  She states today that her pain is significantly improved.  She still has occasional pain in the midportion of the forearm and not really much elbow pain at all still.  She has been in a sling over the last week.  Has occasionally come out of the sling and tried to lift a milk jug with this arm but quickly realized that is too much weight for her right arm at this point.  She is now about a month out from the initial injury.              ROS: All systems reviewed are negative as they relate to the chief complaint within the history of present illness.  Patient denies fevers or chills.  Assessment & Plan: Visit Diagnoses:  1. Closed nondisplaced fracture of  head of right radius with routine healing, subsequent encounter     Plan: Maureen Elliott is a 77 y.o. female who returns to the office for follow-up visit for right arm radial head fracture.  Plan from last visit was noted above in HPI.  They now return with significant improvement in right arm symptoms since her last visit.  She had MRI demonstrating nondisplaced radial head fracture.  There is no block to her rotation and overall her range of motion is doing very well aside from a little loss of extension.  Recommended that she lift no more than about 3 pounds at this point.  Will discontinue the sling at all times.  She was instructed to work on achieving full extension of the elbow and working on this for about 30 to 60 minutes today.  Radiographs taken today demonstrate no displacement at the fracture site compared with initial radiographs from her first visit on 03/26/2023.  Continue with taking vitamin D.  I recommended using Voltaren gel over the fracture site to limit the chance of heterotopic ossification; she cannot take NSAIDs due to being on Xarelto.  Follow-up with the office in 3 weeks for clinical recheck.  Follow-Up Instructions: No follow-ups on file.  Orders:  Orders Placed This Encounter  Procedures   XR Elbow Complete Right (3+View)   No orders of the defined types were placed in this encounter.     Procedures: No procedures performed   Clinical Data: No additional findings.  Objective: Vital Signs: There were no vitals taken for this visit.  Physical Exam:  Constitutional: Patient appears well-developed HEENT:  Head: Normocephalic Eyes:EOM are normal Neck: Normal range of motion Cardiovascular: Normal rate Pulmonary/chest: Effort normal Neurologic: Patient is alert Skin: Skin is warm Psychiatric: Patient has normal mood and affect  Ortho Exam: Ortho exam demonstrates right arm with intact elbow flexion and extension actively.  She has no significant  bruising throughout the right elbow.  She has excellent pronation and supination range of motion of the right arm compared with the contralateral extremity with no loss of rotation.  She has equivalent flexion of the right elbow compared to contralateral side.  She is lacking about 3 degrees of terminal elbow extension compared with the left elbow.  Intact EPL, FPL, finger abduction, grip strength testing, wrist extension of the right upper extremity.  She has 2+ radial pulse of the right upper extremity.    Specialty Comments:  No specialty comments available.  Imaging: No results found.   PMFS History: Patient Active Problem List   Diagnosis Date Noted   BMI 25.0-25.9,adult 01/30/2023   BMI 26.0-26.9,adult 11/21/2022   Hypercholesterolemia 08/02/2022   Other hyperlipidemia 06/27/2022   Aortic arch atherosclerosis (HCC) 11/27/2021   Atherosclerotic heart disease of native coronary artery without angina pectoris 11/27/2021   History of DVT (deep vein thrombosis) 11/27/2021   Non-toxic multinodular goiter 11/27/2021   Osteopenia 11/27/2021   Primary open-angle glaucoma, bilateral, stage unspecified 11/27/2021   Pure hypercholesterolemia 11/27/2021   Recurrent urinary tract infection 11/27/2021   PAF (paroxysmal atrial fibrillation) (HCC) 11/01/2021   COVID-19 virus infection 10/05/2021   Vertigo 10/04/2021   Insulin resistance 04/07/2021   Generalized obesity 01/31/2021   Decreased ROM of neck 12/23/2019   Decreased vision of left eye 12/23/2019   Increased pressure in the eye, left 12/23/2019   Memory loss 12/23/2019   PONV (postoperative nausea and vomiting) 12/23/2019   Skull lesion 12/19/2019   Focal choroiditis and chorioretinitis, peripheral, 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   Retinal edema 11/18/2019   Uveitis 10/21/2019   Numbness 10/21/2019   Bilateral hearing loss 10/21/2019   Primary open angle glaucoma (POAG) of right eye, severe stage 10/09/2018   Cystocele 01/13/2014    Past Medical History:  Diagnosis Date   CAD (coronary artery disease), native coronary artery    minimal < 25% plaque LAD by coronary CTA 2/23   Complication of anesthesia    be careful with intubation due to previous cervical neck surgery   Cystocele    Diverticulitis    Diverticulosis    DVT (deep venous thrombosis) (HCC)    left leg   Gallbladder problem    Gestational diabetes    Glaucoma    Hemorrhoids internal and external   High cholesterol    History of gestational diabetes yrs ago   Joint pain    Lactose intolerance    Nocturia    Osteoarthritis    PAF (paroxysmal atrial fibrillation) (HCC)    Partial hearing loss    PONV (postoperative nausea and vomiting) 2013   n/v after phenergan, injectable phenergan helps with nausea   Prediabetes     Family History  Problem Relation Age of Onset   Breast cancer Maternal Aunt  Breast cancer Maternal Aunt    Breast cancer Maternal Aunt    Cancer Mother    Hypertension Father    Cancer Father    Alcohol abuse Father    Obesity Father     Past Surgical History:  Procedure Laterality Date   ABDOMINAL HYSTERECTOMY  2013   ovaries removed    BLADDER SURGERY     BREAST BIOPSY Bilateral 2001   cervical fusion C 5, C 6 and C 7  2004   colonscopy  feb 2015   blood in stool, just was hemorrhoids   CYSTOCELE REPAIR N/A 01/13/2014   Procedure: CYSTOSCOPY, REPAIR CYSTOCELE VAULT PROLAPSE/GRAFT;  Surgeon: Martina Sinner, MD;  Location: [facility];  Service: Urology;  Laterality: N/A;   EYE SURGERY Bilateral 8 yrs ago   lens replacement for cataract   GLAUCOMA SURGERY     SEPTOPLASTY Bilateral 03/01/2020   Procedure: NASAL SEPTOPLASTY;  Surgeon: Newman Pies, MD;  Location: [facility];  Service: ENT;  Laterality: Bilateral;   tummy tuck with mesh  6 yrs ago   large abdominal is present   Social History   Occupational History   Not on file  Tobacco Use   Smoking status: Never   Smokeless tobacco: Never   Vaping Use   Vaping status: Never Used  Substance and Sexual Activity   Alcohol use: Yes    Comment: occasional wine with dinner   Drug use: No   Sexual activity: Not on file

## 2023-04-23 ENCOUNTER — Encounter: Payer: Self-pay | Admitting: Bariatrics

## 2023-04-23 ENCOUNTER — Ambulatory Visit: Payer: Medicare PPO | Admitting: Bariatrics

## 2023-04-23 VITALS — BP 144/74 | HR 65 | Temp 97.9°F | Ht 63.0 in | Wt 150.0 lb

## 2023-04-23 DIAGNOSIS — E669 Obesity, unspecified: Secondary | ICD-10-CM

## 2023-04-23 DIAGNOSIS — E88819 Insulin resistance, unspecified: Secondary | ICD-10-CM

## 2023-04-23 DIAGNOSIS — Z6826 Body mass index (BMI) 26.0-26.9, adult: Secondary | ICD-10-CM

## 2023-04-24 NOTE — Progress Notes (Unsigned)
Chief Complaint:   OBESITY Monice is here to discuss her progress with her obesity treatment plan along with follow-up of her obesity related diagnoses. Ladaja is on the Category 3 Plan and states she is following her eating plan approximately 0% of the time. Latima states she is doing 0 minutes 0 times per week.  Today's visit was #: 36 Starting weight: 171 lbs Starting date: 04/28/2020 Today's weight: 150 lbs Today's date: 04/23/2023 Total lbs lost to date: 21 Total lbs lost since last in-office visit: 0  Interim History: Patient is up 2 pounds since her last visit.  She had a bad fall and she is recovering, and she is not able to be as active.  Subjective:   1. Insulin resistance Patient is not on medications.  Assessment/Plan:   1. Insulin resistance Patient will get back on track, and will keep all carbohydrates low (sugar and starches).   2. Generalized obesity  3. BMI 26.0-26.9,adult Yeilyn is currently in the action stage of change. As such, her goal is to continue with weight loss efforts. She has agreed to the Category 3 Plan.   Patient will get back on track.  She will see the dentist next week.  She will keep her calories and protein high.  Journal sheet for calories and protein was given today.  Exercise goals: No exercise has been prescribed at this time.  Behavioral modification strategies: increasing lean protein intake, decreasing simple carbohydrates, increasing vegetables, increasing water intake, decreasing eating out, no skipping meals, meal planning and cooking strategies, keeping healthy foods in the home, and planning for success.  Karem has agreed to follow-up with our clinic in 4 weeks. She was informed of the importance of frequent follow-up visits to maximize her success with intensive lifestyle modifications for her multiple health conditions.   Objective:   Blood pressure (!) 144/74, pulse 65, temperature 97.9 F (36.6 C), height 5\' 3"  (1.6 m),  weight 150 lb (68 kg), SpO2 99%. Body mass index is 26.57 kg/m.  General: Cooperative, alert, well developed, in no acute distress. HEENT: Conjunctivae and lids unremarkable. Cardiovascular: Regular rhythm.  Lungs: Normal work of breathing. Neurologic: No focal deficits.   Lab Results  Component Value Date   CREATININE 0.78 04/17/2022   BUN 18 04/17/2022   NA 142 04/17/2022   K 4.1 04/17/2022   CL 103 04/17/2022   CO2 25 04/17/2022   Lab Results  Component Value Date   ALT 23 10/16/2022   AST 20 04/17/2022   ALKPHOS 68 04/17/2022   BILITOT 0.4 04/17/2022   Lab Results  Component Value Date   HGBA1C 5.3 10/04/2021   HGBA1C 5.7 (H) 03/31/2021   HGBA1C 5.5 11/09/2020   HGBA1C 5.8 (H) 08/05/2020   HGBA1C 5.7 (H) 04/28/2020   Lab Results  Component Value Date   INSULIN 2.8 11/09/2020   INSULIN 5.1 08/05/2020   INSULIN 7.6 04/28/2020   Lab Results  Component Value Date   TSH 1.560 04/28/2020   Lab Results  Component Value Date   CHOL 117 10/16/2022   HDL 59 10/16/2022   LDLCALC 47 10/16/2022   TRIG 45 10/16/2022   CHOLHDL 2.0 10/16/2022   Lab Results  Component Value Date   VD25OH 49.0 04/17/2022   VD25OH 51.2 03/31/2021   VD25OH 76.1 11/09/2020   Lab Results  Component Value Date   WBC 8.0 10/05/2021   HGB 12.7 10/05/2021   HCT 38.6 10/05/2021   MCV 93.5 10/05/2021  PLT 228 10/05/2021   No results found for: "IRON", "TIBC", "FERRITIN"  Attestation Statements:   Reviewed by clinician on day of visit: allergies, medications, problem list, medical history, surgical history, family history, social history, and previous encounter notes.   Trude Mcburney, am acting as Energy manager for [employer], DO.  I have reviewed the above documentation for accuracy and completeness, and I agree with the above. Corinna Capra, DO

## 2023-04-26 ENCOUNTER — Encounter: Payer: Self-pay | Admitting: Bariatrics

## 2023-05-02 ENCOUNTER — Ambulatory Visit: Payer: Medicare PPO | Admitting: Surgical

## 2023-05-02 ENCOUNTER — Other Ambulatory Visit: Payer: Self-pay

## 2023-05-02 ENCOUNTER — Ambulatory Visit: Payer: Medicare PPO | Admitting: Cardiology

## 2023-05-02 DIAGNOSIS — S52124D Nondisplaced fracture of head of right radius, subsequent encounter for closed fracture with routine healing: Secondary | ICD-10-CM | POA: Diagnosis not present

## 2023-05-05 ENCOUNTER — Encounter: Payer: Self-pay | Admitting: Surgical

## 2023-05-05 NOTE — Progress Notes (Signed)
Post-Op Visit Note   Patient: Maureen Elliott           Date of Birth: March 17, 1946           MRN: [id number] Visit Date: 05/02/2023 PCP: Sigmund Hazel, MD   Assessment & Plan:  Chief Complaint:  Chief Complaint  Patient presents with   Right Wrist - Follow-up   Visit Diagnoses:  1. Closed nondisplaced fracture of head of right radius with routine healing, subsequent encounter     Plan: Patient is a 77 year old female who returns for reevaluation of right elbow radial head fracture.  She is about 7 to 8 weeks out from initial injury.  States that her elbow and forearm are feeling significantly better and she really does not have any pain.  No mechanical symptoms.  She has been lifting 10 pound weights with no difficulty.  Taking vitamin D.  Does not have to take any pain medication for her elbow.  On exam, patient has full active and passive range of motion of the right elbow.  There is no loss of pronation or supination compared to contralateral side.  She has excellent bicep and tricep strength.  There is no crepitus noted with passive motion of the elbow.  She has intact EPL, FPL, finger abduction, grip strength testing of the right upper extremity.  2+ radial pulse of the right upper extremity.  Plan is to release patient and slowly increase her lifting.  Would continue with vitamin D supplement.  If any concerns or return of pain, would recommend she repeat return to the office for repeat evaluation but otherwise follow-up as needed.  Follow-Up Instructions: No follow-ups on file.   Orders:  Orders Placed This Encounter  Procedures   XR Elbow Complete Right (3+View)   No orders of the defined types were placed in this encounter.   Imaging: No results found.  PMFS History: Patient Active Problem List   Diagnosis Date Noted   BMI 25.0-25.9,adult 01/30/2023   BMI 26.0-26.9,adult 11/21/2022   Hypercholesterolemia 08/02/2022   Other hyperlipidemia 06/27/2022   Aortic arch  atherosclerosis (HCC) 11/27/2021   Atherosclerotic heart disease of native coronary artery without angina pectoris 11/27/2021   History of DVT (deep vein thrombosis) 11/27/2021   Non-toxic multinodular goiter 11/27/2021   Osteopenia 11/27/2021   Primary open-angle glaucoma, bilateral, stage unspecified 11/27/2021   Pure hypercholesterolemia 11/27/2021   Recurrent urinary tract infection 11/27/2021   PAF (paroxysmal atrial fibrillation) (HCC) 11/01/2021   COVID-19 virus infection 10/05/2021   Vertigo 10/04/2021   Insulin resistance 04/07/2021   Generalized obesity 01/31/2021   Decreased ROM of neck 12/23/2019   Decreased vision of left eye 12/23/2019   Increased pressure in the eye, left 12/23/2019   Memory loss 12/23/2019   PONV (postoperative nausea and vomiting) 12/23/2019   Skull lesion 12/19/2019   Focal choroiditis and chorioretinitis, peripheral, 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   Retinal edema 11/18/2019   Uveitis 10/21/2019   Numbness 10/21/2019   Bilateral hearing loss 10/21/2019   Primary open angle glaucoma (POAG) of right eye, severe stage 10/09/2018   Cystocele 01/13/2014   Past Medical History:  Diagnosis Date   CAD (coronary artery disease), native coronary artery    minimal < 25% plaque LAD by coronary CTA 2/23   Complication of anesthesia    be careful with intubation due to previous cervical neck surgery   Cystocele    Diverticulitis    Diverticulosis    DVT (deep venous thrombosis) (HCC)  left leg   Gallbladder problem    Gestational diabetes    Glaucoma    Hemorrhoids internal and external   High cholesterol    History of gestational diabetes yrs ago   Joint pain    Lactose intolerance    Nocturia    Osteoarthritis    PAF (paroxysmal atrial fibrillation) (HCC)    Partial hearing loss    PONV (postoperative nausea and vomiting) 2013   n/v after phenergan, injectable phenergan helps with nausea   Prediabetes     Family History  Problem Relation Age of  Onset   Breast cancer Maternal Aunt    Breast cancer Maternal Aunt    Breast cancer Maternal Aunt    Cancer Mother    Hypertension Father    Cancer Father    Alcohol abuse Father    Obesity Father     Past Surgical History:  Procedure Laterality Date   ABDOMINAL HYSTERECTOMY  2013   ovaries removed    BLADDER SURGERY     BREAST BIOPSY Bilateral 2001   cervical fusion C 5, C 6 and C 7  2004   colonscopy  feb 2015   blood in stool, just was hemorrhoids   CYSTOCELE REPAIR N/A 01/13/2014   Procedure: CYSTOSCOPY, REPAIR CYSTOCELE VAULT PROLAPSE/GRAFT;  Surgeon: Martina Sinner, MD;  Location: [facility];  Service: Urology;  Laterality: N/A;   EYE SURGERY Bilateral 8 yrs ago   lens replacement for cataract   GLAUCOMA SURGERY     SEPTOPLASTY Bilateral 03/01/2020   Procedure: NASAL SEPTOPLASTY;  Surgeon: Newman Pies, MD;  Location: [facility];  Service: ENT;  Laterality: Bilateral;   tummy tuck with mesh  6 yrs ago   large abdominal is present   Social History   Occupational History   Not on file  Tobacco Use   Smoking status: Never   Smokeless tobacco: Never  Vaping Use   Vaping status: Never Used  Substance and Sexual Activity   Alcohol use: Yes    Comment: occasional wine with dinner   Drug use: No   Sexual activity: Not on file

## 2023-05-07 ENCOUNTER — Ambulatory Visit
Admission: RE | Admit: 2023-05-07 | Discharge: 2023-05-07 | Disposition: A | Discharge status: Home / Self Care | Payer: Medicare PPO | Source: Ambulatory Visit | Attending: Family Medicine | Admitting: Family Medicine

## 2023-05-07 DIAGNOSIS — Z1231 Encounter for screening mammogram for malignant neoplasm of breast: Secondary | ICD-10-CM

## 2023-05-10 ENCOUNTER — Other Ambulatory Visit: Payer: Self-pay | Admitting: Cardiology

## 2023-05-13 ENCOUNTER — Other Ambulatory Visit: Payer: Self-pay | Admitting: Cardiology

## 2023-06-08 DIAGNOSIS — R059 Cough, unspecified: Secondary | ICD-10-CM | POA: Diagnosis not present

## 2023-06-08 DIAGNOSIS — R0989 Other specified symptoms and signs involving the circulatory and respiratory systems: Secondary | ICD-10-CM | POA: Diagnosis not present

## 2023-06-08 DIAGNOSIS — Z6827 Body mass index (BMI) 27.0-27.9, adult: Secondary | ICD-10-CM | POA: Diagnosis not present

## 2023-06-08 DIAGNOSIS — S39012A Strain of muscle, fascia and tendon of lower back, initial encounter: Secondary | ICD-10-CM | POA: Diagnosis not present

## 2023-06-08 DIAGNOSIS — U071 COVID-19: Secondary | ICD-10-CM | POA: Diagnosis not present

## 2023-06-12 ENCOUNTER — Ambulatory Visit: Payer: Medicare PPO | Admitting: Bariatrics

## 2023-07-03 ENCOUNTER — Encounter: Payer: Self-pay | Admitting: Bariatrics

## 2023-07-03 ENCOUNTER — Ambulatory Visit: Payer: Medicare PPO | Admitting: Bariatrics

## 2023-07-03 VITALS — BP 146/71 | HR 65 | Temp 98.1°F | Ht 63.0 in | Wt 153.0 lb

## 2023-07-03 DIAGNOSIS — R7303 Prediabetes: Secondary | ICD-10-CM | POA: Diagnosis not present

## 2023-07-03 DIAGNOSIS — Z6827 Body mass index (BMI) 27.0-27.9, adult: Secondary | ICD-10-CM

## 2023-07-03 DIAGNOSIS — J398 Other specified diseases of upper respiratory tract: Secondary | ICD-10-CM

## 2023-07-03 DIAGNOSIS — E669 Obesity, unspecified: Secondary | ICD-10-CM

## 2023-07-03 NOTE — Progress Notes (Signed)
WEIGHT SUMMARY AND BIOMETRICS  Weight Lost Since Last Visit: 0  Weight Gained Since Last Visit: 3lb   Vitals Temp: 98.1 F (36.7 C) BP: (!) 146/71 Pulse Rate: 65 SpO2: 98 %   Anthropometric Measurements Height: 5\' 3"  (1.6 m) Weight: 153 lb (69.4 kg) BMI (Calculated): 27.11 Weight at Last Visit: 150lb Weight Lost Since Last Visit: 0 Weight Gained Since Last Visit: 3lb Starting Weight: 171lb Total Weight Loss (lbs): 18 lb (8.165 kg)   Body Composition  Body Fat %: 40.6 % Fat Mass (lbs): 62.2 lbs Muscle Mass (lbs): 86.4 lbs Total Body Water (lbs): 63.4 lbs Visceral Fat Rating : 11   Other Clinical Data Fasting: no Labs: no Today's Visit #: 37 Starting Date: 04/28/20    OBESITY Jennika is here to discuss her progress with her obesity treatment plan along with follow-up of her obesity related diagnoses.     Nutrition Plan: the Category 3 plan - 0% adherence.  Current exercise: none  Interim History:  She has gained 3 lbs since her last visit.   Eating all of the food on the plan., Protein intake is as prescribed, Is not skipping meals, Meeting calorie goals., and Water intake is adequate.  Hunger is moderately controlled.  Cravings are moderately controlled.  Assessment/Plan:   Prediabetes Last A1c was 5.3  Medication(s):  Lab Results  Component Value Date   HGBA1C 5.3 10/04/2021   HGBA1C 5.7 (H) 03/31/2021   HGBA1C 5.5 11/09/2020   HGBA1C 5.8 (H) 08/05/2020   HGBA1C 5.7 (H) 04/28/2020   Lab Results  Component Value Date   INSULIN 2.8 11/09/2020   INSULIN 5.1 08/05/2020   INSULIN 7.6 04/28/2020    Plan: Will minimize all refined carbohydrates both sweets and starches.  Will work on the plan and exercise.  Consider both aerobic and resistance training.  Will keep protein, water, and fiber intake high.  Increase Polyunsaturated and Monounsaturated fats to increase satiety and encourage weight loss.  Reviewed her diet in detail  with the following recommendations: decrease milk, and oatmeal, and cheese, and will increase yogurt water and smart snacks.  Congestion of upper respiratory tract:   She states that she is somewhat congested in her upper respiratory tract and that she had COVID about 1 month ago.  She did see her PCP and was started on an inhaler along with cough medication.  She has stopped her inhaler.  Lungs were slightly harsh but no rales or rhonchi, and if sounds were easily auscultated.  Plan: She will resume her inhaler, rest and increase her water.  Signs and symptoms do not abate she will call her PCP.    Generalized Obesity: Current BMI BMI (Calculated): 27.11   Brooksie is currently in the action stage of change. As such, her goal is to continue with weight loss efforts, but close to goal.  She has agreed to the Category 3 plan.  Exercise goals: Older adults should follow the adult guidelines. When older adults cannot meet the adult guidelines, they should be as physically active as their abilities and conditions will allow.   Behavioral modification strategies: increasing lean protein intake, decreasing simple carbohydrates , meal planning , increase water intake, increasing vegetables, keep healthy foods in the home, and travel eating strategies.  Azeneth has agreed to follow-up with our clinic in 4 weeks.     Objective:   VITALS: Per patient if applicable, see vitals. GENERAL: Alert and in no acute distress. CARDIOPULMONARY: No increased WOB. Speaking in  clear sentences.  PSYCH: Pleasant and cooperative. Speech normal rate and rhythm. Affect is appropriate. Insight and judgement are appropriate. Attention is focused, linear, and appropriate.  NEURO: Oriented as arrived to appointment on time with no prompting.   Attestation Statements:   This was prepared with the assistance of Engineer, civil (consulting).  Occasional wrong-word or sound-a-like substitutions may have occurred due to the  inherent limitations of voice recognition software.  Corinna Capra, DO

## 2023-07-13 DIAGNOSIS — H401113 Primary open-angle glaucoma, right eye, severe stage: Secondary | ICD-10-CM | POA: Diagnosis not present

## 2023-07-13 DIAGNOSIS — H401121 Primary open-angle glaucoma, left eye, mild stage: Secondary | ICD-10-CM | POA: Diagnosis not present

## 2023-07-30 ENCOUNTER — Encounter: Payer: Self-pay | Admitting: Cardiology

## 2023-07-30 ENCOUNTER — Ambulatory Visit: Payer: Medicare PPO | Attending: Cardiology | Admitting: Cardiology

## 2023-07-30 VITALS — BP 132/72 | HR 64 | Resp 16 | Ht 63.0 in | Wt 154.4 lb

## 2023-07-30 DIAGNOSIS — I2510 Atherosclerotic heart disease of native coronary artery without angina pectoris: Secondary | ICD-10-CM | POA: Diagnosis not present

## 2023-07-30 DIAGNOSIS — Z79899 Other long term (current) drug therapy: Secondary | ICD-10-CM

## 2023-07-30 DIAGNOSIS — E7800 Pure hypercholesterolemia, unspecified: Secondary | ICD-10-CM | POA: Diagnosis not present

## 2023-07-30 DIAGNOSIS — I480 Paroxysmal atrial fibrillation: Secondary | ICD-10-CM

## 2023-07-30 NOTE — Progress Notes (Addendum)
Cardiology  Note    Date:  07/30/2023   ID:  Maureen Elliott, DOB 03-02-1946, MRN [id number]  PCP:  Sigmund Hazel, MD  Cardiologist:  Armanda Magic, MD   Chief Complaint  Patient presents with   Coronary Artery Disease   Atrial Fibrillation   Hyperlipidemia    History of Present Illness:  Maureen Elliott is a 77 y.o. female with a history of left lower extremity DVT, gestational diabetes, hyperlipidemia, paroxysmal atrial fibrillation and minimal CAD (25% LAD).  She fell in June and broke her jaw and right arm and several teeth.  She says that she tripped and fell.  There was no dizziness and no syncope.  She then had COVID and resulted in problems with her speech (she had similar speech issues the last time she had COVID). She saw her PCP when she had COVID and was having problems with speech at that time but no CT or MRI was done.    She is here today for followup and is doing well.  She denies any chest pain or pressure, SOB, DOE, PND, orthopnea, LE edema, dizziness, palpitations or syncope. She is compliant with her meds and is tolerating meds with no SE.    Past Medical History:  Diagnosis Date   CAD (coronary artery disease), native coronary artery    minimal < 25% plaque LAD by coronary CTA 2/23   Complication of anesthesia    be careful with intubation due to previous cervical neck surgery   Cystocele    Diverticulitis    Diverticulosis    DVT (deep venous thrombosis) (HCC)    left leg   Gallbladder problem    Gestational diabetes    Glaucoma    Hemorrhoids internal and external   High cholesterol    History of gestational diabetes yrs ago   Joint pain    Lactose intolerance    Nocturia    Osteoarthritis    PAF (paroxysmal atrial fibrillation) (HCC)    Partial hearing loss    PONV (postoperative nausea and vomiting) 2013   n/v after phenergan, injectable phenergan helps with nausea   Prediabetes     Past Surgical History:  Procedure Laterality Date    ABDOMINAL HYSTERECTOMY  2013   ovaries removed    BLADDER SURGERY     BREAST BIOPSY Bilateral 2001   cervical fusion C 5, C 6 and C 7  2004   colonscopy  feb 2015   blood in stool, just was hemorrhoids   CYSTOCELE REPAIR N/A 01/13/2014   Procedure: CYSTOSCOPY, REPAIR CYSTOCELE VAULT PROLAPSE/GRAFT;  Surgeon: Martina Sinner, MD;  Location: [facility];  Service: Urology;  Laterality: N/A;   EYE SURGERY Bilateral 8 yrs ago   lens replacement for cataract   GLAUCOMA SURGERY     SEPTOPLASTY Bilateral 03/01/2020   Procedure: NASAL SEPTOPLASTY;  Surgeon: Newman Pies, MD;  [facility];  Service: ENT;  Laterality: Bilateral;   tummy tuck with mesh  6 yrs ago   large abdominal is present    Current Medications: Current Meds  Medication Sig   acetaminophen (TYLENOL) 325 MG tablet Take 650 mg by mouth every 6 (six) hours as needed for mild pain, fever or headache.   Artificial Tear Solution (GENTEAL TEARS) 0.1-0.2-0.3 % SOLN Apply to eye as needed.   atorvastatin (LIPITOR) 40 MG tablet TAKE 1 TABLET BY MOUTH EVERY DAY   brimonidine (ALPHAGAN) 0.2 % ophthalmic solution Place 1 drop into both eyes  3 (three) times daily.   CALCIUM 600 1500 (600 Ca) MG TABS tablet SMARTSIG:1 Tablet(s) By Mouth   Cholecalciferol (VITAMIN D) 50 MCG (2000 UT) CAPS Take 1 capsule (2,000 Units total) by mouth daily.   conjugated estrogens (PREMARIN) vaginal cream Place 1 Applicatorful vaginally 2 (two) times a week.   ezetimibe (ZETIA) 10 MG tablet Take 1 tablet (10 mg total) by mouth daily.   Glucosamine Sulfate 500 MG TABS Take 1 tablet by mouth daily.   ipratropium (ATROVENT) 0.06 % nasal spray Place 2 sprays into both nostrils in the morning and at bedtime.   melatonin 5 MG TABS Take 5 mg by mouth at bedtime.   meloxicam (MOBIC) 7.5 MG tablet Take 7.5 mg by mouth 2 (two) times daily.   Multiple Vitamin (MULTIVITAMIN WITH MINERALS) TABS tablet Take 1 tablet by mouth daily.   Nutritional Supplements  (GLUCOSAMINE COMPLEX PO) Take by mouth.   pilocarpine (PILOCAR) 1 % ophthalmic solution Place 1 drop into both eyes 3 (three) times daily.   Polyethyl Glycol-Propyl Glycol 0.4-0.3 % SOLN Apply to eye as needed.   timolol (TIMOPTIC) 0.5 % ophthalmic solution Place 1 drop into both eyes 2 (two) times daily.   XARELTO 20 MG TABS tablet TAKE 1 TABLET BY MOUTH EVERY DAY   [DISCONTINUED] tiZANidine (ZANAFLEX) 2 MG tablet Take 2 mg by mouth at bedtime.    Allergies:   Amoxicillin, Attapulgite, Caffeine, Dorzolamide, Fish oil, Keflex [cephalexin], Levaquin [levofloxacin], Maxitrol [neomycin-polymyxin-dexameth], Methotrexate derivatives, Nsaids, Prednisone, Restasis [cyclosporine], Rofecoxib, and Valtrex [valacyclovir]   Social History   Socioeconomic History   Marital status: Married    Spouse name: Jake Shark "will"   Number of children: 1   Years of education: Not on file   Highest education level: Not on file  Occupational History   Not on file  Tobacco Use   Smoking status: Never   Smokeless tobacco: Never  Vaping Use   Vaping status: Never Used  Substance and Sexual Activity   Alcohol use: Yes    Comment: occasional wine with dinner   Drug use: No   Sexual activity: Not on file  Other Topics Concern   Not on file  Social History Narrative   Right handed    Lives with husband   Social Determinants of Health   Financial Resource Strain: Not on file  Food Insecurity: Not on file  Transportation Needs: Not on file  Physical Activity: Not on file  Stress: Not on file  Social Connections: Unknown (02/07/2022)   [facility]   Social Network    Social Network: Not on file     Family History:  The patient's family history includes Alcohol abuse in her father; Breast cancer in her maternal aunt, maternal aunt, and maternal aunt; Cancer in her father and mother; Hypertension in her father; Obesity in her father.   ROS:   Please see the history of  present illness.    ROS All other systems reviewed and are negative.      No data to display             PHYSICAL EXAM:   VS:  BP 132/72 (BP Location: Left Arm, Patient Position: Sitting, Cuff Size: Large)   Pulse 64   Resp 16   Ht 5\' 3"  (1.6 m)   Wt 154 lb 6.4 oz (70 kg)   SpO2 98%   BMI 27.35 kg/m    GEN: Well nourished, well developed in no acute distress  HEENT: Normal NECK: No JVD; No carotid bruits LYMPHATICS: No lymphadenopathy CARDIAC:RRR, no murmurs, rubs, gallops RESPIRATORY:  Clear to auscultation without rales, wheezing or rhonchi  ABDOMEN: Soft, non-tender, non-distended MUSCULOSKELETAL:  No edema; No deformity  SKIN: Warm and dry NEUROLOGIC:  Alert and oriented x 3 PSYCHIATRIC:  Normal affect  Wt Readings from Last 3 Encounters:  07/30/23 154 lb 6.4 oz (70 kg)  07/03/23 153 lb (69.4 kg)  04/23/23 150 lb (68 kg)      Studies/Labs Reviewed:   EKG Interpretation Date/Time:  Monday July 30 2023 08:50:36 EST Ventricular Rate:  68 PR Interval:  168 QRS Duration:  92 QT Interval:  384 QTC Calculation: 408 R Axis:   -14  Text Interpretation: Normal sinus rhythm with sinus arrhythmia Low voltage QRS When compared with ECG of 04-Oct-2021 09:48, PREVIOUS ECG IS PRESENT Confirmed by Armanda Magic ([id number]) on 07/30/2023 9:21:47 AM    Recent Labs: 10/16/2022: ALT 23   Lipid Panel    Component Value Date/Time   CHOL 117 10/16/2022 0746   TRIG 45 10/16/2022 0746   HDL 59 10/16/2022 0746   CHOLHDL 2.0 10/16/2022 0746   LDLCALC 47 10/16/2022 0746     CHA2DS2-VASc Score = 4  This indicates a 4.8% annual risk of stroke. The patient's score is based upon: CHF History: 0 HTN History: 0 Diabetes History: 0 Stroke History: 0 Vascular Disease History: 1 Age Score: 2 Gender Score: 1    Additional studies/ records that were reviewed today include:  Hospital notes from January 23 and 2D echo    ASSESSMENT:    1. Nonobstructive atherosclerosis of  coronary artery   2. PAF (paroxysmal atrial fibrillation) (HCC)   3. Pure hypercholesterolemia      PLAN:  In order of problems listed above:  ASCAD -coronary CTA with minimal nonobstructive CAD with < 25% LAD -She has not had any anginal symptoms since I saw her last -no ASA due to DOAC -Continue prescription drug management with atorvastatin 40 mg daily and Zetia 10mg  daily with as needed refills  2.  PAF -She is in normal sinus rhythm on exam today and denies any palpitations and no bleeding issues on DOAC -CHADS2VASC score is 2   -2D echo showed normal LV function -[place] home sleep study  with no sleep apnea -no recurrent PAF on event monitor 11/2021 -Continue prescription drug management with Xarelto 20 mg daily with as needed refills -I have personally reviewed and interpreted outside labs performed by patient's PCP which showed serum creatinine 0.70 and potassium 4.3 on 01/22/2023  3.  HLD -LDL goal < 70 -I have personally reviewed and interpreted outside labs performed by patient's PCP which showed LDL 47 and HDL 59 10/16/2022 -Continue prescription drug management with atorvastatin 40 mg daily and Zetia 10 mg daily with as needed refills -repeat FLp and ALT in January 2025  Time Spent: 20 minutes total time of encounter, including 15 minutes spent in face-to-face patient care on the date of this encounter. This time includes coordination of care and counseling regarding above mentioned problem list. Remainder of non-face-to-face time involved reviewing chart documents/testing relevant to the patient encounter and documentation in the medical record. I have independently reviewed documentation from referring provider  Medication Adjustments/Labs and Tests Ordered: Current medicines are reviewed at length with the patient today.  Concerns regarding medicines are outlined above.  Medication changes, Labs and Tests ordered today are listed in the Patient Instructions  below.  There are no Patient  Instructions on file for this visit.   Signed, Armanda Magic, MD  07/30/2023 9:12 AM    [facility] [address] [place], [place], [place]  [address] Phone: [phone]; Fax: [phone]

## 2023-07-30 NOTE — Addendum Note (Signed)
Addended by: Luellen Pucker on: 07/30/2023 09:27 AM   Modules accepted: Orders

## 2023-07-30 NOTE — Patient Instructions (Addendum)
Medication Instructions:  Your physician recommends that you continue on your current medications as directed. Please refer to the Current Medication list given to you today.  *If you need a refill on your cardiac medications before your next appointment, please call your pharmacy*   Lab Work: Please make an appt to complete a FASTING lipid panel and an ALT in January 2025.  If you have labs (blood work) drawn today and your tests are completely normal, you will receive your results only by: MyChart Message (if you have MyChart) OR A paper copy in the mail If you have any lab test that is abnormal or we need to change your treatment, we will call you to review the results.   Testing/Procedures: None.   Follow-Up:   Your next appointment:   1 year(s)  Provider:   Armanda Magic, MD     Other Instructions You have an appointment with your PCP Dr. Sigmund Hazel this Wednesday, 08/01/23 at 11:30 AM.

## 2023-07-31 ENCOUNTER — Encounter: Payer: Self-pay | Admitting: Bariatrics

## 2023-07-31 ENCOUNTER — Ambulatory Visit: Payer: Medicare PPO | Admitting: Bariatrics

## 2023-07-31 VITALS — BP 134/78 | HR 70 | Temp 97.8°F | Ht 63.0 in | Wt 150.0 lb

## 2023-07-31 DIAGNOSIS — E669 Obesity, unspecified: Secondary | ICD-10-CM | POA: Diagnosis not present

## 2023-07-31 DIAGNOSIS — R7303 Prediabetes: Secondary | ICD-10-CM

## 2023-07-31 DIAGNOSIS — Z6826 Body mass index (BMI) 26.0-26.9, adult: Secondary | ICD-10-CM

## 2023-07-31 NOTE — Progress Notes (Signed)
   WEIGHT SUMMARY AND BIOMETRICS  Weight Lost Since Last Visit: 3lb  Weight Gained Since Last Visit: 0   Vitals Temp: 97.8 F (36.6 C) BP: 134/78 Pulse Rate: 70 SpO2: 97 %   Anthropometric Measurements Height: 5\' 3"  (1.6 m) Weight: 150 lb (68 kg) BMI (Calculated): 26.58 Weight at Last Visit: 153lb Weight Lost Since Last Visit: 3lb Weight Gained Since Last Visit: 0 Starting Weight: 171lb Total Weight Loss (lbs): 21 lb (9.526 kg)   Body Composition  Body Fat %: 39.4 % Fat Mass (lbs): 59.2 lbs Muscle Mass (lbs): 86.6 lbs Total Body Water (lbs): 63 lbs Visceral Fat Rating : 11   Other Clinical Data Fasting: no Labs: no Today's Visit #: 66 Starting Date: 04/28/20    OBESITY Noga is here to discuss her progress with her obesity treatment plan along with follow-up of her obesity related diagnoses.     Nutrition Plan: the Category 3 plan - 50% adherence.  Current exercise:  Silver sneakers  Interim History:  She is down another 3 lbs since her last visit.  Eating all of the food on the plan., Protein intake is as prescribed, and Water intake is adequate.  Hunger is moderately controlled.  Cravings are moderately controlled.  Assessment/Plan:   Prediabetes Last A1c was 5.3  Medication(s): none Lab Results  Component Value Date   HGBA1C 5.3 10/04/2021   HGBA1C 5.7 (H) 03/31/2021   HGBA1C 5.5 11/09/2020   HGBA1C 5.8 (H) 08/05/2020   HGBA1C 5.7 (H) 04/28/2020   Lab Results  Component Value Date   INSULIN 2.8 11/09/2020   INSULIN 5.1 08/05/2020   INSULIN 7.6 04/28/2020    Plan: Will keep her fiber high to about 30 to 32 grams.  Will minimize all refined carbohydrates both sweets and starches.  Will work on the plan and exercise.  Consider both aerobic and resistance training.  Will keep protein, water, and fiber intake high.  Increase Polyunsaturated and Monounsaturated fats to increase satiety and encourage weight loss. Will begin an  Omega 3 supplement.  Aim for 7 to 9 hours of sleep nightly.  Will continue cardio and increase weights.     Generalized Obesity: Current BMI BMI (Calculated): 26.58   Zaide is currently in the action stage of change. As such, her goal is to continue with weight loss efforts.  She has agreed to the Category 3 plan.  Exercise goals: Older adults should determine their level of effort for physical activity relative to their level of fitness.   Behavioral modification strategies: increasing lean protein intake, no meal skipping, increase water intake, better snacking choices, planning for success, keep healthy foods in the home, and measure portion sizes.  Dasie has agreed to follow-up with our clinic in 4 weeks.      Objective:   VITALS: Per patient if applicable, see vitals. GENERAL: Alert and in no acute distress. CARDIOPULMONARY: No increased WOB. Speaking in clear sentences.  PSYCH: Pleasant and cooperative. Speech normal rate and rhythm. Affect is appropriate. Insight and judgement are appropriate. Attention is focused, linear, and appropriate.  NEURO: Oriented as arrived to appointment on time with no prompting.   Attestation Statements:   This was prepared with the assistance of Engineer, civil (consulting).  Occasional wrong-word or sound-a-like substitutions may have occurred due to the inherent limitations of voice recognition software.   Corinna Capra, DO

## 2023-08-01 DIAGNOSIS — Z9181 History of falling: Secondary | ICD-10-CM | POA: Diagnosis not present

## 2023-08-01 DIAGNOSIS — R4789 Other speech disturbances: Secondary | ICD-10-CM | POA: Diagnosis not present

## 2023-08-01 DIAGNOSIS — R4189 Other symptoms and signs involving cognitive functions and awareness: Secondary | ICD-10-CM | POA: Diagnosis not present

## 2023-08-01 DIAGNOSIS — R413 Other amnesia: Secondary | ICD-10-CM | POA: Diagnosis not present

## 2023-08-02 ENCOUNTER — Other Ambulatory Visit: Payer: Self-pay | Admitting: Family Medicine

## 2023-08-02 DIAGNOSIS — R4789 Other speech disturbances: Secondary | ICD-10-CM

## 2023-08-11 ENCOUNTER — Other Ambulatory Visit: Payer: Self-pay | Admitting: Cardiology

## 2023-08-17 ENCOUNTER — Encounter: Payer: Self-pay | Admitting: Family Medicine

## 2023-08-28 ENCOUNTER — Ambulatory Visit: Payer: Medicare PPO | Admitting: Bariatrics

## 2023-08-28 ENCOUNTER — Encounter: Payer: Self-pay | Admitting: Bariatrics

## 2023-08-28 VITALS — BP 121/61 | HR 69 | Temp 97.9°F | Ht 63.0 in | Wt 149.0 lb

## 2023-08-28 DIAGNOSIS — Z6826 Body mass index (BMI) 26.0-26.9, adult: Secondary | ICD-10-CM

## 2023-08-28 DIAGNOSIS — R7303 Prediabetes: Secondary | ICD-10-CM

## 2023-08-28 DIAGNOSIS — E669 Obesity, unspecified: Secondary | ICD-10-CM

## 2023-08-28 NOTE — Progress Notes (Signed)
WEIGHT SUMMARY AND BIOMETRICS  Weight Lost Since Last Visit: 1lb  Weight Gained Since Last Visit: 0   Vitals Temp: 97.9 F (36.6 C) BP: 121/61 Pulse Rate: 69 SpO2: 97 %   Anthropometric Measurements Height: 5\' 3"  (1.6 m) Weight: 149 lb (67.6 kg) BMI (Calculated): 26.4 Weight at Last Visit: 150lb Weight Lost Since Last Visit: 1lb Weight Gained Since Last Visit: 0 Starting Weight: 171lb Total Weight Loss (lbs): 22 lb (9.979 kg)   Body Composition  Body Fat %: 39.7 % Fat Mass (lbs): 59.4 lbs Muscle Mass (lbs): 85.8 lbs Total Body Water (lbs): 62 lbs Visceral Fat Rating : 11   Other Clinical Data Fasting: no Labs: no Today's Visit #: 64 Starting Date: 04/28/20    OBESITY Maureen Elliott is here to discuss her progress with her obesity treatment plan along with follow-up of her obesity related diagnoses.    Nutrition Plan: the Category 3 plan - 90% adherence.  Current exercise:  Silver sneakers and water exercises.  Interim History:  She is down 1 lbs since her last visit.  Eating all of the food on the plan., Protein intake is as prescribed, Is not skipping meals, Meeting protein goals., Water intake is adequate., and Denies polyphagia  Hunger is moderately controlled.  Cravings are moderately controlled.    Assessment/Plan:   Prediabetes Last A1c was 5.3  Medication(s): none Lab Results  Component Value Date   HGBA1C 5.3 10/04/2021   HGBA1C 5.7 (H) 03/31/2021   HGBA1C 5.5 11/09/2020   HGBA1C 5.8 (H) 08/05/2020   HGBA1C 5.7 (H) 04/28/2020   Lab Results  Component Value Date   INSULIN 2.8 11/09/2020   INSULIN 5.1 08/05/2020   INSULIN 7.6 04/28/2020    Plan: Will minimize all refined carbohydrates both sweets and starches.  Will work on the plan and exercise.  Consider both aerobic and resistance training.  Will keep protein, water, and  fiber intake high.  Increase Polyunsaturated and Monounsaturated fats to increase satiety and encourage weight loss.  She will continue to eat healthier fruit. Discussed strategies for the holidays. She will continue water exercise and will utilize the Silver sneakers program.     Generalized Obesity: Current BMI BMI (Calculated): 26.4    Maureen Elliott is currently in the action stage of change. As such, her goal is to continue with weight loss efforts.  She has agreed to the Category 3 plan.  Exercise goals: Older adults should determine their level of effort for physical activity relative to their level of fitness.   Behavioral modification strategies: increasing lean protein intake, decreasing simple carbohydrates , no meal skipping, meal planning , decrease liquid calories, better snacking choices, planning for success, increasing vegetables, increasing fiber rich foods, avoiding temptations, and mindful eating.  Maureen Elliott has agreed to follow-up with our clinic in 4 weeks.      Objective:   VITALS:  Per patient if applicable, see vitals. GENERAL: Alert and in no acute distress. CARDIOPULMONARY: No increased WOB. Speaking in clear sentences.  PSYCH: Pleasant and cooperative. Speech normal rate and rhythm. Affect is appropriate. Insight and judgement are appropriate. Attention is focused, linear, and appropriate.  NEURO: Oriented as arrived to appointment on time with no prompting.   Attestation Statements:    This was prepared with the assistance of Engineer, civil (consulting).  Occasional wrong-word or sound-a-like substitutions may have occurred due to the inherent limitations of voice recognition

## 2023-08-29 ENCOUNTER — Ambulatory Visit
Admission: RE | Admit: 2023-08-29 | Discharge: 2023-08-29 | Disposition: A | Discharge status: Home / Self Care | Payer: Medicare PPO | Source: Ambulatory Visit | Attending: Family Medicine | Admitting: Family Medicine

## 2023-08-29 DIAGNOSIS — G319 Degenerative disease of nervous system, unspecified: Secondary | ICD-10-CM | POA: Diagnosis not present

## 2023-08-29 DIAGNOSIS — I6782 Cerebral ischemia: Secondary | ICD-10-CM | POA: Diagnosis not present

## 2023-08-29 DIAGNOSIS — R4789 Other speech disturbances: Secondary | ICD-10-CM

## 2023-08-29 DIAGNOSIS — R413 Other amnesia: Secondary | ICD-10-CM | POA: Diagnosis not present

## 2023-08-29 MED ORDER — GADOPICLENOL 0.5 MMOL/ML IV SOLN
7.0000 mL | Freq: Once | INTRAVENOUS | Status: AC | PRN
  Administered 2023-08-29: 7 mL via INTRAVENOUS

## 2023-09-16 DIAGNOSIS — R3989 Other symptoms and signs involving the genitourinary system: Secondary | ICD-10-CM | POA: Diagnosis not present

## 2023-09-16 DIAGNOSIS — Z889 Allergy status to unspecified drugs, medicaments and biological substances status: Secondary | ICD-10-CM | POA: Diagnosis not present

## 2023-09-24 ENCOUNTER — Other Ambulatory Visit: Payer: Self-pay | Admitting: Cardiology

## 2023-09-24 NOTE — Telephone Encounter (Addendum)
Pt last saw Dr Mayford Knife 07/30/23, last labs 08/01/23 Creat 0.81 at [facility] per [facility], age 77, weight 67.6kg, CrCl 63.06, based on CrCl pt is on appropriate dosage of Xarelto 20mg  every day for afib.  Will refill rx.

## 2023-09-27 ENCOUNTER — Ambulatory Visit: Payer: Medicare PPO | Admitting: Bariatrics

## 2023-09-27 ENCOUNTER — Encounter: Payer: Self-pay | Admitting: Bariatrics

## 2023-09-27 VITALS — BP 108/65 | HR 65 | Temp 97.4°F | Ht 63.0 in | Wt 150.0 lb

## 2023-09-27 DIAGNOSIS — R7303 Prediabetes: Secondary | ICD-10-CM

## 2023-09-27 DIAGNOSIS — E669 Obesity, unspecified: Secondary | ICD-10-CM | POA: Diagnosis not present

## 2023-09-27 DIAGNOSIS — Z6826 Body mass index (BMI) 26.0-26.9, adult: Secondary | ICD-10-CM

## 2023-09-27 NOTE — Progress Notes (Signed)
 WEIGHT SUMMARY AND BIOMETRICS  Weight Lost Since Last Visit: 0  Weight Gained Since Last Visit: 1lb   Vitals Temp: (!) 97.4 F (36.3 C) BP: 108/65 Pulse Rate: 65 SpO2: 98 %   Anthropometric Measurements Height: 5' 3 (1.6 m) Weight: 150 lb (68 kg) BMI (Calculated): 26.58 Weight at Last Visit: 149lb Weight Lost Since Last Visit: 0 Weight Gained Since Last Visit: 1lb Starting Weight: 171lb Total Weight Loss (lbs): 21 lb (9.526 kg)   Body Composition  Body Fat %: 39.4 % Fat Mass (lbs): 59.2 lbs Muscle Mass (lbs): 86.4 lbs Total Body Water (lbs): 61.2 lbs Visceral Fat Rating : 11   Other Clinical Data Fasting: no Labs: no Today's Visit #: 40 Starting Date: 04/28/20    OBESITY Maureen Elliott is here to discuss her progress with her obesity treatment plan along with follow-up of her obesity related diagnoses.    Nutrition Plan: the Category 3 plan - 0% adherence.  Current exercise:  [employer] classes  Interim History:  She is down 1 lb since her last visit. She has been able to maintain her weight.  Eating all of the food on the plan., Protein intake is as prescribed, Is not skipping meals, and Water intake is adequate.   Hunger is moderately controlled.  Cravings are moderately controlled.   Assessment/Plan:   Prediabetes Last A1c was 5.3  Medication(s): none Lab Results  Component Value Date   HGBA1C 5.3 10/04/2021   HGBA1C 5.7 (H) 03/31/2021   HGBA1C 5.5 11/09/2020   HGBA1C 5.8 (H) 08/05/2020   HGBA1C 5.7 (H) 04/28/2020   Lab Results  Component Value Date   INSULIN  2.8 11/09/2020   INSULIN  5.1 08/05/2020   INSULIN  7.6 04/28/2020    Plan: Will minimize all refined carbohydrates both sweets and starches.  Will work on the plan and exercise.  Consider both aerobic and resistance training. She will continue to go to [employer]. Will keep  protein, water, and fiber intake high.  Increase Polyunsaturated and Monounsaturated fats to increase satiety and encourage weight loss.  She will incorporate aspects of the inflammatory diet.    Generalized Obesity: Current BMI BMI (Calculated): 26.58    Maureen Elliott is currently in the action stage of change. As such, her goal is to continue with weight loss efforts.  She has agreed to the Category 3 plan.  Exercise goals: Older adults should follow the adult guidelines. When older adults cannot meet the adult guidelines, they should be as physically active as their abilities and conditions will allow.   Behavioral modification strategies: increasing lean protein intake, decreasing simple carbohydrates , no meal skipping, meal planning , increase water intake, better snacking choices, planning for success, increasing fiber rich foods, avoiding temptations, keep healthy foods in the home, and mindful eating.  Maureen Elliott has agreed to follow-up with our clinic in 4 weeks.  Objective:   VITALS: Per patient if applicable, see vitals. GENERAL: Alert and in no acute distress. CARDIOPULMONARY: No increased WOB. Speaking in clear sentences.  PSYCH: Pleasant and cooperative. Speech normal rate and rhythm. Affect is appropriate. Insight and judgement are appropriate. Attention is focused, linear, and appropriate.  NEURO: Oriented as arrived to appointment on time with no prompting.   Attestation Statements:   This was prepared with the assistance of Engineer, Civil (consulting).  Occasional wrong-word or sound-a-like substitutions may have occurred due to the inherent limitations of voice recognition   Clayborne Daring, DO

## 2023-10-04 DIAGNOSIS — L308 Other specified dermatitis: Secondary | ICD-10-CM | POA: Diagnosis not present

## 2023-10-04 DIAGNOSIS — L812 Freckles: Secondary | ICD-10-CM | POA: Diagnosis not present

## 2023-10-04 DIAGNOSIS — L821 Other seborrheic keratosis: Secondary | ICD-10-CM | POA: Diagnosis not present

## 2023-10-04 DIAGNOSIS — D225 Melanocytic nevi of trunk: Secondary | ICD-10-CM | POA: Diagnosis not present

## 2023-10-12 DIAGNOSIS — H401113 Primary open-angle glaucoma, right eye, severe stage: Secondary | ICD-10-CM | POA: Diagnosis not present

## 2023-10-12 DIAGNOSIS — H401121 Primary open-angle glaucoma, left eye, mild stage: Secondary | ICD-10-CM | POA: Diagnosis not present

## 2023-10-23 DIAGNOSIS — H401113 Primary open-angle glaucoma, right eye, severe stage: Secondary | ICD-10-CM | POA: Diagnosis not present

## 2023-10-23 DIAGNOSIS — H401121 Primary open-angle glaucoma, left eye, mild stage: Secondary | ICD-10-CM | POA: Diagnosis not present

## 2023-10-23 DIAGNOSIS — H02403 Unspecified ptosis of bilateral eyelids: Secondary | ICD-10-CM | POA: Diagnosis not present

## 2023-10-23 DIAGNOSIS — H57813 Brow ptosis, bilateral: Secondary | ICD-10-CM | POA: Diagnosis not present

## 2023-10-23 DIAGNOSIS — Z7901 Long term (current) use of anticoagulants: Secondary | ICD-10-CM | POA: Diagnosis not present

## 2023-10-25 ENCOUNTER — Ambulatory Visit: Payer: Medicare PPO | Admitting: Bariatrics

## 2023-10-25 ENCOUNTER — Encounter: Payer: Self-pay | Admitting: Bariatrics

## 2023-10-25 VITALS — BP 149/79 | HR 70 | Temp 98.1°F | Ht 63.0 in | Wt 150.0 lb

## 2023-10-25 DIAGNOSIS — E785 Hyperlipidemia, unspecified: Secondary | ICD-10-CM

## 2023-10-25 DIAGNOSIS — E669 Obesity, unspecified: Secondary | ICD-10-CM | POA: Diagnosis not present

## 2023-10-25 DIAGNOSIS — E7849 Other hyperlipidemia: Secondary | ICD-10-CM

## 2023-10-25 DIAGNOSIS — Z6826 Body mass index (BMI) 26.0-26.9, adult: Secondary | ICD-10-CM

## 2023-10-25 NOTE — Progress Notes (Signed)
WEIGHT SUMMARY AND BIOMETRICS  Weight Lost Since Last Visit: 0  Weight Gained Since Last Visit: 0   Vitals Temp: 98.1 F (36.7 C) BP: (!) 149/79 Pulse Rate: 70 SpO2: 97 %   Anthropometric Measurements Height: 5\' 3"  (1.6 m) Weight: 150 lb (68 kg) BMI (Calculated): 26.58 Weight at Last Visit: 150lb Weight Lost Since Last Visit: 0 Weight Gained Since Last Visit: 0 Starting Weight: 171lb Total Weight Loss (lbs): 21 lb (9.526 kg)   Body Composition  Body Fat %: 40.4 % Fat Mass (lbs): 60.6 lbs Muscle Mass (lbs): 84.8 lbs Total Body Water (lbs): 63.6 lbs Visceral Fat Rating : 11   Other Clinical Data Fasting: no Labs: no Today's Visit #: 12 Starting Date: 04/28/20    OBESITY Maureen Elliott is here to discuss her progress with her obesity treatment plan along with follow-up of her obesity related diagnoses.    Nutrition Plan: the Category 3 plan - 50% adherence.  Current exercise: none  Interim History:  Her weight remains the same. She has been able to maintain her weight.  She has been more stressed lately because she has been taking her granddaughter to her college classes.  She states that because of this she has not been going to the gym as much. Eating all of the food on the plan., Protein intake is as prescribed, Is not skipping meals, and Water intake is adequate.   Hunger is moderately controlled.  Cravings are moderately controlled.  Assessment/Plan:    Hyperlipidemia LDL is at goal. Medication(s): Lipitor Cardiovascular risk factors: advanced age (older than 25 for men, 56 for women), dyslipidemia, obesity (BMI >= 30 kg/m2), and sedentary lifestyle  Lab Results  Component Value Date   CHOL 117 10/16/2022   HDL 59 10/16/2022   LDLCALC 47 10/16/2022   TRIG 45 10/16/2022   CHOLHDL 2.0 10/16/2022   Lab Results  Component Value Date   ALT  23 10/16/2022   AST 20 04/17/2022   ALKPHOS 68 04/17/2022   BILITOT 0.4 04/17/2022   The ASCVD Risk score (Arnett DK, et al., 2019) failed to calculate for the following reasons:   The valid total cholesterol range is 130 to 320 mg/dL  Plan:  Continue statin.  Information sheet on healthy vs unhealthy fats.  Will avoid all trans fats.  Will read labels Will minimize saturated fats except the following: low fat meats in moderation, diary, and limited dark chocolate.  Discussed ways to help with her stress level. She will get back into the gym as soon as she can arrange it.      Generalized Obesity: Current BMI BMI (Calculated): 26.58   Maureen Elliott is currently in the action stage of change. As such, her goal is to maintain weight for now.  She has agreed to the Category 3 plan.  Exercise goals: Older adults should determine their level of  effort for physical activity relative to their level of fitness.    Behavioral modification strategies: increasing lean protein intake, decreasing simple carbohydrates , no meal skipping, meal planning , increase water intake, better snacking choices, planning for success, increasing vegetables, and increasing fiber rich foods.  Maureen Elliott has agreed to follow-up with our clinic in 4 weeks.     Objective:   VITALS: Per patient if applicable, see vitals. GENERAL: Alert and in no acute distress. CARDIOPULMONARY: No increased WOB. Speaking in clear sentences.  PSYCH: Pleasant and cooperative. Speech normal rate and rhythm. Affect is appropriate. Insight and judgement are appropriate. Attention is focused, linear, and appropriate.  NEURO: Oriented as arrived to appointment on time with no prompting.   Attestation Statements:   This was prepared with the assistance of Engineer, civil (consulting).  Occasional wrong-word or sound-a-like substitutions may have occurred due to the inherent limitations of voice recognition    Maureen Capra, DO

## 2023-11-02 ENCOUNTER — Telehealth (INDEPENDENT_AMBULATORY_CARE_PROVIDER_SITE_OTHER): Payer: Self-pay | Admitting: Otolaryngology

## 2023-11-02 NOTE — Telephone Encounter (Signed)
 Confirmed 11/05/23 appt time and address with patient.

## 2023-11-05 ENCOUNTER — Encounter (INDEPENDENT_AMBULATORY_CARE_PROVIDER_SITE_OTHER): Payer: Self-pay

## 2023-11-05 ENCOUNTER — Ambulatory Visit (INDEPENDENT_AMBULATORY_CARE_PROVIDER_SITE_OTHER): Payer: Medicare PPO | Admitting: Otolaryngology

## 2023-11-05 VITALS — BP 148/77 | HR 61 | Ht 63.0 in | Wt 151.0 lb

## 2023-11-05 DIAGNOSIS — H608X3 Other otitis externa, bilateral: Secondary | ICD-10-CM | POA: Insufficient documentation

## 2023-11-05 DIAGNOSIS — H6121 Impacted cerumen, right ear: Secondary | ICD-10-CM | POA: Insufficient documentation

## 2023-11-05 DIAGNOSIS — H903 Sensorineural hearing loss, bilateral: Secondary | ICD-10-CM | POA: Insufficient documentation

## 2023-11-05 NOTE — Progress Notes (Signed)
 Patient ID: Maureen Elliott, female   DOB: 25-Aug-1946, 78 y.o.   MRN: [id number]  Follow-up: Chronic eczematous otitis externa, bilateral hearing loss  HPI: The patient is a 78 year old female who returns today for her follow-up evaluation.  The patient was previously seen for chronic itchy ears and bilateral hearing loss.  At her last visit 1 year ago, she was noted to have bilateral eczematous otitis externa and bilateral high-frequency sensorineural hearing loss.  She was treated with Elocon cream and hearing amplifications.  The patient returns today reporting no significant change in her hearing.  She currently wears bilateral hearing aids.  Her eczematous otitis externa is under controlled with the use of Elocon cream.  Currently she denies any otalgia or otorrhea.  Exam: General: Communicates without difficulty, well nourished, no acute distress. Head: Normocephalic, no evidence injury, no tenderness, facial buttresses intact without stepoff. Face/sinus: No tenderness to palpation and percussion. Facial movement is normal and symmetric. Eyes: PERRL, EOMI. No scleral icterus, conjunctivae clear. Neuro: CN II exam reveals vision grossly intact.  No nystagmus at any point of gaze. EAC: Eczematous changes are noted in both ear canals.  Right ear cerumen impaction noted.  Nose: External evaluation reveals normal support and skin without lesions.  Dorsum is intact.  Anterior rhinoscopy reveals pink mucosa over anterior aspect of inferior turbinates and intact septum.  No purulence noted. Oral:  Oral cavity and oropharynx are intact, symmetric, without erythema or edema.  Mucosa is moist without lesions. Neck: Full range of motion without pain.  There is no significant lymphadenopathy.  No masses palpable.  Thyroid  bed within normal limits to palpation.  Parotid glands and submandibular glands equal bilaterally without mass.  Trachea is midline. Neuro:  CN 2-12 grossly intact.   Procedure: Right ear  cerumen disimpaction Anesthesia: None Description: Under the operating microscope, the cerumen is carefully removed with a combination of cerumen currette, alligator forceps, and suction catheters.  After the cerumen is removed, the TMs are noted to be normal.  Eczematous changes are noted within the ear canals.  No mass, erythema, or lesions. The patient tolerated the procedure well.    Assessment: 1.  Right ear cerumen impaction.  After the cerumen disimpaction procedure, both tympanic membranes and the spaces are noted to be normal. 2.  Bilateral chronic eczematous otitis externa.  Her symptoms are currently under control with the use of Elocon cream. 3.  Subjectively stable bilateral sensorineural hearing loss.  Plan: 1.  Otomicroscopy with right ear cerumen disimpaction. 2.  Continue the use of Elocon cream as needed. 3.  Continue the use of her hearing aids. 4.  The patient will return for reevaluation in 1 year.

## 2023-11-09 DIAGNOSIS — R030 Elevated blood-pressure reading, without diagnosis of hypertension: Secondary | ICD-10-CM | POA: Diagnosis not present

## 2023-11-09 DIAGNOSIS — Z6827 Body mass index (BMI) 27.0-27.9, adult: Secondary | ICD-10-CM | POA: Diagnosis not present

## 2023-11-19 ENCOUNTER — Telehealth: Payer: Self-pay | Admitting: *Deleted

## 2023-11-19 DIAGNOSIS — H02403 Unspecified ptosis of bilateral eyelids: Secondary | ICD-10-CM | POA: Diagnosis not present

## 2023-11-19 DIAGNOSIS — H57813 Brow ptosis, bilateral: Secondary | ICD-10-CM | POA: Diagnosis not present

## 2023-11-19 NOTE — Telephone Encounter (Signed)
   Pre-operative Risk Assessment    Patient Name: Maureen Elliott  DOB: 09-24-1946 MRN: [id number]   Date of last office visit: 07/30/23 DR. TURNER Date of next office visit: NONE    Request for Surgical Clearance    Procedure:   UPPER LID BLEPHAROPLASTY  & BROW LIFT  Date of Surgery:  Clearance 12/05/23                                Surgeon:  DR. Quitman Livings Surgeon's Group or Practice Name:  [facility]  Phone number:  [phone] Fax number:  [phone]   Type of Clearance Requested:   - Medical  - Pharmacy:  Hold Rivaroxaban (Xarelto) ( HOLD x 4 DAYS PRIOR TO PROCEDURE; RESUME 1.5 DAYS S/P PROCEDURE)   Type of Anesthesia:  Not Indicated   Additional requests/questions:    Elpidio Anis   11/19/2023, 6:11 PM

## 2023-11-20 NOTE — Telephone Encounter (Signed)
 Pharmacy please advise on holding Xarelto prior to Upper Lid Blepharoplasty with Brow Lift  scheduled for 12/05/2023. Thank you.

## 2023-11-22 ENCOUNTER — Telehealth: Payer: Self-pay | Admitting: *Deleted

## 2023-11-22 NOTE — Telephone Encounter (Signed)
 Pt scheduled for a tele visit 11/28/23 10:40.  Consent on file, medications reconciled.     Patient Consent for Virtual Visit        Maureen Elliott has provided verbal consent on 11/22/2023 for a virtual visit (video or telephone).   CONSENT FOR VIRTUAL VISIT FOR:  Maureen Elliott  By participating in this virtual visit I agree to the following:  I hereby voluntarily request, consent and authorize [facility] and its employed or contracted physicians, physician assistants, nurse practitioners or other licensed health care professionals (the Practitioner), to provide me with telemedicine health care services (the "Services") as deemed necessary by the treating Practitioner. I acknowledge and consent to receive the Services by the Practitioner via telemedicine. I understand that the telemedicine visit will involve communicating with the Practitioner through live audiovisual communication technology and the disclosure of certain medical information by electronic transmission. I acknowledge that I have been given the opportunity to request an in-person assessment or other available alternative prior to the telemedicine visit and am voluntarily participating in the telemedicine visit.  I understand that I have the right to withhold or withdraw my consent to the use of telemedicine in the course of my care at any time, without affecting my right to future care or treatment, and that the Practitioner or I may terminate the telemedicine visit at any time. I understand that I have the right to inspect all information obtained and/or recorded in the course of the telemedicine visit and may receive copies of available information for a reasonable fee.  I understand that some of the potential risks of receiving the Services via telemedicine include:  Delay or interruption in medical evaluation due to technological equipment failure or disruption; Information transmitted may not be sufficient (e.g.  poor resolution of images) to allow for appropriate medical decision making by the Practitioner; and/or  In rare instances, security protocols could fail, causing a breach of personal health information.  Furthermore, I acknowledge that it is my responsibility to provide information about my medical history, conditions and care that is complete and accurate to the best of my ability. I acknowledge that Practitioner's advice, recommendations, and/or decision may be based on factors not within their control, such as incomplete or inaccurate data provided by me or distortions of diagnostic images or specimens that may result from electronic transmissions. I understand that the practice of medicine is not an exact science and that Practitioner makes no warranties or guarantees regarding treatment outcomes. I acknowledge that a copy of this consent can be made available to me via my patient portal [facility] MyChart), or I can request a printed copy by calling the office of [facility].    I understand that my insurance will be billed for this visit.   I have read or had this consent read to me. I understand the contents of this consent, which adequately explains the benefits and risks of the Services being provided via telemedicine.  I have been provided ample opportunity to ask questions regarding this consent and the Services and have had my questions answered to my satisfaction. I give my informed consent for the services to be provided through the use of telemedicine in my medical care

## 2023-11-22 NOTE — Telephone Encounter (Signed)
 Pt scheduled for a tele visit 11/28/23 10:40.  Consent on file, medications reconciled.

## 2023-11-22 NOTE — Telephone Encounter (Signed)
 Patient with diagnosis of afib on Xarelto for anticoagulation.    Procedure: UPPER LID BLEPHAROPLASTY & BROW LIFT  Date of procedure: 12/05/23   CHA2DS2-VASc Score = 4   This indicates a 4.8% annual risk of stroke. The patient's score is based upon: CHF History: 0 HTN History: 0 Diabetes History: 0 Stroke History: 0 Vascular Disease History: 1 Age Score: 2 Gender Score: 1      Patient with hx of left leg DVT   CrCl EGFR 75 ml/min Platelet count not available but not needed for clinical decision  Per office protocol, patient can hold Xarelto for 2 days prior to procedure.    **This guidance is not considered finalized until pre-operative APP has relayed final recommendations.**

## 2023-11-22 NOTE — Telephone Encounter (Signed)
   Name: Maureen Elliott  DOB: [birth year]  MRN: [id number]  Primary Cardiologist: Armanda Magic, MD   Preoperative team, please contact this patient and set up a phone call appointment for further preoperative risk assessment. Please obtain consent and complete medication review. Thank you for your help.  I confirm that guidance regarding antiplatelet and oral anticoagulation therapy has been completed and, if necessary, noted below.  Per office protocol, patient can hold Xarelto for 2 days prior to procedure.  Please resume Xarelto as soon as possible postprocedure, at the discretion of the surgeon.   I also confirmed the patient resides in the state of [place]. As per [facility] Medical Board telemedicine laws, the patient must reside in the state in which the provider is licensed.   Joylene Grapes, NP 11/22/2023, 9:05 AM [facility]

## 2023-11-28 ENCOUNTER — Ambulatory Visit: Payer: Medicare PPO | Attending: Nurse Practitioner

## 2023-11-28 DIAGNOSIS — Z01810 Encounter for preprocedural cardiovascular examination: Secondary | ICD-10-CM

## 2023-11-28 NOTE — Progress Notes (Signed)
 Virtual Visit via Telephone Note   Because of [name] co-morbid illnesses, she is at least at moderate risk for complications without adequate follow up.  This format is felt to be most appropriate for this patient at this time.  Due to technical limitations with video connection Web designer), today's appointment will be conducted as an audio only telehealth visit, and Maureen Elliott verbally agreed to proceed in this manner.   All issues noted in this document were discussed and addressed.  No physical exam could be performed with this format.  Evaluation Performed:  Preoperative cardiovascular risk assessment _____________   Date:  11/28/2023   Patient ID:  Maureen Elliott, DOB Aug 13, 1946, MRN [id number] Patient Location:  Home Provider location:   Office  Primary Care Provider:  Sigmund Hazel, MD Primary Cardiologist:  Armanda Magic, MD  Chief Complaint / Patient Profile   78 y.o. y/o female with a h/o paroxysmal AF, lower extremity DVT, HLD, nonobstructive CAD who is pending upper lid blepharoplasty and brow lift and presents today for telephonic preoperative cardiovascular risk assessment.  History of Present Illness    Maureen Elliott is a 78 y.o. female who presents via audio/video conferencing for a telehealth visit today.  Pt was last seen in cardiology clinic on 07/30/2023 by Dr. Mayford Knife.  At that time SARIA HARAN was doing well with no new cardiac complaints.  The patient is now pending procedure as outlined above. Since her last visit, she has been doing well with no new cardiac complaints.  She reports suffering a fall last summer that required an MRI of the head that was normal.  She has been active for years with Silver sneakers and recently has decreased her activity level due to her husband's upcoming surgery and her procedure.  She had no new cardiac complaints at this time.  She  She denies chest pain, shortness of breath, lower extremity edema,  fatigue, palpitations, melena, hematuria, hemoptysis, diaphoresis, weakness, presyncope, syncope, orthopnea, and PND.   Past Medical History    Past Medical History:  Diagnosis Date   CAD (coronary artery disease), native coronary artery    minimal < 25% plaque LAD by coronary CTA 2/23   Complication of anesthesia    be careful with intubation due to previous cervical neck surgery   Cystocele    Diverticulitis    Diverticulosis    DVT (deep venous thrombosis) (HCC)    left leg   Gallbladder problem    Gestational diabetes    Glaucoma    Hemorrhoids internal and external   High cholesterol    History of gestational diabetes yrs ago   Joint pain    Lactose intolerance    Nocturia    Osteoarthritis    PAF (paroxysmal atrial fibrillation) (HCC)    Partial hearing loss    PONV (postoperative nausea and vomiting) 2013   n/v after phenergan, injectable phenergan helps with nausea   Prediabetes    Past Surgical History:  Procedure Laterality Date   ABDOMINAL HYSTERECTOMY  2013   ovaries removed    BLADDER SURGERY     BREAST BIOPSY Bilateral 2001   cervical fusion C 5, C 6 and C 7  2004   colonscopy  feb 2015   blood in stool, just was hemorrhoids   CYSTOCELE REPAIR N/A 01/13/2014   Procedure: CYSTOSCOPY, REPAIR CYSTOCELE VAULT PROLAPSE/GRAFT;  Surgeon: Martina Sinner, MD;  Location: [facility];  Service: Urology;  Laterality: N/A;   EYE  SURGERY Bilateral 8 yrs ago   lens replacement for cataract   GLAUCOMA SURGERY     SEPTOPLASTY Bilateral 03/01/2020   Procedure: NASAL SEPTOPLASTY;  Surgeon: Newman Pies, MD;  Location: [facility];  Service: ENT;  Laterality: Bilateral;   tummy tuck with mesh  6 yrs ago   large abdominal is present    Allergies  Allergies  Allergen Reactions   Amoxicillin Diarrhea    Normal adverse effect   Attapulgite Diarrhea   Caffeine Other (See Comments)    Avoid due to glaucoma   Dorzolamide Other (See Comments)    Severe eye  irritation   Fish Oil     Other reaction(s): due to xarelto   Keflex [Cephalexin] Diarrhea    Normal adverse effect   Levaquin [Levofloxacin] Other (See Comments)    Joint pain and stiffness   Maxitrol [Neomycin-Polymyxin-Dexameth] Other (See Comments)    Burns eyes   Methotrexate Derivatives Other (See Comments)    Hearing damage   Nsaids     Other reaction(s): due to being on Xarelto   Prednisone Other (See Comments)    Avoid due to glaucoma   Restasis [Cyclosporine] Other (See Comments)    Burns eyes   Rofecoxib     Other reaction(s): Heart palpitations / PVC's   Valtrex [Valacyclovir] Swelling and Other (See Comments)    Face swelling, headache on left side, increased eye pressure    Home Medications    Prior to Admission medications   Medication Sig Start Date End Date Taking? Authorizing Provider  acetaminophen (TYLENOL) 325 MG tablet Take 650 mg by mouth every 6 (six) hours as needed for mild pain, fever or headache.    [provider]  atorvastatin (LIPITOR) 40 MG tablet TAKE 1 TABLET BY MOUTH EVERY DAY 05/15/23   Quintella Reichert, MD  brimonidine (ALPHAGAN) 0.2 % ophthalmic solution Place 1 drop into both eyes 3 (three) times daily.    [provider]  CALCIUM 600 1500 (600 Ca) MG TABS tablet SMARTSIG:1 Tablet(s) By Mouth 10/17/21   [provider]  Cholecalciferol (VITAMIN D) 50 MCG (2000 UT) CAPS Take 1 capsule (2,000 Units total) by mouth daily. 03/31/21   Whitmire, Dawn, FNP  ezetimibe (ZETIA) 10 MG tablet TAKE 1 TABLET BY MOUTH EVERY DAY 08/14/23   Quintella Reichert, MD  Glucosamine Sulfate 500 MG TABS Take 1 tablet by mouth daily. 03/13/22   [provider]  ipratropium (ATROVENT) 0.06 % nasal spray Place 2 sprays into both nostrils in the morning and at bedtime. 05/02/22   [provider]  melatonin 5 MG TABS Take 5 mg by mouth at bedtime.    [provider]  meloxicam (MOBIC) 7.5 MG tablet Take 7.5 mg by mouth 2 (two)  times daily. 03/05/23   [provider]  Multiple Vitamin (MULTIVITAMIN WITH MINERALS) TABS tablet Take 1 tablet by mouth daily.    [provider]  Nutritional Supplements (GLUCOSAMINE COMPLEX PO) Take by mouth.    [provider]  pilocarpine (PILOCAR) 1 % ophthalmic solution Place 1 drop into both eyes 3 (three) times daily. 04/10/20   [provider]  Polyethyl Glycol-Propyl Glycol 0.4-0.3 % SOLN Apply to eye as needed.    [provider]  timolol (TIMOPTIC) 0.5 % ophthalmic solution Place 1 drop into both eyes 2 (two) times daily. 01/24/21   [provider]  XARELTO 20 MG TABS tablet TAKE 1 TABLET BY MOUTH EVERY DAY 09/24/23  Quintella Reichert, MD    Physical Exam    Vital Signs:  JOCILYN TREGO does not have vital signs available for review today.  Given telephonic nature of communication, physical exam is limited. AAOx3. NAD. Normal affect.  Speech and respirations are unlabored.  Accessory Clinical Findings    None  Assessment & Plan    1.  Preoperative Cardiovascular Risk Assessment: -Patient's RCRI score is 0.9%  The patient affirms she has been doing well without any new cardiac symptoms. They are able to achieve 7 METS without cardiac limitations. Therefore, based on ACC/AHA guidelines, the patient would be at acceptable risk for the planned procedure without further cardiovascular testing. The patient was advised that if she develops new symptoms prior to surgery to contact our office to arrange for a follow-up visit, and she verbalized understanding.   The patient was advised that if she develops new symptoms prior to surgery to contact our office to arrange for a follow-up visit, and she verbalized understanding.  Patient can hold Xarelto 2 days prior to procedure  A copy of this note will be routed to requesting surgeon.  Time:   Today, I have spent 8 minutes with the patient with telehealth technology discussing  medical history, symptoms, and management plan.     Napoleon Form, Leodis Rains, NP  11/28/2023, 7:29 AM

## 2023-11-29 ENCOUNTER — Encounter: Payer: Self-pay | Admitting: Bariatrics

## 2023-11-29 ENCOUNTER — Ambulatory Visit: Payer: Medicare PPO | Admitting: Bariatrics

## 2023-11-29 VITALS — BP 130/75 | HR 68 | Temp 98.1°F | Ht 63.0 in | Wt 149.0 lb

## 2023-11-29 DIAGNOSIS — E669 Obesity, unspecified: Secondary | ICD-10-CM | POA: Diagnosis not present

## 2023-11-29 DIAGNOSIS — Z6826 Body mass index (BMI) 26.0-26.9, adult: Secondary | ICD-10-CM | POA: Diagnosis not present

## 2023-11-29 DIAGNOSIS — E7800 Pure hypercholesterolemia, unspecified: Secondary | ICD-10-CM

## 2023-11-29 NOTE — Progress Notes (Signed)
 WEIGHT SUMMARY AND BIOMETRICS  Weight Lost Since Last Visit: 1lb  Weight Gained Since Last Visit: 0lb   Vitals Temp: 98.1 F (36.7 C) BP: 130/75 Pulse Rate: 68 SpO2: 97 %   Anthropometric Measurements Height: 5\' 3"  (1.6 m) Weight: 149 lb (67.6 kg) BMI (Calculated): 26.4 Weight at Last Visit: 150lb Weight Lost Since Last Visit: 1lb Weight Gained Since Last Visit: 0lb Starting Weight: 171lb Total Weight Loss (lbs): 22 lb (9.979 kg)   Body Composition  Body Fat %: 39.8 % Fat Mass (lbs): 59.4 lbs Muscle Mass (lbs): 85 lbs Total Body Water (lbs): 61.4 lbs Visceral Fat Rating : 11   Other Clinical Data Fasting: No Labs: No Today's Visit #: 69 Starting Date: 04/28/20    OBESITY Maureen Elliott is here to discuss her progress with her obesity treatment plan along with follow-up of her obesity related diagnoses.    Nutrition Plan: the Category 3 plan - 90% adherence.  Current exercise: none  Interim History:  Her weight is down 1 lb.  Eating all of the food on the plan., Protein intake is as prescribed, Is not skipping meals, and Water intake is adequate.   Hunger is well controlled.  Cravings are well controlled.  Assessment/Plan:   Hypercholesterolemia:  LDL is at goal. Medication(s): Lipitor  Cardiovascular risk factors: advanced age (older than 54 for men, 60 for women), dyslipidemia, obesity (BMI >= 30 kg/m2), and sedentary lifestyle  Lab Results  Component Value Date   CHOL 117 10/16/2022   HDL 59 10/16/2022   LDLCALC 47 10/16/2022   TRIG 45 10/16/2022   CHOLHDL 2.0 10/16/2022   Lab Results  Component Value Date   ALT 23 10/16/2022   AST 20 04/17/2022   ALKPHOS 68 04/17/2022   BILITOT 0.4 04/17/2022   The ASCVD Risk score (Arnett DK, et al., 2019) failed to calculate for the following reasons:   The valid total cholesterol range is 130  to 320 mg/dL  Plan:  Continue statin.  Discussed healthy fats.  Will avoid all trans fats.  Will read labels Will minimize saturated fats except the following: low fat meats in moderation, diary, and limited dark chocolate.  Will continue to cook more at home. .     Generalized Obesity: Current BMI BMI (Calculated): 26.4    Maureen Elliott is currently in the action stage of change. As such, her goal is to maintain her weight and keep her muscle.  She has agreed to the Category 3 plan.  Exercise goals: Older adults should follow the adult guidelines. When older adults cannot meet the adult guidelines, they should be as physically active as their abilities and conditions will allow.   Behavioral modification strategies: increasing lean protein intake, no meal skipping, meal planning , increase water intake, better snacking choices, planning for success, and measure portion sizes.  Maureen Elliott has agreed to follow-up with  our clinic in about 6 to 8 weeks as she is scheduled for surgery, and then her spouse needs surgery.      Objective:   VITALS: Per patient if applicable, see vitals. GENERAL: Alert and in no acute distress. CARDIOPULMONARY: No increased WOB. Speaking in clear sentences.  PSYCH: Pleasant and cooperative. Speech normal rate and rhythm. Affect is appropriate. Insight and judgement are appropriate. Attention is focused, linear, and appropriate.  NEURO: Oriented as arrived to appointment on time with no prompting.   Attestation Statements:   This was prepared with the assistance of Engineer, civil (consulting).  Occasional wrong-word or sound-a-like substitutions may have occurred due to the inherent limitations of voice recognition   Corinna Capra, DO

## 2023-12-05 DIAGNOSIS — H02833 Dermatochalasis of right eye, unspecified eyelid: Secondary | ICD-10-CM | POA: Diagnosis not present

## 2023-12-05 DIAGNOSIS — I480 Paroxysmal atrial fibrillation: Secondary | ICD-10-CM | POA: Diagnosis not present

## 2023-12-05 DIAGNOSIS — Z7901 Long term (current) use of anticoagulants: Secondary | ICD-10-CM | POA: Diagnosis not present

## 2023-12-05 DIAGNOSIS — H57813 Brow ptosis, bilateral: Secondary | ICD-10-CM | POA: Diagnosis not present

## 2023-12-05 DIAGNOSIS — H02403 Unspecified ptosis of bilateral eyelids: Secondary | ICD-10-CM | POA: Diagnosis not present

## 2023-12-05 DIAGNOSIS — Z79899 Other long term (current) drug therapy: Secondary | ICD-10-CM | POA: Diagnosis not present

## 2023-12-05 DIAGNOSIS — I2510 Atherosclerotic heart disease of native coronary artery without angina pectoris: Secondary | ICD-10-CM | POA: Diagnosis not present

## 2023-12-10 DIAGNOSIS — Z09 Encounter for follow-up examination after completed treatment for conditions other than malignant neoplasm: Secondary | ICD-10-CM | POA: Diagnosis not present

## 2023-12-20 DIAGNOSIS — Z09 Encounter for follow-up examination after completed treatment for conditions other than malignant neoplasm: Secondary | ICD-10-CM | POA: Diagnosis not present

## 2023-12-20 DIAGNOSIS — H18452 Nodular corneal degeneration, left eye: Secondary | ICD-10-CM | POA: Diagnosis not present

## 2023-12-20 DIAGNOSIS — H04129 Dry eye syndrome of unspecified lacrimal gland: Secondary | ICD-10-CM | POA: Diagnosis not present

## 2023-12-20 DIAGNOSIS — H04123 Dry eye syndrome of bilateral lacrimal glands: Secondary | ICD-10-CM | POA: Diagnosis not present

## 2023-12-24 DIAGNOSIS — H16212 Exposure keratoconjunctivitis, left eye: Secondary | ICD-10-CM | POA: Diagnosis not present

## 2023-12-24 DIAGNOSIS — H02206 Unspecified lagophthalmos left eye, unspecified eyelid: Secondary | ICD-10-CM | POA: Diagnosis not present

## 2023-12-24 DIAGNOSIS — H18452 Nodular corneal degeneration, left eye: Secondary | ICD-10-CM | POA: Diagnosis not present

## 2023-12-31 DIAGNOSIS — H02206 Unspecified lagophthalmos left eye, unspecified eyelid: Secondary | ICD-10-CM | POA: Diagnosis not present

## 2023-12-31 DIAGNOSIS — H16212 Exposure keratoconjunctivitis, left eye: Secondary | ICD-10-CM | POA: Diagnosis not present

## 2023-12-31 DIAGNOSIS — H18452 Nodular corneal degeneration, left eye: Secondary | ICD-10-CM | POA: Diagnosis not present

## 2023-12-31 DIAGNOSIS — H02204 Unspecified lagophthalmos left upper eyelid: Secondary | ICD-10-CM | POA: Diagnosis not present

## 2023-12-31 DIAGNOSIS — I480 Paroxysmal atrial fibrillation: Secondary | ICD-10-CM | POA: Diagnosis not present

## 2024-01-08 DIAGNOSIS — Z09 Encounter for follow-up examination after completed treatment for conditions other than malignant neoplasm: Secondary | ICD-10-CM | POA: Diagnosis not present

## 2024-01-10 DIAGNOSIS — H401121 Primary open-angle glaucoma, left eye, mild stage: Secondary | ICD-10-CM | POA: Diagnosis not present

## 2024-01-10 DIAGNOSIS — H401113 Primary open-angle glaucoma, right eye, severe stage: Secondary | ICD-10-CM | POA: Diagnosis not present

## 2024-01-29 DIAGNOSIS — E663 Overweight: Secondary | ICD-10-CM | POA: Diagnosis not present

## 2024-01-29 DIAGNOSIS — H9113 Presbycusis, bilateral: Secondary | ICD-10-CM | POA: Diagnosis not present

## 2024-01-29 DIAGNOSIS — I480 Paroxysmal atrial fibrillation: Secondary | ICD-10-CM | POA: Diagnosis not present

## 2024-01-29 DIAGNOSIS — H401130 Primary open-angle glaucoma, bilateral, stage unspecified: Secondary | ICD-10-CM | POA: Diagnosis not present

## 2024-01-29 DIAGNOSIS — Z6827 Body mass index (BMI) 27.0-27.9, adult: Secondary | ICD-10-CM | POA: Diagnosis not present

## 2024-01-29 DIAGNOSIS — Z1331 Encounter for screening for depression: Secondary | ICD-10-CM | POA: Diagnosis not present

## 2024-01-29 DIAGNOSIS — M8580 Other specified disorders of bone density and structure, unspecified site: Secondary | ICD-10-CM | POA: Diagnosis not present

## 2024-01-29 DIAGNOSIS — Z0000 Encounter for general adult medical examination without abnormal findings: Secondary | ICD-10-CM | POA: Diagnosis not present

## 2024-01-29 DIAGNOSIS — I2510 Atherosclerotic heart disease of native coronary artery without angina pectoris: Secondary | ICD-10-CM | POA: Diagnosis not present

## 2024-02-04 DIAGNOSIS — H401121 Primary open-angle glaucoma, left eye, mild stage: Secondary | ICD-10-CM | POA: Diagnosis not present

## 2024-02-04 DIAGNOSIS — H524 Presbyopia: Secondary | ICD-10-CM | POA: Diagnosis not present

## 2024-02-04 DIAGNOSIS — H18452 Nodular corneal degeneration, left eye: Secondary | ICD-10-CM | POA: Diagnosis not present

## 2024-02-04 DIAGNOSIS — H52203 Unspecified astigmatism, bilateral: Secondary | ICD-10-CM | POA: Diagnosis not present

## 2024-02-04 DIAGNOSIS — H401113 Primary open-angle glaucoma, right eye, severe stage: Secondary | ICD-10-CM | POA: Diagnosis not present

## 2024-02-05 DIAGNOSIS — H04123 Dry eye syndrome of bilateral lacrimal glands: Secondary | ICD-10-CM | POA: Diagnosis not present

## 2024-02-05 DIAGNOSIS — Z09 Encounter for follow-up examination after completed treatment for conditions other than malignant neoplasm: Secondary | ICD-10-CM | POA: Diagnosis not present

## 2024-02-05 DIAGNOSIS — H18452 Nodular corneal degeneration, left eye: Secondary | ICD-10-CM | POA: Diagnosis not present

## 2024-02-05 DIAGNOSIS — H04129 Dry eye syndrome of unspecified lacrimal gland: Secondary | ICD-10-CM | POA: Diagnosis not present

## 2024-02-11 ENCOUNTER — Encounter: Payer: Self-pay | Admitting: Bariatrics

## 2024-02-11 ENCOUNTER — Ambulatory Visit: Admitting: Bariatrics

## 2024-02-11 VITALS — BP 123/72 | HR 75 | Temp 97.9°F | Ht 63.0 in | Wt 151.0 lb

## 2024-02-11 DIAGNOSIS — E669 Obesity, unspecified: Secondary | ICD-10-CM | POA: Diagnosis not present

## 2024-02-11 DIAGNOSIS — E88819 Insulin resistance, unspecified: Secondary | ICD-10-CM

## 2024-02-11 DIAGNOSIS — Z6826 Body mass index (BMI) 26.0-26.9, adult: Secondary | ICD-10-CM | POA: Diagnosis not present

## 2024-02-11 NOTE — Progress Notes (Signed)
 WEIGHT SUMMARY AND BIOMETRICS  Weight Lost Since Last Visit: 0  Weight Gained Since Last Visit: 2lb   Vitals Temp: 97.9 F (36.6 C) BP: 123/72 Pulse Rate: 75 SpO2: 97 %   Anthropometric Measurements Height: 5\' 3"  (1.6 m) Weight: 151 lb (68.5 kg) BMI (Calculated): 26.76 Weight at Last Visit: 149lb Weight Lost Since Last Visit: 0 Weight Gained Since Last Visit: 2lb Starting Weight: 171 Total Weight Loss (lbs): 20 lb (9.072 kg)   Body Composition  Body Fat %: 39.3 % Fat Mass (lbs): 59.6 lbs Muscle Mass (lbs): 87.4 lbs Total Body Water (lbs): 62.4 lbs Visceral Fat Rating : 11   Other Clinical Data Fasting: no Labs: no Today's Visit #: 85 Starting Date: 04/28/20    OBESITY Maureen Elliott is here to discuss her progress with her obesity treatment plan along with follow-up of her obesity related diagnoses.    Nutrition Plan: the Category 2 plan - 0% adherence.  Current exercise: none  Interim History:  She is up 2 lbs since her last visit. She has had 2 eye surgeries under anesthesia, her spouse had surgery, and her son-in-law had a stroke.  Eating all of the food on the plan., Is not skipping meals, and Water intake is adequate.  Hunger is moderately controlled.  Cravings are moderately controlled.  Assessment/Plan:   Insulin  Resistance Maureen Elliott has had elevated fasting insulin  readings. Goal is HgbA1c < 5.7, fasting insulin  at l0 or less, and preferably at 5.  She denies polyphagia. Medication(s): none Lab Results  Component Value Date   HGBA1C 5.3 10/04/2021   Lab Results  Component Value Date   INSULIN  2.8 11/09/2020   INSULIN  5.1 08/05/2020   INSULIN  7.6 04/28/2020    Plan  Will work on the agreed upon plan. Will minimize refined carbohydrates ( sweets and starches), and focus more on complex carbohydrates.  Increase the micronutrients found  in leafy greens, which include magnesium, polyphenols, and vitamin C which have been postulated to help with insulin  sensitivity. Minimize "fast food" and cook more meals at home.  Increase fiber to 25 to 30 grams daily.  High fiber sheet given.  Will get back on track.   She is up 2 lbs since her last visit.    Generalized Obesity: Current BMI BMI (Calculated): 26.76   Maureen Elliott is currently in the action stage of change. As such, her goal is to continue with weight loss efforts.  She has agreed to the Category 2 plan.  Exercise goals: Older adults should determine their level of effort for physical activity relative to their level of fitness.   Behavioral modification strategies: increasing lean protein intake, no meal skipping, meal planning , increase water intake, better snacking choices, increasing vegetables, get rid of junk food in the home, avoiding temptations, keep healthy foods in the home, and measure portion sizes.  Maureen Elliott has agreed  to follow-up with our clinic in 4 weeks.    Objective:   VITALS: Per patient if applicable, see vitals. GENERAL: Alert and in no acute distress. CARDIOPULMONARY: No increased WOB. Speaking in clear sentences.  PSYCH: Pleasant and cooperative. Speech normal rate and rhythm. Affect is appropriate. Insight and judgement are appropriate. Attention is focused, linear, and appropriate.  NEURO: Oriented as arrived to appointment on time with no prompting.   Attestation Statements:   This was prepared with the assistance of Engineer, civil (consulting).  Occasional wrong-word or sound-a-like substitutions may have occurred due to the inherent limitations of voice recognition   Kirk Peper, DO

## 2024-03-11 ENCOUNTER — Ambulatory Visit: Admitting: Bariatrics

## 2024-03-11 ENCOUNTER — Encounter: Payer: Self-pay | Admitting: Bariatrics

## 2024-03-11 VITALS — BP 136/75 | HR 59 | Temp 98.2°F | Ht 63.0 in | Wt 149.0 lb

## 2024-03-11 DIAGNOSIS — Z6826 Body mass index (BMI) 26.0-26.9, adult: Secondary | ICD-10-CM

## 2024-03-11 DIAGNOSIS — E669 Obesity, unspecified: Secondary | ICD-10-CM | POA: Diagnosis not present

## 2024-03-11 DIAGNOSIS — E7800 Pure hypercholesterolemia, unspecified: Secondary | ICD-10-CM

## 2024-03-11 DIAGNOSIS — E785 Hyperlipidemia, unspecified: Secondary | ICD-10-CM | POA: Diagnosis not present

## 2024-03-11 NOTE — Progress Notes (Signed)
 WEIGHT SUMMARY AND BIOMETRICS  Weight Lost Since Last Visit: 2lb  Weight Gained Since Last Visit: 0   Vitals Temp: 98.2 F (36.8 C) BP: 136/75 Pulse Rate: (!) 59 SpO2: 98 %   Anthropometric Measurements Height: 5' 3 (1.6 m) Weight: 149 lb (67.6 kg) BMI (Calculated): 26.4 Weight at Last Visit: 151lb Weight Lost Since Last Visit: 2lb Weight Gained Since Last Visit: 0 Starting Weight: 171lb Total Weight Loss (lbs): 22 lb (9.979 kg)   Body Composition  Body Fat %: 39.8 % Fat Mass (lbs): 59.6 lbs Muscle Mass (lbs): 85.6 lbs Total Body Water (lbs): 62.4 lbs Visceral Fat Rating : 11   Other Clinical Data Fasting: no Labs: no Today's Visit #: 37 Starting Date: 04/28/20    OBESITY Taleah is here to discuss her progress with her obesity treatment plan along with follow-up of her obesity related diagnoses.    Nutrition Plan: the Category 2 plan - 0% adherence.  Current exercise: goes to the gym/silver sneakers  Interim History:  She is down 2 lbs since her last visit.  She states that she has had some mild memory issues since she had her last surgery.  She is sleeping relatively well but does get up once or twice nightly. Eating all of the food on the plan., Protein intake is as prescribed, Is not skipping meals, Water intake is adequate., Denies polyphagia, and Denies excessive cravings.  Hunger is moderately controlled.  Cravings are moderately controlled.  Assessment/Plan:   Hyperlipidemia LDL is at goal. Medication(s): none Cardiovascular risk factors: advanced age (older than 51 for men, 59 for women), dyslipidemia, obesity (BMI >= 30 kg/m2), and sedentary lifestyle  Lab Results  Component Value Date   CHOL 117 10/16/2022   HDL 59 10/16/2022   LDLCALC 47 10/16/2022   TRIG 45 10/16/2022   CHOLHDL 2.0 10/16/2022   Lab Results  Component  Value Date   ALT 23 10/16/2022   AST 20 04/17/2022   ALKPHOS 68 04/17/2022   BILITOT 0.4 04/17/2022   The ASCVD Risk score (Arnett DK, et al., 2019) failed to calculate for the following reasons:   The valid total cholesterol range is 130 to 320 mg/dL  Plan:   Will choose healthy vs unhealthy fats.  Will avoid all trans fats.  Will read labels She will use a takeout plate when she goes to dinner and will take half of her food home so that she can eat it later. She will continue exercise through [employer].  She will start a supplement called [facility] for sleep and memory issues.   Generalized Obesity: Current BMI BMI (Calculated): 26.4    Keyondra is currently in the action stage of change. As such, her goal is to maintain weight for now.  She has agreed to the Category 2 plan.  Exercise goals: Older adults should determine their level  of effort for physical activity relative to their level of fitness.   Behavioral modification strategies: increasing lean protein intake, decreasing simple carbohydrates , increase water intake, better snacking choices, get rid of junk food in the home, avoiding temptations, keep healthy foods in the home, weigh protein portions, measure portion sizes, work on smaller portions, pack lunch for work, and eat on smaller plate.  Angelena has agreed to follow-up with our clinic in 4 weeks.    Objective:   VITALS: Per patient if applicable, see vitals. GENERAL: Alert and in no acute distress. CARDIOPULMONARY: No increased WOB. Speaking in clear sentences.  PSYCH: Pleasant and cooperative. Speech normal rate and rhythm. Affect is appropriate. Insight and judgement are appropriate. Attention is focused, linear, and appropriate.  NEURO: Oriented as arrived to appointment on time with no prompting.   Attestation Statements:   This was prepared with the assistance of Engineer, civil (consulting).  Occasional wrong-word or sound-a-like substitutions may  have occurred due to the inherent limitations of voice recognition   Kirk Peper, DO

## 2024-03-13 ENCOUNTER — Encounter (INDEPENDENT_AMBULATORY_CARE_PROVIDER_SITE_OTHER): Payer: Self-pay | Admitting: Otolaryngology

## 2024-03-13 ENCOUNTER — Ambulatory Visit (INDEPENDENT_AMBULATORY_CARE_PROVIDER_SITE_OTHER): Admitting: Otolaryngology

## 2024-03-13 VITALS — BP 146/79 | HR 77 | Ht 63.0 in | Wt 148.0 lb

## 2024-03-13 DIAGNOSIS — H903 Sensorineural hearing loss, bilateral: Secondary | ICD-10-CM

## 2024-03-13 DIAGNOSIS — R0981 Nasal congestion: Secondary | ICD-10-CM | POA: Diagnosis not present

## 2024-03-13 DIAGNOSIS — H6121 Impacted cerumen, right ear: Secondary | ICD-10-CM

## 2024-03-13 DIAGNOSIS — J310 Chronic rhinitis: Secondary | ICD-10-CM | POA: Diagnosis not present

## 2024-03-13 DIAGNOSIS — R0982 Postnasal drip: Secondary | ICD-10-CM | POA: Diagnosis not present

## 2024-03-13 MED ORDER — IPRATROPIUM BROMIDE 0.06 % NA SOLN: 2.0000 | Freq: Two times a day (BID) | NASAL | 12 refills | Status: AC | PRN

## 2024-03-15 DIAGNOSIS — R0982 Postnasal drip: Secondary | ICD-10-CM | POA: Insufficient documentation

## 2024-03-15 NOTE — Progress Notes (Signed)
 Patient ID: Maureen Elliott, female   DOB: November 18, 1945, 77 y.o.   MRN: [id number]  Follow-up: Hearing loss, chronic eczematous otitis externa New complaint: Postnasal drainage  HPI: The patient is a 78 year old female who returns today for follow-up evaluation.  She was last seen in February 2025.  At that time, she was noted to have bilateral chronic eczematous otitis externa and bilateral sensorineural hearing loss.  She was treated with Elocon cream and hearing amplification.  The patient returns today with a new complaint of chronic postnasal drainage.  She has been experiencing daily nasal drainage for the past 4 months.  She denies any facial pain, fever, or visual change.  She denies any significant change in her hearing.  Exam: General: Communicates without difficulty, well nourished, no acute distress. Head: Normocephalic, no evidence injury, no tenderness, facial buttresses intact without stepoff. Face/sinus: No tenderness to palpation and percussion. Facial movement is normal and symmetric. Eyes: PERRL, EOMI. No scleral icterus, conjunctivae clear. Neuro: CN II exam reveals vision grossly intact.  No nystagmus at any point of gaze. Ears: Auricles well formed without lesions.  Right ear cerumen impaction.  The left ear canal tympanic membrane are normal.  Nose: External evaluation reveals normal support and skin without lesions.  Dorsum is intact.  Anterior rhinoscopy reveals congested mucosa over anterior aspect of inferior turbinates and intact septum.  No purulence noted. Oral:  Oral cavity and oropharynx are intact, symmetric, without erythema or edema.  Mucosa is moist without lesions. Neck: Full range of motion without pain.  There is no significant lymphadenopathy.  No masses palpable.  Thyroid  bed within normal limits to palpation.  Parotid glands and submandibular glands equal bilaterally without mass.  Trachea is midline. Neuro:  CN 2-12 grossly intact.   Procedure: Right ear cerumen  disimpaction Anesthesia: None Description: Under the operating microscope, the cerumen is carefully removed with a combination of cerumen currette, alligator forceps, and suction catheters.  After the cerumen is removed, the TMs are noted to be normal.  No mass, erythema, or lesions. The patient tolerated the procedure well.   Assessment: 1.  Right ear cerumen impaction.  After the disimpaction procedure, both tympanic membranes and middle ear spaces are noted to be normal. 2.  Chronic rhinitis with nasal mucosal congestion and postnasal drainage. 3.  Subjectively stable bilateral sensorineural hearing loss.  Plan: 1.  Otomicroscopy with right ear cerumen disimpaction. 2.  The physical exam findings are reviewed with the patient. 3.  Atrovent  nasal spray 2 sprays each nostril twice daily as needed to treat the nasal drainage. 4.  The patient is encouraged to call with any questions or concerns.

## 2024-04-08 ENCOUNTER — Encounter: Payer: Self-pay | Admitting: Bariatrics

## 2024-04-08 ENCOUNTER — Ambulatory Visit: Admitting: Bariatrics

## 2024-04-08 VITALS — BP 121/73 | HR 69 | Temp 97.9°F | Ht 63.0 in | Wt 149.0 lb

## 2024-04-08 DIAGNOSIS — E7849 Other hyperlipidemia: Secondary | ICD-10-CM

## 2024-04-08 DIAGNOSIS — E669 Obesity, unspecified: Secondary | ICD-10-CM

## 2024-04-08 DIAGNOSIS — Z6826 Body mass index (BMI) 26.0-26.9, adult: Secondary | ICD-10-CM

## 2024-04-08 DIAGNOSIS — E785 Hyperlipidemia, unspecified: Secondary | ICD-10-CM

## 2024-04-08 NOTE — Progress Notes (Signed)
 WEIGHT SUMMARY AND BIOMETRICS  Weight Lost Since Last Visit: 0  Weight Gained Since Last Visit: 0   Vitals Temp: 97.9 F (36.6 C) BP: 121/73 Pulse Rate: 69 SpO2: 98 %   Anthropometric Measurements Height: 5' 3 (1.6 m) Weight: 149 lb (67.6 kg) BMI (Calculated): 26.4 Weight at Last Visit: 149lb Weight Lost Since Last Visit: 0 Weight Gained Since Last Visit: 0 Starting Weight: 171lb Total Weight Loss (lbs): 22 lb (9.979 kg)   Body Composition  Body Fat %: 39.1 % Fat Mass (lbs): 58.4 lbs Muscle Mass (lbs): 86.6 lbs Total Body Water (lbs): 61.8 lbs Visceral Fat Rating : 11   Other Clinical Data Fasting: no Labs: no Today's Visit #: 45 Starting Date: 04/28/20    OBESITY Maureen Elliott is here to discuss her progress with her obesity treatment plan along with follow-up of her obesity related diagnoses.    Nutrition Plan: the Category 2 plan - 0% adherence.  Current exercise: Goes to the gym/Silver sneakers.  Interim History:  Her weight remains the same.  Eating all of the food on the plan., Protein intake is as prescribed, Water intake is adequate., and Denies polyphagia   Pharmacotherapy:  Hunger is moderately controlled.  Cravings are moderately controlled.  Assessment/Plan:   Hyperlipidemia LDL is at goal. Medication(s): Lipitor Cardiovascular risk factors: advanced age (older than 52 for men, 31 for women), dyslipidemia, obesity (BMI >= 30 kg/m2), and sedentary lifestyle  Lab Results  Component Value Date   CHOL 117 10/16/2022   HDL 59 10/16/2022   LDLCALC 47 10/16/2022   TRIG 45 10/16/2022   CHOLHDL 2.0 10/16/2022   Lab Results  Component Value Date   ALT 23 10/16/2022   AST 20 04/17/2022   ALKPHOS 68 04/17/2022   BILITOT 0.4 04/17/2022   The ASCVD Risk score (Arnett DK, et al., 2019) failed to calculate for the following reasons:    The valid total cholesterol range is 130 to 320 mg/dL  Plan:  Continue statin.  Will avoid all trans fats.  Will read labels Information sheet on healthy fats and proteins and discussed. Discussed healthy fats.    Generalized Obesity: Current BMI BMI (Calculated): 26.4   Maureen Elliott is currently in the action stage of change. As such, her goal is to maintain weight for now.  She has agreed to the Category 2 plan.  Exercise goals: Older adults should determine their level of effort for physical activity relative to their level of fitness.   Behavioral modification strategies: increasing lean protein intake, no meal skipping, meal planning , increase water intake, better snacking choices, avoiding temptations, keep healthy foods in the home, weigh protein portions, and mindful eating.  Maureen Elliott has agreed to follow-up with our clinic in 4 weeks.    Objective:   VITALS: Per patient if applicable, see vitals. GENERAL: Alert and in no acute distress. CARDIOPULMONARY:  No increased WOB. Speaking in clear sentences.  PSYCH: Pleasant and cooperative. Speech normal rate and rhythm. Affect is appropriate. Insight and judgement are appropriate. Attention is focused, linear, and appropriate.  NEURO: Oriented as arrived to appointment on time with no prompting.   Attestation Statements:   This was prepared with the assistance of Engineer, civil (consulting).  Occasional wrong-word or sound-a-like substitutions may have occurred due to the inherent limitations of voice recognition   Maureen Daring, DO

## 2024-04-10 DIAGNOSIS — H401121 Primary open-angle glaucoma, left eye, mild stage: Secondary | ICD-10-CM | POA: Diagnosis not present

## 2024-04-10 DIAGNOSIS — H401113 Primary open-angle glaucoma, right eye, severe stage: Secondary | ICD-10-CM | POA: Diagnosis not present

## 2024-04-14 ENCOUNTER — Other Ambulatory Visit: Payer: Self-pay | Admitting: Family Medicine

## 2024-04-14 DIAGNOSIS — Z1231 Encounter for screening mammogram for malignant neoplasm of breast: Secondary | ICD-10-CM

## 2024-04-29 ENCOUNTER — Other Ambulatory Visit: Payer: Self-pay

## 2024-04-29 MED ORDER — XARELTO 20 MG PO TABS: 20.0000 mg | ORAL_TABLET | Freq: Every day | ORAL | 1 refills | Status: AC

## 2024-04-29 NOTE — Telephone Encounter (Signed)
 Prescription refill request for Xarelto  received.  Indication:afib Last office visit:11/24 Weight:67.6  kg Age:78 Scr:0.81  11/24 CrCl:62.07  ml/min  Prescription refilled

## 2024-05-01 ENCOUNTER — Other Ambulatory Visit: Payer: Self-pay | Admitting: Cardiology

## 2024-05-01 MED ORDER — ATORVASTATIN CALCIUM 40 MG PO TABS: 40.0000 mg | ORAL_TABLET | Freq: Every day | ORAL | 0 refills | Status: DC

## 2024-05-06 ENCOUNTER — Ambulatory Visit: Admitting: Bariatrics

## 2024-05-06 ENCOUNTER — Encounter: Payer: Self-pay | Admitting: Bariatrics

## 2024-05-06 VITALS — BP 122/69 | HR 76 | Temp 98.0°F | Ht 63.0 in | Wt 149.0 lb

## 2024-05-06 DIAGNOSIS — E669 Obesity, unspecified: Secondary | ICD-10-CM

## 2024-05-06 DIAGNOSIS — Z6826 Body mass index (BMI) 26.0-26.9, adult: Secondary | ICD-10-CM

## 2024-05-06 DIAGNOSIS — E88819 Insulin resistance, unspecified: Secondary | ICD-10-CM

## 2024-05-06 NOTE — Progress Notes (Signed)
 WEIGHT SUMMARY AND BIOMETRICS  Weight Lost Since Last Visit: 0  Weight Gained Since Last Visit: 0   Vitals Temp: 98 F (36.7 C) BP: 122/69 Pulse Rate: 76 SpO2: 97 %   Anthropometric Measurements Height: 5' 3 (1.6 m) Weight: 149 lb (67.6 kg) BMI (Calculated): 26.4 Weight at Last Visit: 149lb Weight Lost Since Last Visit: 0 Weight Gained Since Last Visit: 0 Starting Weight: 171lb Total Weight Loss (lbs): 22 lb (9.979 kg)   Body Composition  Body Fat %: 38.4 % Fat Mass (lbs): 57.2 lbs Muscle Mass (lbs): 87.4 lbs Total Body Water (lbs): 61.6 lbs Visceral Fat Rating : 11   Other Clinical Data Fasting: no Labs: no Today's Visit #: 46 Starting Date: 04/28/20    OBESITY Maureen Elliott is here to discuss her progress with her obesity treatment plan along with follow-up of her obesity related diagnoses.    Nutrition Plan: the Category 2 plan - 0% adherence.  Current exercise: none  Interim History:  Her weight remains the same.  Eating all of the food on the plan., Protein intake is as prescribed, Is not skipping meals, Water intake is adequate., and Denies polyphagia  Hunger is moderately controlled.  Cravings are moderately controlled.  Assessment/Plan:   Insulin  Resistance Maureen Elliott has had elevated fasting insulin  readings. Goal is HgbA1c < 5.7, fasting insulin  at l0 or less, and preferably at 5.  She denies polyphagia. Medication(s): none Lab Results  Component Value Date   HGBA1C 5.3 10/04/2021   Lab Results  Component Value Date   INSULIN  2.8 11/09/2020   INSULIN  5.1 08/05/2020   INSULIN  7.6 04/28/2020    Plan Will minimize refined carbohydrates ( sweets and starches), and focus more on complex carbohydrates.  Increase the micronutrients found in leafy greens, which include magnesium, polyphenols, and vitamin C which have been postulated to  help with insulin  sensitivity. Minimize fast food and cook more meals at home.  Increase fiber to 25 to 30 grams daily.  We discussed her meals (breakfast, lunch, and dinner).  Recipes given and handouts reviewed.    Generalized Obesity: Current BMI BMI (Calculated): 26.4    Maureen Elliott is currently in the action stage of change. As such, her goal is to maintain weight for now.  She has agreed to the Category 2 plan.  Exercise goals: Older adults should follow the adult guidelines. When older adults cannot meet the adult guidelines, they should be as physically active as their abilities and conditions will allow.   Behavioral modification strategies: increasing lean protein intake, decreasing simple carbohydrates , no meal skipping, meal planning , better snacking choices, planning for success, increasing vegetables, decrease snacking , avoiding temptations, and keep healthy foods in the home.  Maureen Elliott has agreed to follow-up with our clinic in 4 weeks.   Objective:   VITALS: Per patient if applicable, see vitals. GENERAL: Alert and  in no acute distress. CARDIOPULMONARY: No increased WOB. Speaking in clear sentences.  PSYCH: Pleasant and cooperative. Speech normal rate and rhythm. Affect is appropriate. Insight and judgement are appropriate. Attention is focused, linear, and appropriate.  NEURO: Oriented as arrived to appointment on time with no prompting.   Attestation Statements:   This was prepared with the assistance of Engineer, civil (consulting).  Occasional wrong-word or sound-a-like substitutions may have occurred due to the inherent limitations of voice recognition   Clayborne Daring, DO

## 2024-05-07 ENCOUNTER — Ambulatory Visit
Admission: RE | Admit: 2024-05-07 | Discharge: 2024-05-07 | Disposition: A | Discharge status: Home / Self Care | Source: Ambulatory Visit | Attending: Family Medicine | Admitting: Family Medicine

## 2024-05-07 DIAGNOSIS — Z1231 Encounter for screening mammogram for malignant neoplasm of breast: Secondary | ICD-10-CM | POA: Diagnosis not present

## 2024-06-12 ENCOUNTER — Encounter: Payer: Self-pay | Admitting: Bariatrics

## 2024-06-12 ENCOUNTER — Ambulatory Visit: Admitting: Bariatrics

## 2024-06-12 VITALS — BP 123/67 | HR 67 | Temp 97.9°F | Ht 63.0 in | Wt 147.0 lb

## 2024-06-12 DIAGNOSIS — E785 Hyperlipidemia, unspecified: Secondary | ICD-10-CM | POA: Diagnosis not present

## 2024-06-12 DIAGNOSIS — Z6826 Body mass index (BMI) 26.0-26.9, adult: Secondary | ICD-10-CM | POA: Diagnosis not present

## 2024-06-12 DIAGNOSIS — E7800 Pure hypercholesterolemia, unspecified: Secondary | ICD-10-CM | POA: Diagnosis not present

## 2024-06-12 DIAGNOSIS — E538 Deficiency of other specified B group vitamins: Secondary | ICD-10-CM | POA: Diagnosis not present

## 2024-06-12 DIAGNOSIS — E669 Obesity, unspecified: Secondary | ICD-10-CM | POA: Diagnosis not present

## 2024-06-12 DIAGNOSIS — Z0000 Encounter for general adult medical examination without abnormal findings: Secondary | ICD-10-CM

## 2024-06-12 DIAGNOSIS — R7303 Prediabetes: Secondary | ICD-10-CM | POA: Diagnosis not present

## 2024-06-12 NOTE — Progress Notes (Signed)
 WEIGHT SUMMARY AND BIOMETRICS  Weight Lost Since Last Visit: 2lb  Weight Gained Since Last Visit: 0   Vitals Temp: 97.9 F (36.6 C) BP: 123/67 Pulse Rate: 67 SpO2: 98 %   Anthropometric Measurements Height: 5' 3 (1.6 m) Weight: 147 lb (66.7 kg) BMI (Calculated): 26.05 Weight at Last Visit: 149lb Weight Lost Since Last Visit: 2lb Weight Gained Since Last Visit: 0 Starting Weight: 171lb Total Weight Loss (lbs): 24 lb (10.9 kg)   Body Composition  Body Fat %: 39 % Fat Mass (lbs): 57.6 lbs Muscle Mass (lbs): 85.6 lbs Total Body Water (lbs): 60.2 lbs Visceral Fat Rating : 11   Other Clinical Data Fasting: no Labs: no Today's Visit #: 67 Starting Date: 04/28/20    OBESITY Maureen Elliott is here to discuss her progress with her obesity treatment plan along with follow-up of her obesity related diagnoses.    Nutrition Plan: the Category 2 plan - 0% adherence.  Current exercise: none  Interim History: She has been under a lot of stress due to family sickness. She is down 2 lbs since her last visit.  Eating all of the food on the plan., Protein intake is as prescribed, Is not skipping meals, Not journaling consistently., and Water intake is adequate.   Pharmacotherapy: She is not on any anti-obesity medications.  Adverse side effects: None Hunger is moderately controlled.  Cravings are moderately controlled.  Assessment/Plan:   Hyperlipidemia LDL is at goal. Medication(s): Lipitor and Zetia  Cardiovascular risk factors: advanced age (older than 36 for men, 101 for women), dyslipidemia, obesity (BMI >= 30 kg/m2), and sedentary lifestyle  Lab Results  Component Value Date   CHOL 117 10/16/2022   HDL 59 10/16/2022   LDLCALC 47 10/16/2022   TRIG 45 10/16/2022   CHOLHDL 2.0 10/16/2022   Lab Results  Component Value Date   ALT 23 10/16/2022   AST 20  04/17/2022   ALKPHOS 68 04/17/2022   BILITOT 0.4 04/17/2022   The ASCVD Risk score (Arnett DK, et al., 2019) failed to calculate for the following reasons:   The valid total cholesterol range is 130 to 320 mg/dL  Plan:  Continue statin and Zetia .  Will avoid all trans fats.  Will read labels Will minimize saturated fats except the following: low fat meats in moderation, diary, and limited dark chocolate.  Increase Omega 3 in foods, and consider an Omega 3 supplement.  Will weigh self everyday and will self correct if weight is up.   Prediabetes Last A1c was 5.3, but had been up to 5.7 Medication(s): none Lab Results  Component Value Date   HGBA1C 5.3 10/04/2021   HGBA1C 5.7 (H) 03/31/2021   HGBA1C 5.5 11/09/2020   HGBA1C 5.8 (H) 08/05/2020   HGBA1C 5.7 (H) 04/28/2020   Lab Results  Component Value Date   INSULIN  2.8 11/09/2020   INSULIN  5.1 08/05/2020  INSULIN  7.6 04/28/2020    Plan: Will minimize all refined carbohydrates both sweets and starches.  Will work on the plan and exercise.  Consider both aerobic and resistance training.  Will keep protein, water, and fiber intake high.  Increase Polyunsaturated and Monounsaturated fats to increase satiety and encourage weight loss.  She will weigh herself at least once a week and will self-correct by increasing her protein and decreasing her carbohydrates.  Healthcare maintenance:  History of obesity Needs labs  Plan: Will do labs as below.   B 12 deficiency:   She is at risk for B12 deficiency secondary to age and previous dietary habits.  She has also had some memory issues  Plan:  Check B 12 lab today.   Labs done today (CMP, Lipids, HgbA1c, insulin , vitamin D , B 12, and thyroid  panel).    Generalized Obesity: Current BMI BMI (Calculated): 26.05   Maureen Elliott is currently in the action stage of change. As such, her goal is to continue with weight loss efforts.  She has agreed to the Category 2 plan.  Exercise  goals: Older adults should determine their level of effort for physical activity relative to their level of fitness.   Behavioral modification strategies: increasing lean protein intake, no meal skipping, meal planning , increase water intake, better snacking choices, planning for success, increasing vegetables, avoiding temptations, keep healthy foods in the home, and increase frequency of journaling.  Maureen Elliott has agreed to follow-up with our clinic in 4 weeks.   Objective:   VITALS: Per patient if applicable, see vitals. GENERAL: Alert and in no acute distress. CARDIOPULMONARY: No increased WOB. Speaking in clear sentences.  PSYCH: Pleasant and cooperative. Speech normal rate and rhythm. Affect is appropriate. Insight and judgement are appropriate. Attention is focused, linear, and appropriate.  NEURO: Oriented as arrived to appointment on time with no prompting.   Attestation Statements:   This was prepared with the assistance of Engineer, civil (consulting).  Occasional wrong-word or sound-a-like substitutions may have occurred due to the inherent limitations of voice recognition   Maureen Daring, DO

## 2024-06-13 LAB — HEMOGLOBIN A1C
Est. average glucose Bld gHb Est-mCnc: 117 mg/dL
Hgb A1c MFr Bld: 5.7 % — ABNORMAL HIGH (ref 4.8–5.6)

## 2024-06-13 LAB — INSULIN, RANDOM: INSULIN: 4.1 u[IU]/mL (ref 2.6–24.9)

## 2024-06-13 LAB — COMPREHENSIVE METABOLIC PANEL WITH GFR
ALT: 25 IU/L (ref 0–32)
AST: 28 IU/L (ref 0–40)
Albumin: 4.2 g/dL (ref 3.8–4.8)
Alkaline Phosphatase: 81 IU/L (ref 49–135)
BUN/Creatinine Ratio: 19 (ref 12–28)
BUN: 15 mg/dL (ref 8–27)
Bilirubin Total: 0.4 mg/dL (ref 0.0–1.2)
CO2: 24 mmol/L (ref 20–29)
Calcium: 9.9 mg/dL (ref 8.7–10.3)
Chloride: 102 mmol/L (ref 96–106)
Creatinine, Ser: 0.79 mg/dL (ref 0.57–1.00)
Globulin, Total: 3 g/dL (ref 1.5–4.5)
Glucose: 89 mg/dL (ref 70–99)
Potassium: 4.2 mmol/L (ref 3.5–5.2)
Sodium: 140 mmol/L (ref 134–144)
Total Protein: 7.2 g/dL (ref 6.0–8.5)
eGFR: 77 mL/min/1.73 (ref >59)

## 2024-06-13 LAB — VITAMIN D 25 HYDROXY (VIT D DEFICIENCY, FRACTURES): Vit D, 25-Hydroxy: 48.1 ng/mL (ref 30.0–100.0)

## 2024-06-13 LAB — VITAMIN B12: Vitamin B-12: 1165 pg/mL (ref 232–1245)

## 2024-06-13 LAB — LIPID PANEL WITH LDL/HDL RATIO
Cholesterol, Total: 123 mg/dL (ref 100–199)
HDL: 57 mg/dL (ref >39)
LDL Chol Calc (NIH): 54 mg/dL (ref 0–99)
LDL/HDL Ratio: 0.9 ratio (ref 0.0–3.2)
Triglycerides: 50 mg/dL (ref 0–149)
VLDL Cholesterol Cal: 12 mg/dL (ref 5–40)

## 2024-06-13 LAB — TSH+T4F+T3FREE
Free T4: 1.28 ng/dL (ref 0.82–1.77)
T3, Free: 2.7 pg/mL (ref 2.0–4.4)
TSH: 1.81 u[IU]/mL (ref 0.450–4.500)

## 2024-06-16 ENCOUNTER — Encounter: Payer: Self-pay | Admitting: Bariatrics

## 2024-06-16 DIAGNOSIS — K625 Hemorrhage of anus and rectum: Secondary | ICD-10-CM | POA: Insufficient documentation

## 2024-06-16 DIAGNOSIS — D6859 Other primary thrombophilia: Secondary | ICD-10-CM | POA: Insufficient documentation

## 2024-06-16 DIAGNOSIS — H9110 Presbycusis, unspecified ear: Secondary | ICD-10-CM | POA: Insufficient documentation

## 2024-06-26 DIAGNOSIS — Z6827 Body mass index (BMI) 27.0-27.9, adult: Secondary | ICD-10-CM | POA: Diagnosis not present

## 2024-06-26 DIAGNOSIS — H6120 Impacted cerumen, unspecified ear: Secondary | ICD-10-CM | POA: Diagnosis not present

## 2024-07-10 DIAGNOSIS — H401113 Primary open-angle glaucoma, right eye, severe stage: Secondary | ICD-10-CM | POA: Diagnosis not present

## 2024-07-10 DIAGNOSIS — H401121 Primary open-angle glaucoma, left eye, mild stage: Secondary | ICD-10-CM | POA: Diagnosis not present

## 2024-07-21 ENCOUNTER — Encounter: Payer: Self-pay | Admitting: Bariatrics

## 2024-07-21 ENCOUNTER — Ambulatory Visit: Admitting: Bariatrics

## 2024-07-21 VITALS — BP 115/67 | HR 69 | Temp 97.8°F | Ht 63.0 in | Wt 150.0 lb

## 2024-07-21 DIAGNOSIS — E785 Hyperlipidemia, unspecified: Secondary | ICD-10-CM

## 2024-07-21 DIAGNOSIS — Z6826 Body mass index (BMI) 26.0-26.9, adult: Secondary | ICD-10-CM | POA: Diagnosis not present

## 2024-07-21 DIAGNOSIS — E7849 Other hyperlipidemia: Secondary | ICD-10-CM

## 2024-07-21 DIAGNOSIS — E669 Obesity, unspecified: Secondary | ICD-10-CM

## 2024-07-21 NOTE — Progress Notes (Signed)
 WEIGHT SUMMARY AND BIOMETRICS  Weight Lost Since Last Visit: 0  Weight Gained Since Last Visit: 3lb   Vitals Temp: 97.8 F (36.6 C) BP: 115/67 Pulse Rate: 69 SpO2: 98 %   Anthropometric Measurements Height: 5' 3 (1.6 m) Weight: 150 lb (68 kg) BMI (Calculated): 26.58 Weight at Last Visit: 147lb Weight Lost Since Last Visit: 0 Weight Gained Since Last Visit: 3lb Starting Weight: 171lb Total Weight Loss (lbs): 21 lb (9.526 kg)   Body Composition  Body Fat %: 40.1 % Fat Mass (lbs): 60.4 lbs Muscle Mass (lbs): 85.6 lbs Total Body Water (lbs): 63.6 lbs Visceral Fat Rating : 11   Other Clinical Data Fasting: no Labs: no Today's Visit #: 48 Starting Date: 04/28/20   OBESITY Maureen Elliott is here to discuss her progress with her obesity treatment plan along with follow-up of her obesity related diagnoses.    Nutrition Plan: the Category 2 plan - 100% adherence.  Current exercise: none  Interim History:  She is up 3 lbs since her last visit. The bio-impedence scale shows that her muscle mass is stable. Her total body water is up 3 lbs since her last visit.  Eating all of the food on the plan., Protein intake is as prescribed, Is not skipping meals, and Water intake is adequate.   Pharmacotherapy: Maureen Elliott is on not on any anti-obesity medications.  Hunger is moderately controlled.  Cravings are moderately controlled.  Assessment/Plan:   Hyperlipidemia LDL is at goal. Medication(s): Lipitor, and Zetia  Cardiovascular risk factors: advanced age (older than 74 for men, 57 for women), dyslipidemia, obesity (BMI >= 30 kg/m2), and sedentary lifestyle  Lab Results  Component Value Date   CHOL 123 06/12/2024   HDL 57 06/12/2024   LDLCALC 54 06/12/2024   TRIG 50 06/12/2024   CHOLHDL 2.0 10/16/2022   Lab Results  Component Value Date   ALT 25 06/12/2024   AST  28 06/12/2024   ALKPHOS 81 06/12/2024   BILITOT 0.4 06/12/2024   The ASCVD Risk score (Arnett DK, et al., 2019) failed to calculate for the following reasons:   The valid total cholesterol range is 130 to 320 mg/dL  Plan:  Continue statin.  Will avoid all trans fats.  Will read labels Will minimize saturated fats except the following: low fat meats in moderation, diary, and limited dark chocolate.      Generalized Obesity: Current BMI BMI (Calculated): 26.58   Pharmacotherapy Plan No anti-obesity medications.   Maureen Elliott is currently in the action stage of change. As such, her goal is to continue with weight loss efforts.  She has agreed to the Category 2 plan.  Exercise goals: Older adults should determine their level of effort for physical activity relative to their level of fitness.   Behavioral modification strategies: increasing lean protein intake, decrease eating out, meal planning , increase water intake, better snacking  choices, planning for success, increasing vegetables, ways to avoid boredom eating, keep healthy foods in the home, weigh protein portions, measure portion sizes, and mindful eating.  Maureen Elliott has agreed to follow-up with our clinic in 6 weeks.      Objective:   VITALS: Per patient if applicable, see vitals. GENERAL: Alert and in no acute distress. CARDIOPULMONARY: No increased WOB. Speaking in clear sentences.  PSYCH: Pleasant and cooperative. Speech normal rate and rhythm. Affect is appropriate. Insight and judgement are appropriate. Attention is focused, linear, and appropriate.  NEURO: Oriented as arrived to appointment on time with no prompting.   Attestation Statements:   This was prepared with the assistance of Engineer, Civil (consulting).  Occasional wrong-word or sound-a-like substitutions may have occurred due to the inherent limitations of voice recognition   Clayborne Daring, DO'

## 2024-07-21 NOTE — Progress Notes (Deleted)
                                                                                                              WEIGHT SUMMARY AND BIOMETRICS  No data recorded No data recorded  No data recorded Anthropometric Measurements Height: 5' 3 (1.6 m) Weight at Last Visit: 147lb Starting Weight: 171lb   No data recorded Other Clinical Data Fasting: no Labs: no Today's Visit #: 48 Starting Date: 04/28/20    OBESITY Maureen Elliott is here to discuss her progress with her obesity treatment plan along with follow-up of her obesity related diagnoses.    Nutrition Plan: the Category 1 plan - ***% adherence.  Current exercise: {exercise types:16438}  Interim History:  *** {aabnutritionassessment:29213}   Pharmacotherapy: Maureen Elliott is on {dwwpharmacotherapy:29109} Adverse side effects: {dwwse:29122} Hunger is {EWCONTROLASSESSMENT:24261}.  Cravings are {EWCONTROLASSESSMENT:24261}.  Assessment/Plan:   There are no diagnoses linked to this encounter.    {dwwmorbid:29108::Morbid Obesity}: Current BMI No data recorded  Pharmacotherapy Plan {dwwmed:29123}  {dwwpharmacotherapy:29109}  Maureen Elliott {CHL AMB IS/IS NOT:210130109} currently in the action stage of change. As such, her goal is to {MWMwtloss#1:210800005}.  She has agreed to {dwwsldiets:29085}.  Exercise goals: {MWM EXERCISE RECS:23473}  Behavioral modification strategies: {dwwslwtlossstrategies:29088}.  Maureen Elliott has agreed to follow-up with our clinic in {NUMBER 1-10:22536} weeks.   No orders of the defined types were placed in this encounter.   There are no discontinued medications.   No orders of the defined types were placed in this encounter.     Objective:   VITALS: Per patient if applicable, see vitals. GENERAL: Alert and in no acute distress. CARDIOPULMONARY: No increased WOB. Speaking in clear sentences.  PSYCH: Pleasant and cooperative. Speech normal rate and rhythm. Affect is appropriate. Insight and judgement are  appropriate. Attention is focused, linear, and appropriate.  NEURO: Oriented as arrived to appointment on time with no prompting.   Attestation Statements:   This was prepared with the assistance of Engineer, Civil (consulting).  Occasional wrong-word or sound-a-like substitutions may have occurred due to the inherent limitations of voice recognition

## 2024-07-27 ENCOUNTER — Other Ambulatory Visit: Payer: Self-pay | Admitting: Cardiology

## 2024-08-22 ENCOUNTER — Other Ambulatory Visit: Payer: Self-pay | Admitting: Cardiology

## 2024-09-01 ENCOUNTER — Ambulatory Visit: Admitting: Bariatrics

## 2024-09-04 ENCOUNTER — Other Ambulatory Visit: Payer: Self-pay | Admitting: Cardiology

## 2024-09-16 ENCOUNTER — Other Ambulatory Visit: Payer: Self-pay | Admitting: Cardiology

## 2024-09-16 NOTE — Telephone Encounter (Signed)
 Patient need make overdue appointment with Maureen JONELLE Bihari, MD.

## 2024-09-22 ENCOUNTER — Encounter: Payer: Self-pay | Admitting: Bariatrics

## 2024-09-22 ENCOUNTER — Ambulatory Visit: Admitting: Bariatrics

## 2024-09-22 VITALS — BP 157/70 | HR 77 | Ht 63.0 in | Wt 153.0 lb

## 2024-09-22 DIAGNOSIS — Z6827 Body mass index (BMI) 27.0-27.9, adult: Secondary | ICD-10-CM | POA: Diagnosis not present

## 2024-09-22 DIAGNOSIS — E785 Hyperlipidemia, unspecified: Secondary | ICD-10-CM | POA: Diagnosis not present

## 2024-09-22 DIAGNOSIS — E669 Obesity, unspecified: Secondary | ICD-10-CM

## 2024-09-22 DIAGNOSIS — E7800 Pure hypercholesterolemia, unspecified: Secondary | ICD-10-CM

## 2024-09-22 DIAGNOSIS — F5089 Other specified eating disorder: Secondary | ICD-10-CM | POA: Diagnosis not present

## 2024-09-22 NOTE — Progress Notes (Signed)
 "                                                                                                             WEIGHT SUMMARY AND BIOMETRICS  Weight Lost Since Last Visit: 0  Weight Gained Since Last Visit: 3lb   Vitals BP: (!) 157/70 Pulse Rate: 77 SpO2: 96 %   Anthropometric Measurements Height: 5' 3 (1.6 m) Weight: 153 lb (69.4 kg) BMI (Calculated): 27.11 Weight at Last Visit: 150lb Weight Lost Since Last Visit: 0 Weight Gained Since Last Visit: 3lb Starting Weight: 171lb Total Weight Loss (lbs): 18 lb (8.165 kg)   Body Composition  Body Fat %: 41.2 % Fat Mass (lbs): 63.2 lbs Muscle Mass (lbs): 85.8 lbs Total Body Water (lbs): 64.8 lbs Visceral Fat Rating : 12   Other Clinical Data Fasting: no Labs: no Today's Visit #: 11 Starting Date: 04/28/20    OBESITY Maureen Elliott is here to discuss her progress with her obesity treatment plan along with follow-up of her obesity related diagnoses.   Nutrition Plan: the Category 2 plan - 0% adherence.  Current exercise: none  Interim History:  She is under a lot of stress. Her blood pressure is up today.  She is doing some stress eating.  Eating all of the food on the plan., Protein intake is as prescribed, Is skipping meals, Is not skipping meals, Journaling consistently., and Water intake is adequate.  Pharmacotherapy: Maureen Elliott is on no anti-obesity medications.  Hunger is moderately controlled.  Cravings are moderately controlled.  Assessment/Plan:   Hyperlipidemia LDL is at goal. Medication(s): Zetia , and Lipitor Cardiovascular risk factors: advanced age (older than 40 for men, 50 for women), dyslipidemia, obesity (BMI >= 30 kg/m2), and sedentary lifestyle  Lab Results  Component Value Date   CHOL 123 06/12/2024   HDL 57 06/12/2024   LDLCALC 54 06/12/2024   TRIG 50 06/12/2024   CHOLHDL 2.0 10/16/2022   Lab Results  Component Value Date   ALT 25 06/12/2024   AST 28 06/12/2024   ALKPHOS 81 06/12/2024    BILITOT 0.4 06/12/2024   The ASCVD Risk score (Arnett DK, et al., 2019) failed to calculate for the following reasons:   The valid total cholesterol range is 130 to 320 mg/dL  Plan:  Continue statin.  Will avoid all trans fats.  Will read labels Will minimize saturated fats except the following: low fat meats in moderation, diary, and limited dark chocolate.  Increase Omega 3 in foods.  Discussed journaling on a regular basis and keeping her calories at 1200 and her protein around 80 g/day we discussed that since she likes to eat a large breakfast that she could eat the number of calories that she would have for her lunch for breakfast and vice versa for her lunch. We discussed specific foods and calorie amounts. She will work on ways to decrease her stress and use distraction techniques.  Eating disorder/emotional eating Maureen Elliott has had issues with stress eating, emotional eating, and boredom eating. Currently this is moderately controlled. Overall mood  is stable. Denies suicidal/homicidal ideation. Medication(s): No medications.   Plan:  Discussed social challenges and choosing foods in social situations. Discussed assertive communication skills for coping with social relationships, relapse prevention, and possible challenges ahead and how to overcome. Discussed distractions to curb eating behaviors. Discussed activities to do with one's hands in the evening  She will keep her water intake high to help support any stress eating. She will not miss any meals. She will continue to journal on a regular basis and stick close to her number of calories and protein. She will adhere to a plan about 85 to 95% daily.   Generalized Obesity: Current BMI BMI (Calculated): 27.11   Pharmacotherapy Plan No anti-obesity medications.   Maureen Elliott is currently in the action stage of change. As such, her goal is to continue with weight loss efforts.  She has agreed to the Category 2 plan.  Exercise goals:  Older adults should determine their level of effort for physical activity relative to their level of fitness.   Behavioral modification strategies: increasing lean protein intake, decreasing simple carbohydrates , no meal skipping, meal planning , increase water intake, better snacking choices, planning for success, increasing vegetables, avoiding temptations, keep healthy foods in the home, increase frequency of journaling, weigh protein portions, measure portion sizes, and mindful eating.  Maureen Elliott has agreed to follow-up with our clinic in 4 weeks.    Objective:   VITALS: Per patient if applicable, see vitals. GENERAL: Alert and in no acute distress. CARDIOPULMONARY: No increased WOB. Speaking in clear sentences.  PSYCH: Pleasant and cooperative. Speech normal rate and rhythm. Affect is appropriate. Insight and judgement are appropriate. Attention is focused, linear, and appropriate.  NEURO: Oriented as arrived to appointment on time with no prompting.   Attestation Statements:   This was prepared with the assistance of Engineer, Civil (consulting).  Occasional wrong-word or sound-a-like substitutions may have occurred due to the inherent limitations of voice recognition.   Clayborne Daring, DO    "

## 2024-10-07 ENCOUNTER — Encounter (INDEPENDENT_AMBULATORY_CARE_PROVIDER_SITE_OTHER): Payer: Self-pay | Admitting: Otolaryngology

## 2024-10-07 ENCOUNTER — Ambulatory Visit (INDEPENDENT_AMBULATORY_CARE_PROVIDER_SITE_OTHER): Admitting: Otolaryngology

## 2024-10-07 VITALS — BP 138/76 | HR 75

## 2024-10-07 DIAGNOSIS — K117 Disturbances of salivary secretion: Secondary | ICD-10-CM | POA: Diagnosis not present

## 2024-10-07 MED ORDER — PANTOPRAZOLE SODIUM 40 MG PO TBEC: 40.0000 mg | DELAYED_RELEASE_TABLET | Freq: Every day | ORAL | 0 refills | Status: AC

## 2024-10-07 NOTE — Progress Notes (Signed)
 Reason for Consult: Throat discomfort Referring Physician: Dr. Cleotilde Rock Maureen Elliott is an 79 y.o. female.  HPI: She is here for evaluation of dryness of the throat.  She does not have any pain.  No soreness.  No dysphagia or odynophagia.  A very slight mucus thickness occasionally.  She has no change in her voice.  She is only here because she thought the dryness might be a sign of something more concerning.  She does take a significant number of glaucoma eyedrops.  She does have some reflux symptoms.  No significant nasal symptoms.  Past Medical History:  Diagnosis Date   CAD (coronary artery disease), native coronary artery    minimal < 25% plaque LAD by coronary CTA 2/23   Complication of anesthesia    be careful with intubation due to previous cervical neck surgery   Cystocele    Diverticulitis    Diverticulosis    DVT (deep venous thrombosis) (HCC)    left leg   Gallbladder problem    Gestational diabetes    Glaucoma    Hemorrhoids internal and external   High cholesterol    History of gestational diabetes yrs ago   Joint pain    Lactose intolerance    Nocturia    Osteoarthritis    PAF (paroxysmal atrial fibrillation) (HCC)    Partial hearing loss    PONV (postoperative nausea and vomiting) 2013   n/v after phenergan , injectable phenergan  helps with nausea   Prediabetes     Past Surgical History:  Procedure Laterality Date   ABDOMINAL HYSTERECTOMY  2013   ovaries removed    BLADDER SURGERY     BREAST BIOPSY Bilateral 2001   cervical fusion C 5, C 6 and C 7  2004   colonscopy  feb 2015   blood in stool, just was hemorrhoids   CYSTOCELE REPAIR N/A 01/13/2014   Procedure: CYSTOSCOPY, REPAIR CYSTOCELE VAULT PROLAPSE/GRAFT;  Surgeon: Glendia DELENA Elizabeth, MD;  Location: [facility];  Service: Urology;  Laterality: N/A;   EYE SURGERY Bilateral 8 yrs ago   lens replacement for cataract   GLAUCOMA SURGERY     SEPTOPLASTY Bilateral 03/01/2020   Procedure: NASAL SEPTOPLASTY;   Surgeon: Karis Clunes, MD;  Location: [facility];  Service: ENT;  Laterality: Bilateral;   tummy tuck with mesh  6 yrs ago   large abdominal is present    Family History  Problem Relation Age of Onset   Breast cancer Maternal Aunt    Breast cancer Maternal Aunt    Breast cancer Maternal Aunt    Cancer Mother    Hypertension Father    Cancer Father    Alcohol abuse Father    Obesity Father     Social History:  reports that she has never smoked. She has never used smokeless tobacco. She reports current alcohol use. She reports that she does not use drugs.  Allergies: Allergies[1]   No results found for this or any previous visit (from the past 48 hours).  No results found.  ROS There were no vitals taken for this visit. Physical Exam Constitutional:      Appearance: Normal appearance.  HENT:     Head: Normocephalic and atraumatic.     Right Ear: Tympanic membrane is without lesions and middle ear aerated, ear canal and external ear normal.     Left Ear: Tympanic membrane is without lesions and middle ear aerated, ear canal and external ear normal.  Nose: Nose without deviation of septum.  Turbinates with mild hypertrophy, No significant swelling or masses.     Oral cavity/oropharynx: Mucous membranes are moist. No lesions or masses    Larynx: normal voice. Mirror attempted without success    Eyes:     Extraocular Movements: Extraocular movements intact.     Conjunctiva/sclera: Conjunctivae normal.     Pupils: Pupils are equal, round, and reactive to light.  Cardiovascular:     Rate and Rhythm: Normal rate.  Pulmonary:     Effort: Pulmonary effort is normal.  Musculoskeletal:     Cervical back: Normal range of motion and neck supple. No rigidity.  Lymphadenopathy:     Cervical: No cervical adenopathy or masses.salivary glands without lesions. .     Salivary glands- no mass or swelling Neurological:     Mental Status: He is alert. CN 2-12 intact. 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  I suspect this is secondary to her medications.  It could be some reflux exacerbation and she we will try Protonix  once a day just for a trial.  Also she will try Biotene.  She will need to see her ophthalmologist regarding the medications that may be given her this side effect.  Right now she does not want a workup Sjogren's possibility.  [name] 10/07/2024, 2:44 PM        [1]  Allergies Allergen Reactions   Amoxicillin Diarrhea    Normal adverse effect   Attapulgite Diarrhea   Caffeine Other (See Comments)    Avoid due to glaucoma   Dorzolamide Other (See Comments)    Severe eye irritation   Fish Oil     Other reaction(s): due to xarelto    Keflex [Cephalexin] Diarrhea    Normal adverse effect   Levaquin [Levofloxacin] Other (See Comments)    Joint pain and stiffness   Maxitrol [Neomycin-Polymyxin-Dexameth] Other (See Comments)    Burns eyes   Methotrexate And Trimetrexate Other (See Comments)    Hearing damage   Nsaids     Other reaction(s): due to being on Xarelto    Prednisone Other (See Comments)    Avoid due to glaucoma   Restasis [Cyclosporine] Other (See Comments)    Burns eyes   Rofecoxib     Other reaction(s): Heart palpitations / PVC's   Valtrex [Valacyclovir] Swelling and Other (See Comments)    Face swelling, headache on left side, increased eye pressure

## 2024-10-20 ENCOUNTER — Ambulatory Visit: Admitting: Bariatrics

## 2024-10-30 ENCOUNTER — Other Ambulatory Visit (INDEPENDENT_AMBULATORY_CARE_PROVIDER_SITE_OTHER): Payer: Self-pay | Admitting: Otolaryngology

## 2024-11-06 ENCOUNTER — Ambulatory Visit: Admitting: Bariatrics
